# Patient Record
Sex: Female | Born: 1945 | Race: White | Hispanic: No | Smoking: Never smoker
Health system: Southern US, Community
[De-identification: ages and names within clinical notes are randomized; demographics above are authoritative.]

## PROBLEM LIST (undated history)

## (undated) DIAGNOSIS — N189 Chronic kidney disease, unspecified: Secondary | ICD-10-CM

## (undated) DIAGNOSIS — D333 Benign neoplasm of cranial nerves: Secondary | ICD-10-CM

## (undated) DIAGNOSIS — D496 Neoplasm of unspecified behavior of brain: Secondary | ICD-10-CM

## (undated) DIAGNOSIS — I10 Essential (primary) hypertension: Secondary | ICD-10-CM

## (undated) DIAGNOSIS — M810 Age-related osteoporosis without current pathological fracture: Secondary | ICD-10-CM

## (undated) DIAGNOSIS — K219 Gastro-esophageal reflux disease without esophagitis: Secondary | ICD-10-CM

## (undated) HISTORY — PX: FRACTURE SURGERY: SHX138

---
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
## 2005-09-18 ENCOUNTER — Ambulatory Visit: Payer: Self-pay | Admitting: Nurse Practitioner

## 2006-08-29 ENCOUNTER — Ambulatory Visit: Payer: Self-pay

## 2008-06-12 ENCOUNTER — Emergency Department: Payer: Self-pay | Admitting: Emergency Medicine

## 2009-04-16 ENCOUNTER — Ambulatory Visit: Payer: Self-pay | Admitting: Internal Medicine

## 2009-05-08 ENCOUNTER — Inpatient Hospital Stay: Payer: Self-pay | Admitting: Internal Medicine

## 2009-05-17 ENCOUNTER — Ambulatory Visit: Payer: Self-pay | Admitting: Internal Medicine

## 2009-05-26 DIAGNOSIS — D473 Essential (hemorrhagic) thrombocythemia: Secondary | ICD-10-CM | POA: Insufficient documentation

## 2009-05-26 HISTORY — DX: Essential (hemorrhagic) thrombocythemia: D47.3

## 2009-08-17 ENCOUNTER — Ambulatory Visit: Payer: Self-pay | Admitting: Internal Medicine

## 2009-08-18 ENCOUNTER — Ambulatory Visit: Payer: Self-pay | Admitting: Internal Medicine

## 2009-09-14 ENCOUNTER — Ambulatory Visit: Payer: Self-pay | Admitting: Internal Medicine

## 2011-12-15 DIAGNOSIS — H9190 Unspecified hearing loss, unspecified ear: Secondary | ICD-10-CM

## 2011-12-15 HISTORY — DX: Unspecified hearing loss, unspecified ear: H91.90

## 2011-12-23 LAB — TROPONIN I: Troponin-I: <0.02 ng/mL

## 2011-12-23 LAB — URINALYSIS, COMPLETE
Bilirubin,UR: NEGATIVE
Glucose,UR: NEGATIVE mg/dL (ref 0–75)
Hyaline Cast: 9
Leukocyte Esterase: NEGATIVE
Nitrite: NEGATIVE
Ph: 5 (ref 4.5–8.0)
Protein: 30
RBC,UR: 1 /HPF (ref 0–5)

## 2011-12-23 LAB — COMPREHENSIVE METABOLIC PANEL
Albumin: 4.2 g/dL (ref 3.4–5.0)
Alkaline Phosphatase: 138 U/L — ABNORMAL HIGH (ref 50–136)
Anion Gap: 11 (ref 7–16)
Bilirubin,Total: 0.4 mg/dL (ref 0.2–1.0)
EGFR (African American): 29 — ABNORMAL LOW
EGFR (Non-African Amer.): 25 — ABNORMAL LOW
Glucose: 112 mg/dL — ABNORMAL HIGH (ref 65–99)
Osmolality: 257 (ref 275–301)
Potassium: 4.5 mmol/L (ref 3.5–5.1)
Total Protein: 8.1 g/dL (ref 6.4–8.2)

## 2011-12-23 LAB — CBC
HCT: 43.2 % (ref 35.0–47.0)
MCHC: 33 g/dL (ref 32.0–36.0)
MCV: 89 fL (ref 80–100)
Platelet: 206 10*3/uL (ref 150–440)
RBC: 4.88 10*6/uL (ref 3.80–5.20)
RDW: 14.4 % (ref 11.5–14.5)
WBC: 5.1 10*3/uL (ref 3.6–11.0)

## 2011-12-23 LAB — PROTIME-INR
INR: 4.5
Prothrombin Time: 42.3 secs — ABNORMAL HIGH (ref 11.5–14.7)

## 2011-12-24 ENCOUNTER — Observation Stay: Payer: Self-pay | Admitting: Internal Medicine

## 2011-12-24 LAB — COMPREHENSIVE METABOLIC PANEL
Albumin: 3.4 g/dL (ref 3.4–5.0)
Alkaline Phosphatase: 103 U/L (ref 50–136)
Anion Gap: 13 (ref 7–16)
BUN: 23 mg/dL — ABNORMAL HIGH (ref 7–18)
Bilirubin,Total: 0.4 mg/dL (ref 0.2–1.0)
Calcium, Total: 8.3 mg/dL — ABNORMAL LOW (ref 8.5–10.1)
Co2: 17 mmol/L — ABNORMAL LOW (ref 21–32)
EGFR (African American): 39 — ABNORMAL LOW
EGFR (Non-African Amer.): 34 — ABNORMAL LOW
Glucose: 73 mg/dL (ref 65–99)
Osmolality: 271 (ref 275–301)
Potassium: 4 mmol/L (ref 3.5–5.1)
SGOT(AST): 31 U/L (ref 15–37)
Sodium: 134 mmol/L — ABNORMAL LOW (ref 136–145)

## 2011-12-24 LAB — AMMONIA: Ammonia, Plasma: <25 mcmol/L (ref 11–32)

## 2011-12-24 LAB — TROPONIN I
Troponin-I: <0.02 ng/mL
Troponin-I: <0.02 ng/mL

## 2011-12-24 LAB — CK TOTAL AND CKMB (NOT AT ARMC)
CK, Total: 290 U/L — ABNORMAL HIGH (ref 21–215)
CK, Total: 301 U/L — ABNORMAL HIGH (ref 21–215)
CK-MB: 8.2 ng/mL — ABNORMAL HIGH (ref 0.5–3.6)
CK-MB: 8.2 ng/mL — ABNORMAL HIGH (ref 0.5–3.6)

## 2011-12-24 LAB — CBC WITH DIFFERENTIAL/PLATELET
Eosinophil #: 0.1 10*3/uL (ref 0.0–0.7)
Eosinophil %: 1.4 %
HCT: 37.7 % (ref 35.0–47.0)
HGB: 12.7 g/dL (ref 12.0–16.0)
Lymphocyte #: 1.4 10*3/uL (ref 1.0–3.6)
MCH: 30.1 pg (ref 26.0–34.0)
MCHC: 33.7 g/dL (ref 32.0–36.0)
Monocyte #: 0.3 x10 3/mm (ref 0.2–0.9)
Neutrophil #: 2.2 10*3/uL (ref 1.4–6.5)
Neutrophil %: 54.4 %
WBC: 4 10*3/uL (ref 3.6–11.0)

## 2011-12-24 LAB — APTT: Activated PTT: 58.5 secs — ABNORMAL HIGH (ref 23.6–35.9)

## 2011-12-24 LAB — PROTIME-INR
INR: 4.8
Prothrombin Time: 44.9 secs — ABNORMAL HIGH (ref 11.5–14.7)

## 2011-12-24 LAB — MAGNESIUM: Magnesium: 1.7 mg/dL — ABNORMAL LOW

## 2011-12-24 LAB — ETHANOL
Ethanol %: <0.003 % (ref 0.000–0.080)
Ethanol: <3 mg/dL

## 2011-12-24 LAB — TSH: Thyroid Stimulating Horm: 1.44 u[IU]/mL

## 2011-12-25 LAB — BASIC METABOLIC PANEL
BUN: 13 mg/dL (ref 7–18)
Chloride: 108 mmol/L — ABNORMAL HIGH (ref 98–107)
Co2: 21 mmol/L (ref 21–32)
EGFR (African American): 51 — ABNORMAL LOW
EGFR (Non-African Amer.): 44 — ABNORMAL LOW
Glucose: 78 mg/dL (ref 65–99)
Osmolality: 271 (ref 275–301)
Potassium: 4.1 mmol/L (ref 3.5–5.1)
Sodium: 136 mmol/L (ref 136–145)

## 2011-12-25 LAB — AMMONIA: Ammonia, Plasma: <25 mcmol/L (ref 11–32)

## 2011-12-25 LAB — CBC WITH DIFFERENTIAL/PLATELET
Basophil #: 0 10*3/uL (ref 0.0–0.1)
Eosinophil %: 1.6 %
Lymphocyte #: 1.4 10*3/uL (ref 1.0–3.6)
Lymphocyte %: 34.4 %
MCH: 29.6 pg (ref 26.0–34.0)
Monocyte #: 0.3 x10 3/mm (ref 0.2–0.9)
Monocyte %: 8.5 %
Neutrophil #: 2.2 10*3/uL (ref 1.4–6.5)
Neutrophil %: 54.3 %
Platelet: 169 10*3/uL (ref 150–440)
RDW: 14.3 % (ref 11.5–14.5)
WBC: 4 10*3/uL (ref 3.6–11.0)

## 2011-12-25 LAB — PROTIME-INR: INR: 1.3

## 2011-12-25 LAB — CK-MB: CK-MB: 4.9 ng/mL — ABNORMAL HIGH (ref 0.5–3.6)

## 2011-12-27 LAB — PROTEIN ELECTROPHORESIS(ARMC)

## 2012-01-17 DIAGNOSIS — K7460 Unspecified cirrhosis of liver: Secondary | ICD-10-CM | POA: Insufficient documentation

## 2012-01-17 HISTORY — DX: Unspecified cirrhosis of liver: K74.60

## 2012-12-31 DIAGNOSIS — K5900 Constipation, unspecified: Secondary | ICD-10-CM | POA: Insufficient documentation

## 2012-12-31 HISTORY — DX: Constipation, unspecified: K59.00

## 2013-01-27 ENCOUNTER — Ambulatory Visit: Payer: Self-pay | Admitting: Family Medicine

## 2013-05-29 DIAGNOSIS — J309 Allergic rhinitis, unspecified: Secondary | ICD-10-CM

## 2013-05-29 HISTORY — DX: Allergic rhinitis, unspecified: J30.9

## 2013-07-18 DIAGNOSIS — E871 Hypo-osmolality and hyponatremia: Secondary | ICD-10-CM

## 2013-07-18 HISTORY — DX: Hypo-osmolality and hyponatremia: E87.1

## 2013-10-21 DIAGNOSIS — M5489 Other dorsalgia: Secondary | ICD-10-CM

## 2013-10-21 HISTORY — DX: Other dorsalgia: M54.89

## 2014-05-07 DIAGNOSIS — R195 Other fecal abnormalities: Secondary | ICD-10-CM

## 2014-05-07 HISTORY — DX: Other fecal abnormalities: R19.5

## 2014-11-08 NOTE — H&P (Signed)
PATIENT NAME:  Alexis Nichols, Alexis Nichols MR#:  [id number] DATE OF BIRTH:  [birth year]  DATE OF ADMISSION:  12/24/2011  PRIMARY CARE PHYSICIAN: [facility]   GASTROENTEROLOGIST: Gaylyn Cheers, MD  ED REFERRING PHYSICIAN: Lenise Arena, MD  HISTORY OF PRESENT ILLNESS: Mrs. Alexis Nichols is a 69 year old female with past medical history significant for hypothyroidism, liver cirrhosis, chronic kidney disease stage III, and history of contrast-induced nephropathy, who presents with complaint of lethargy and nausea for the past couple of weeks and was told to come to the emergency department by her primary care physician.  History is per the patient and her daughter who is at the bedside. The patient states that over the past couple of weeks she has been feeling a little tired, weak, and lethargic. She made an appointment to see her primary care physician on 12/15/2011 at the [facility]. She had a BMP and CBC drawn there. She was called earlier in the week and told that her kidney function was abnormal and she should come in to get a referral for a kidney doctor or either go to the ED due to her acute rise in her kidney function.  She did not tell her family members that she was supposed to go and see another doctor, in regards to her kidneys, and today she just continued to feel fatigued and lethargic at which point she told her daughter who decided to bring her in to the hospital. Upon arrival to the hospital, the patient was noted to be mentating fine. She got 2 liters of IV fluids. Her creatinine is noted to be elevated from 1.3 to 2.0, BUN is at 30, and she had elevation in her PT and PTT, which is abnormal for her.  She does have a history of liver cirrhosis and was seen in the past with elevated CA-125 and seen in the past by Dr. Vira Agar and Dr. Concepcion Elk. Hospitalist services were consulted for further inpatient workup and management. Her primary care physician is at the [facility]. Her primary GI physician is Dr.  Vira Agar.   PAST MEDICAL HISTORY:  1. Liver cirrhosis.  2. Hypothyroidism.  3. Tobacco abuse.  4. Alcohol abuse.  5. Chronic kidney disease. 6. Thrombocytopenia.   HOME MEDICATIONS:  1. Amoxicillin 500 mg started on 12/15/2011 by her PMD, one tablet three times daily.  2. Calcium plus vitamin D tablets, 2 tablets by mouth daily. 3. AcipHex 20 mg one tablet by mouth twice a day for acid reflux. 4. Triamcinolone acetonide 0.1% cream applied to affected area twice a day as needed.  PAST SURGICAL HISTORY: Left mole resection in approximately 2010.  DRUG ALLERGIES: No known drug allergies.   FAMILY HISTORY: Mother had uterine cancer, history of gastric ulcer. Father had history of asthma.   SOCIAL HISTORY: Quit smoking about 3 years ago, smoked a pack a day for about 40 years. Admits to drinking hard liquor, quit about 3 years ago and drank for a number of years.   REVIEW OF SYSTEMS: CONSTITUTIONAL: Denies any fevers. Admits of fatigue and weakness. No pain, weight loss, or weight gain. EYES: No blurred vision, double vision, pain, or redness. ENT: No tinnitus, ear pain, hearing loss, seasonal allergies, or epistaxis. RESPIRATORY: No cough, wheeze, hemoptysis, or dyspnea. CARDIOVASCULAR: No chest pain, orthopnea, edema, arrhythmias, or dyspnea on exertion. GASTROINTESTINAL: Positive nausea. No vomiting, diarrhea, abdominal pain, hematemesis, melena, or ulcers. Positive gastroesophageal reflux disease. GENITOURINARY: No dysuria or hematuria or renal calculi. ENDOCRINE: No polyuria or nocturia, History of  hypothyroidism. HEME/LYMPH: No anemia, easy bruising. Has low platelets. INTEGUMENT: No acne, rash, lesions, or change in mole, hair or skin. MUSCULOSKELETAL: No pain in the neck, shoulders, knees, or hips. Some mild back pain she complained of, otherwise no arthritis, cramps, swelling, or redness. NEURO: No numbness, weakness, dysarthria, tremor, vertigo, or ataxia. PSYCH: No anxiety, insomnia,  ADD, OCD, bipolar, or depression.   PHYSICAL EXAMINATION:   VITALS: Temperature 98.9, blood pressure 124/48, respirations 18, pulse 76, pulse oximetry 97% on room air.  GENERAL APPEARANCE: Well-developed female lying in bed, in no acute respiratory distress.   HEENT: Pupils are equally round and reactive to light. Extraocular movements are intact. No scleral icterus or conjunctivitis. No difficulty hearing. Mild dry mucous membranes. Otherwise good dentition.   NECK: No thyroid enlargement or nodules. Nontender thyroid. Neck is supple, nontender. No masses are palpated. No JVD.   LUNGS: Clear to auscultation bilaterally. No rales, rhonchi, or crackles. No diminished breath sounds.   HEART: Regular rate and regular rhythm. S1 and S2 auscultated. No PMI lateralization. No lower extremity edema. Good pedal pulses are noted. Chest is nontender.  ABDOMEN: Soft and nontender and nondistended. Positive bowel sounds. No hepatosplenomegaly palpated.   MUSCULOSKELETAL: 5/5 strength in bilateral upper and lower extremities. No cyanosis.   SKIN: No rash, lesions, erythema, or nodules. Skin is warm and dry.   LYMPH: No adenopathy noted in the cervical, axillary, or supraclavicular regions.   NEURO: Cranial nerves II through XII grossly intact. Deep tendon reflexes intact. Sensory is intact. She does have bilateral hearing deficit.   PSYCH: Alert and oriented to person, time, and place. Cooperative. Good judgment noted.   LABS/RADIOLOGIC STUDIES: Sodium 124, potassium 4.5, chloride 96, bicarbonate 17, BUN 30, creatinine 2.04, glucose 112. Estimated GFR 25.  Osmolality 257. Calcium 8.1. AST 37, ALT 20, alkaline phosphatase 138. White blood cell count 5.1, hemoglobin 14.3, hematocrit 43.2, platelet count 206. PT 42.3. INR 4.5.   Urinalysis is essentially negative.   CT of the head done for lethargy and altered mental status showed no acute intracranial process.  Chest x-ray showed no acute disease  of the chest.   EKG is pending.   ASSESSMENT AND PLAN: A 69 year old female with past medical history of chronic kidney disease stage III, hypothyroidism, liver cirrhosis, tobacco abuse, and alcohol abuse admitted for lethargy, mild mental status changes, and acute renal failure.  1. Acute renal failure. Differential diagnoses include dehydration, malnutrition, and possible alcohol abuse again. The patient has acute on chronic kidney disease, stage III. She does have a history of contrast-induced nephropathy about two years ago. Her creatinine did recover and she did have a baseline creatinine two years ago of about 1.3; however, it is elevated now to about 2.0. Again, the differential diagnosis includes dehydration and malnutrition. She did get 2 liters of normal saline in the ED. We will followup CMP in the morning and check a renal ultrasound. Also consult nephrology. We will start her on a regular diet and encourage p.o. intake.  2. Hyponatremia. This is acute on chronic. Based on review of her labs that we have here in the hospital, her trend in sodium is normal year around, 128 to 130. Currently it is at 124. Again, this is most likely secondary to malnutrition or possible reintroduction of alcohol. We will check an alcohol level and we will also check an abdominal ultrasound to monitor her liver status.  3. History of liver cirrhosis, per records, secondary to alcohol abuse in combination  with possible hepatitis B. We will check a CMP in the morning, followup with an abdominal ultrasound, and check Coags in the a.m.   4. History of hypothyroidism. Per home medication list, she is on Synthroid 100 mcg, but she has not taken it. We will check a TSH and a free T4. If there is abnormalities in her thyroid function tests, we will restart her on Synthroid. This could also be adding to her lethargy and her lab abnormalities.  5. Elevated INR. Again, this could be worsening liver disease causing the  coagulopathy that we are seeing and the elevated INR and PT. INR two years ago, when she was admitted to the hospital, was 0.9, in October 2010, and is now 4.5, which is about four times above her baseline limit. We will check another INR in the morning and consult gastroenterology also.   TIME SPENT DICTATING AND EVALUATING PATIENT: 45 minutes.  ____________________________ Vilinda Boehringer, MD vm:slb D: 12/24/2011 00:23:08 ET     T: 12/24/2011 11:45:12 ET        JOB#: [id number] cc: [facility] Vilinda Boehringer MD ELECTRONICALLY SIGNED 12/24/2011 14:15

## 2014-11-08 NOTE — Consult Note (Signed)
PATIENT NAME:  Alexis Nichols, INSCORE MR#:  [id number] DATE OF BIRTH:  14-Mar-1946  DATE OF CONSULTATION:  12/24/2011  REFERRING PHYSICIAN:   CONSULTING PHYSICIAN:  Manya Silvas, MD  HISTORY OF PRESENT ILLNESS: The patient is a 69 year old white female with a history of many years of alcoholism up until three years ago when she was admitted to the hospital and she has not drank any alcohol since then, according to the patient and her daughter. The patient currently lives by herself. She came to the ER after going to [facility] where she was complaining of feeling weak and lethargic and she had a serum sodium of 124. She was sent to the ER and after evaluation she was admitted to the hospital and I was asked to see her in consultation.   The patient has a history of previous hepatitis B with probably resolution of the disease. Records are unclear about this.   She has a history of esophageal stricture with dilatation with great success for swallowing, according to the patient. She is starting to have some swallowing difficulty again and feels like she needs her esophagus stretched again.   For screening, she has never had a colonoscopy.   The patient has a history of cirrhosis of the liver based on previous ultrasound and CAT scan. On previous endoscopy she had portal hypertensive gastropathy.   PAST MEDICAL HISTORY:  1. Hypothyroidism. 2. Tobacco abuse. 3. Alcohol abuse.  4. Chronic kidney disease.  5. Thrombocytopenia secondary to cirrhosis.   HOME MEDICATIONS:  1. AcipHex 20 mg twice a day for reflux.  2. Amoxicillin 500 mg 1 tablet three times daily, started on 12/15/2011, should be finished now.   PAST SURGICAL HISTORY: Left mole resection in 2010.  ALLERGIES: No known drug allergies.   FAMILY HISTORY: Mother with uterine cancer, history of gastric ulcer. Father with asthma.   HABITS: She quit smoking three years ago when she stopped drinking alcohol. She smoked a pack a day for  almost 40 years.  REVIEW OF SYSTEMS: Weakness and fatigue. Lethargy. Nausea. Gastroesophageal reflux disease and dysphagia. No hematemesis. No vomiting. No melena. No diarrhea. She does tend to be a little constipated. No exertional chest pains or skipping irregular heartbeats.   PHYSICAL EXAMINATION:   GENERAL: Elderly white female who is hard of hearing sitting up in bed eating lunch.  VITAL SIGNS: Temperature 98.1, pulse 66, blood pressure 111/65, and oxygen saturation 97%.   HEENT: Sclerae anicteric. Conjunctivae negative. Tongue negative. Head is atraumatic.   LUNGS: Chest is clear.   HEART: 1/6 systolic murmur.   ABDOMEN: Negative.   EXTREMITIES: No edema.   SKIN: Warm and dry.   PSYCH: Mood and affect are appropriate.  LABS/RADIOLOGIC STUDIES: Sodium 124, potassium 4.5, chloride 96, bicarbonate 17, BUN 30, creatinine 2, calcium 8.1. AST 37, ALT 20. White blood count 5.1, hemoglobin 14. PT 42.3 seconds with an INR of 4.5.   CT of the head done for lethargy and altered mental status showed no acute intracranial process.   Chest x-ray showed no disease.   ASSESSMENT/RECOMMENDATIONS: Hyponatremia and low bicarbonate with mild renal insufficiency. 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 She surprisingly has a normal liver panel yet a very elevated pro time. Her daughter and the patient both say that the patient has not resumed alcohol and she is supposedly not on any strange diet.   I have asked the daughter to go to the patient's clinic, at  [facility], and get a printout of her medications to be sure she was not started on Coumadin that we are not aware of. I also asked her to bring prescription bottles if at all possible.   For her previous hepatitis B, we will get a hepatitis B panel and be sure that she has cleared her surface antigen.   I will follow with you. Eventually she will need a repeat upper endoscopy and a  screening colonoscopy.  ____________________________ Manya Silvas, MD rte:slb D: 12/24/2011 13:07:44 ET T: 12/24/2011 13:50:54 ET JOB#: [id number]  cc: Manya Silvas, MD, <Dictator> Manya Silvas MD ELECTRONICALLY SIGNED 01/09/2012 16:06

## 2014-11-08 NOTE — Discharge Summary (Signed)
PATIENT NAME:  Alexis Nichols, Alexis Nichols MR#:  [id number] DATE OF BIRTH:  09-Jan-1946  DATE OF ADMISSION:  12/24/2011 DATE OF DISCHARGE:  12/25/2011  ADMITTING DIAGNOSES:  1. Generalized weakness. 2. Acute renal failure.   DISCHARGE DIAGNOSES:  1. Generalized weakness likely in the setting of hyponatremia and acute renal failure, due to volume depletion, now the patient's symptoms are improved.  2. Acute renal failure, on chronic renal failure, likely due to volume depletion. Now her renal function is improved. GFR is chronically low.  3. Hyponatremia. This was acute on chronic. The patient's sodium usually is around 129 now is normalized. 4. History of liver cirrhosis secondary to alcohol abuse in the past, none now.  5. History of hypothyroidism, not on any supplements. TSH is normal. She was taken off her medications because of thyroid function being normal.  6. Elevated INR, possibly related to lab error or could be related to dehydration, also could be related to vitamin K deficiency status post one dose of vitamin K with normalization of her INR.  7. Elevated CK-MB and CPK without any chest pain, status post echocardiogram that shows no evidence of wall motion abnormality. The patient is without any chest pain.  8. History of thrombocytopenia. Platelets during this hospitalization were normal.  9. Status post left mole resection in 2010.   CONSULTANTS: Gaylyn Cheers, MD - Gastroenterology.  PERTINENT LABS/EVALUATIONS: Sodium 124, potassium 4.5, chloride 96, bicarbonate 17, BUN 30, creatinine 2.04, glucose 112, calcium 8.1, AST 20, and alkaline phosphatase 138. WBC count 5.1, hemoglobin 14.3, hematocrit 43.2, and platelet count 206. INR 4.1.   Urinalysis was negative.   CT scan of the head showed no acute abnormality.   Chest x-ray showed no acute abnormality.   BUN and creatinine today are 13 and 1.27, sodium 136, chloride 108, and GFR 44. INR this morning is 1.3. Her CPK was 290, 301, 295,  and 205. CK-MB was 8.2, 9.9, and 8.2 and then subsequently 4.9. Also troponin was less than 0.02 x3. TSH 1.44. Free thyroxine 0.84.   Echocardiogram showed normal ejection fraction, poor quality study. There was no evidence of wall motion abnormality. Ejection fraction was normal.  Ultrasound of the abdomen showed liver is slightly heterogeneous in echotexture with increased echogenicity.   HOSPITAL COURSE: Please see the history and physical done by the admitting physician. The patient is a 69 year old white female with medical history significant for hypothyroidism, liver cirrhosis, chronic kidney disease stage III, and history of contrast-induced nephropathy who presented with complaint of lethargy and nausea for the past three weeks. The patient was noted to have an elevated creatinine, was noted to have hyponatremia, also had some other abnormalities including an elevated INR. Due to these symptoms, we were asked to admit the patient. The patient was given IV fluids. Nephrology consult was obtained. A renal ultrasound was negative. Her renal function normalized with IV fluids. Her sodium was also low. That also normalized with normal saline. The patient is feeling much better, ambulated well with physical therapy. At this point, she wants to go home. She is stable for discharge. She was also noted to have an elevated INR and initially felt that this could be related to her liver disease, however, her platelet count was normal. She was given a dose of vitamin K with her INR being 1.3, so unlikely related to her liver due to such quick correction. The patient also was noted to have an elevated CK-MB without any chest pain. She had an echo  which was nonrevealing. If she starts having chest pain, she does need cardiac evaluation with stress test. At this time, she is doing much better and is stable for discharge.   DISCHARGE MEDICATIONS:  1. Calcium plus vitamin D 2 tabs daily.  2. AcipHex 20 mg 1 tab  p.o. twice a day. 3. Triamcinolone apply to affected areas twice a day. 4. Sudafed 30 mg 1 tab p.o. twice a day. 5. Aspirin 325 mg p.o. daily.  6. Tylenol 650 mg p.o. every eight hours p.r.n., not to exceed 2 grams per day.  HOME OXYGEN: None.   DIET: Regular.   ACTIVITY: As tolerated.   TIMEFRAME FOR FOLLOW UP: Followup in 2 to 4 weeks with [facility]. At that time, the patient is to have a BMP check and INR check at time of the visit.  TIME SPENT: 35 minutes.  ____________________________ Lafonda Mosses Posey Pronto, MD shp:slb D: 12/25/2011 10:59:34 ET T: 12/26/2011 10:49:25 ET JOB#: [id number]  cc: Santita Hunsberger H. Posey Pronto, MD, <Dictator> Alric Seton MD ELECTRONICALLY SIGNED 01/03/2012 11:40

## 2015-04-20 DIAGNOSIS — R001 Bradycardia, unspecified: Secondary | ICD-10-CM | POA: Insufficient documentation

## 2015-04-20 HISTORY — DX: Bradycardia, unspecified: R00.1

## 2015-05-24 DIAGNOSIS — R001 Bradycardia, unspecified: Secondary | ICD-10-CM | POA: Insufficient documentation

## 2015-05-24 DIAGNOSIS — I059 Rheumatic mitral valve disease, unspecified: Secondary | ICD-10-CM | POA: Insufficient documentation

## 2015-07-07 DIAGNOSIS — R000 Tachycardia, unspecified: Secondary | ICD-10-CM | POA: Insufficient documentation

## 2015-10-26 DIAGNOSIS — Z1231 Encounter for screening mammogram for malignant neoplasm of breast: Secondary | ICD-10-CM | POA: Insufficient documentation

## 2015-10-26 HISTORY — DX: Encounter for screening mammogram for malignant neoplasm of breast: Z12.31

## 2015-10-27 ENCOUNTER — Other Ambulatory Visit: Payer: Self-pay | Admitting: Family Medicine

## 2015-10-27 DIAGNOSIS — Z1231 Encounter for screening mammogram for malignant neoplasm of breast: Secondary | ICD-10-CM

## 2016-05-05 ENCOUNTER — Ambulatory Visit
Admission: RE | Admit: 2016-05-05 | Discharge: 2016-05-05 | Disposition: A | Discharge status: Home / Self Care | Payer: Medicare Other | Source: Ambulatory Visit | Attending: Family Medicine | Admitting: Family Medicine

## 2016-05-05 ENCOUNTER — Encounter: Payer: Self-pay | Admitting: Radiology

## 2016-05-05 DIAGNOSIS — Z1231 Encounter for screening mammogram for malignant neoplasm of breast: Secondary | ICD-10-CM | POA: Diagnosis present

## 2017-04-27 ENCOUNTER — Other Ambulatory Visit: Payer: Self-pay | Admitting: Family Medicine

## 2017-04-27 DIAGNOSIS — Z1231 Encounter for screening mammogram for malignant neoplasm of breast: Secondary | ICD-10-CM

## 2017-05-01 ENCOUNTER — Other Ambulatory Visit: Payer: Self-pay | Admitting: Family Medicine

## 2017-05-01 DIAGNOSIS — Z1231 Encounter for screening mammogram for malignant neoplasm of breast: Secondary | ICD-10-CM

## 2017-05-01 DIAGNOSIS — Z13820 Encounter for screening for osteoporosis: Secondary | ICD-10-CM

## 2017-06-04 ENCOUNTER — Other Ambulatory Visit: Payer: Medicare Other

## 2017-07-18 ENCOUNTER — Other Ambulatory Visit: Payer: Medicare Other

## 2017-12-03 ENCOUNTER — Other Ambulatory Visit: Payer: Self-pay | Admitting: Unknown Physician Specialty

## 2017-12-03 DIAGNOSIS — H9192 Unspecified hearing loss, left ear: Secondary | ICD-10-CM

## 2017-12-18 ENCOUNTER — Ambulatory Visit
Admission: RE | Admit: 2017-12-18 | Discharge: 2017-12-18 | Disposition: A | Discharge status: Home / Self Care | Payer: Medicare HMO | Source: Ambulatory Visit | Attending: Unknown Physician Specialty | Admitting: Unknown Physician Specialty

## 2017-12-18 DIAGNOSIS — G319 Degenerative disease of nervous system, unspecified: Secondary | ICD-10-CM | POA: Diagnosis not present

## 2017-12-18 DIAGNOSIS — I739 Peripheral vascular disease, unspecified: Secondary | ICD-10-CM | POA: Diagnosis not present

## 2017-12-18 DIAGNOSIS — D333 Benign neoplasm of cranial nerves: Secondary | ICD-10-CM | POA: Insufficient documentation

## 2017-12-18 DIAGNOSIS — H9192 Unspecified hearing loss, left ear: Secondary | ICD-10-CM | POA: Diagnosis not present

## 2017-12-18 LAB — POCT I-STAT CREATININE: Creatinine, Ser: 1.2 mg/dL — ABNORMAL HIGH (ref 0.44–1.00)

## 2017-12-18 MED ORDER — GADOBENATE DIMEGLUMINE 529 MG/ML IV SOLN: 15.0000 mL | Freq: Once | INTRAVENOUS | Status: DC | PRN

## 2017-12-18 MED ORDER — GADOBENATE DIMEGLUMINE 529 MG/ML IV SOLN
10.0000 mL | Freq: Once | INTRAVENOUS | Status: AC | PRN
  Administered 2017-12-18: 10 mL via INTRAVENOUS

## 2018-05-27 ENCOUNTER — Other Ambulatory Visit: Payer: Self-pay | Admitting: Family Medicine

## 2018-05-27 DIAGNOSIS — Z13820 Encounter for screening for osteoporosis: Secondary | ICD-10-CM

## 2018-05-27 DIAGNOSIS — Z1231 Encounter for screening mammogram for malignant neoplasm of breast: Secondary | ICD-10-CM

## 2018-07-31 DIAGNOSIS — D333 Benign neoplasm of cranial nerves: Secondary | ICD-10-CM | POA: Insufficient documentation

## 2018-07-31 HISTORY — DX: Benign neoplasm of cranial nerves: D33.3

## 2018-11-15 HISTORY — PX: BRAIN TUMOR EXCISION: SHX577

## 2018-12-25 ENCOUNTER — Other Ambulatory Visit: Payer: Self-pay

## 2019-02-10 ENCOUNTER — Other Ambulatory Visit: Payer: Self-pay

## 2019-02-10 ENCOUNTER — Encounter: Payer: Self-pay | Admitting: Urology

## 2019-02-10 ENCOUNTER — Ambulatory Visit (INDEPENDENT_AMBULATORY_CARE_PROVIDER_SITE_OTHER): Payer: Medicare HMO | Admitting: Urology

## 2019-02-10 VITALS — BP 135/74 | HR 83 | Ht 63.0 in | Wt 117.0 lb

## 2019-02-10 DIAGNOSIS — R32 Unspecified urinary incontinence: Secondary | ICD-10-CM

## 2019-02-10 DIAGNOSIS — N3946 Mixed incontinence: Secondary | ICD-10-CM | POA: Diagnosis not present

## 2019-02-10 LAB — URINALYSIS, COMPLETE
Bilirubin, UA: NEGATIVE
Glucose, UA: NEGATIVE
Ketones, UA: NEGATIVE
Nitrite, UA: NEGATIVE
Protein,UA: NEGATIVE
Specific Gravity, UA: 1.01 (ref 1.005–1.030)
Urobilinogen, Ur: 0.2 mg/dL (ref 0.2–1.0)
pH, UA: 5.5 (ref 5.0–7.5)

## 2019-02-10 LAB — MICROSCOPIC EXAMINATION

## 2019-02-10 NOTE — Progress Notes (Signed)
02/10/2019 2:04 PM   Aletha Halim 1946-07-02 [id number]  Referring provider: Marguerita Merles, [address] [place],  [place] [address]  No chief complaint on file.   HPI: I was consulted to assess the patient is urinary incontinence.  She leaks with coughing sneezing and used to with laughing and almost all the time.  She has urge incontinence.  She had bedwetting.  She is now on Vesicare 10 mg and is dramatically better wearing 1 pad a day moderately wet.  She might have a little bit of dampness while she sleeping or she is dry.  Some of the details were difficult  She voids once or twice at night and every 1 or 2 hours during the day  No neurologic issues.  No GU surgery.  No kidney stones or bladder infection history.  No hysterectomy.  She has had some issues with constipation  Modifying factors: There are no other modifying factors  Associated signs and symptoms: There are no other associated signs and symptoms Aggravating and relieving factors: There are no other aggravating or relieving factors Severity: Moderate Duration: Persistent   PMH: No past medical history on file.  Surgical History: No past surgical history on file.  Home Medications:  Allergies as of 02/10/2019   Not on File     Medication List    as of February 10, 2019  2:04 PM   You have not been prescribed any medications.     Allergies: Not on File  Family History: No family history on file.  Social History:  has no history on file for tobacco, alcohol, and drug.  ROS: UROLOGY Frequent Urination?: Yes Hard to postpone urination?: Yes Burning/pain with urination?: Yes Get up at night to urinate?: Yes Leakage of urine?: Yes                                      Physical Exam: There were no vitals taken for this visit.  Constitutional:  Alert and oriented, No acute distress. HEENT: [place] AT, moist mucus membranes.  Trachea midline, no masses. Cardiovascular: No  clubbing, cyanosis, or edema. Respiratory: Normal respiratory effort, no increased work of breathing. GI: Abdomen is soft, nontender, nondistended, no abdominal masses GU: Grade 1 hypermobility the bladder neck and negative cough test.  No prolapse Skin: No rashes, bruises or suspicious lesions. Lymph: No cervical or inguinal adenopathy. Neurologic: Grossly intact, no focal deficits, moving all 4 extremities. Psychiatric: Normal mood and affect.  Laboratory Data: Lab Results  Component Value Date   WBC 4.0 12/25/2011   HGB 12.2 12/25/2011   HCT 37.0 12/25/2011   MCV 90 12/25/2011   PLT 169 12/25/2011    Lab Results  Component Value Date   CREATININE 1.20 (H) 12/18/2017    No results found for: PSA  No results found for: TESTOSTERONE  No results found for: HGBA1C  Urinalysis    Component Value Date/Time   COLORURINE Yellow 12/23/2011 2021   APPEARANCEUR Clear 12/23/2011 2021   LABSPEC 1.014 12/23/2011 2021   PHURINE 5.0 12/23/2011 2021   GLUCOSEU Negative 12/23/2011 2021   HGBUR 1+ 12/23/2011 2021   BILIRUBINUR Negative 12/23/2011 2021   KETONESUR 1+ 12/23/2011 2021   PROTEINUR 30 mg/dL 12/23/2011 2021   NITRITE Negative 12/23/2011 2021   LEUKOCYTESUR Negative 12/23/2011 2021    Pertinent Imaging:   Assessment & Plan: 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  She has milder frequency and nocturia but really has done well on solifenacin 5 mg and now 10 mg.  I am not convinced that changing medication would be beneficial.  She has intermittent low back pain I think not related.  No ultrasound ordered.  Reassurance given  1. Urinary incontinence, unspecified type  - Urinalysis, Complete - Bladder Scan (Post Void Residual) in office   No follow-ups on file.  Reece Packer, MD  [facility] [address], [place], [place] [address] [phone]

## 2019-07-11 ENCOUNTER — Inpatient Hospital Stay
Admission: RE | Admit: 2019-07-11 | Payer: Medicare HMO | Source: Other Acute Inpatient Hospital | Admitting: Orthopedic Surgery

## 2019-07-11 ENCOUNTER — Emergency Department: Payer: Medicare HMO

## 2019-07-11 ENCOUNTER — Emergency Department
Admission: EM | Admit: 2019-07-11 | Discharge: 2019-07-11 | Disposition: A | Discharge status: Other Acute Inpatient Hospital | Payer: Medicare HMO | Attending: Emergency Medicine | Admitting: Emergency Medicine

## 2019-07-11 ENCOUNTER — Inpatient Hospital Stay (HOSPITAL_COMMUNITY)
Admission: AD | Admit: 2019-07-11 | Discharge: 2019-07-24 | DRG: 493 | Disposition: A | Discharge status: Skilled Nursing Facility | Payer: Medicare HMO | Source: Other Acute Inpatient Hospital | Attending: Orthopedic Surgery | Admitting: Orthopedic Surgery

## 2019-07-11 ENCOUNTER — Other Ambulatory Visit: Payer: Self-pay

## 2019-07-11 ENCOUNTER — Encounter: Payer: Self-pay | Admitting: Emergency Medicine

## 2019-07-11 DIAGNOSIS — Z751 Person awaiting admission to adequate facility elsewhere: Secondary | ICD-10-CM

## 2019-07-11 DIAGNOSIS — Z7982 Long term (current) use of aspirin: Secondary | ICD-10-CM | POA: Diagnosis not present

## 2019-07-11 DIAGNOSIS — S82102A Unspecified fracture of upper end of left tibia, initial encounter for closed fracture: Secondary | ICD-10-CM | POA: Diagnosis not present

## 2019-07-11 DIAGNOSIS — S82202A Unspecified fracture of shaft of left tibia, initial encounter for closed fracture: Secondary | ICD-10-CM | POA: Diagnosis present

## 2019-07-11 DIAGNOSIS — D333 Benign neoplasm of cranial nerves: Secondary | ICD-10-CM

## 2019-07-11 DIAGNOSIS — W19XXXA Unspecified fall, initial encounter: Secondary | ICD-10-CM

## 2019-07-11 DIAGNOSIS — Y929 Unspecified place or not applicable: Secondary | ICD-10-CM | POA: Insufficient documentation

## 2019-07-11 DIAGNOSIS — K219 Gastro-esophageal reflux disease without esophagitis: Secondary | ICD-10-CM | POA: Diagnosis present

## 2019-07-11 DIAGNOSIS — Y999 Unspecified external cause status: Secondary | ICD-10-CM | POA: Diagnosis not present

## 2019-07-11 DIAGNOSIS — Y9389 Activity, other specified: Secondary | ICD-10-CM | POA: Diagnosis not present

## 2019-07-11 DIAGNOSIS — M810 Age-related osteoporosis without current pathological fracture: Secondary | ICD-10-CM | POA: Diagnosis present

## 2019-07-11 DIAGNOSIS — S82142A Displaced bicondylar fracture of left tibia, initial encounter for closed fracture: Secondary | ICD-10-CM | POA: Diagnosis present

## 2019-07-11 DIAGNOSIS — Z20822 Contact with and (suspected) exposure to covid-19: Secondary | ICD-10-CM | POA: Diagnosis present

## 2019-07-11 DIAGNOSIS — Z419 Encounter for procedure for purposes other than remedying health state, unspecified: Secondary | ICD-10-CM

## 2019-07-11 DIAGNOSIS — Y92009 Unspecified place in unspecified non-institutional (private) residence as the place of occurrence of the external cause: Secondary | ICD-10-CM | POA: Diagnosis not present

## 2019-07-11 DIAGNOSIS — N189 Chronic kidney disease, unspecified: Secondary | ICD-10-CM | POA: Diagnosis present

## 2019-07-11 DIAGNOSIS — W010XXA Fall on same level from slipping, tripping and stumbling without subsequent striking against object, initial encounter: Secondary | ICD-10-CM | POA: Diagnosis present

## 2019-07-11 DIAGNOSIS — Z79899 Other long term (current) drug therapy: Secondary | ICD-10-CM

## 2019-07-11 DIAGNOSIS — T148XXA Other injury of unspecified body region, initial encounter: Secondary | ICD-10-CM

## 2019-07-11 DIAGNOSIS — Z9181 History of falling: Secondary | ICD-10-CM

## 2019-07-11 DIAGNOSIS — Z7409 Other reduced mobility: Secondary | ICD-10-CM

## 2019-07-11 DIAGNOSIS — Z20828 Contact with and (suspected) exposure to other viral communicable diseases: Secondary | ICD-10-CM | POA: Diagnosis not present

## 2019-07-11 DIAGNOSIS — Z87891 Personal history of nicotine dependence: Secondary | ICD-10-CM

## 2019-07-11 DIAGNOSIS — S8992XA Unspecified injury of left lower leg, initial encounter: Secondary | ICD-10-CM | POA: Diagnosis present

## 2019-07-11 DIAGNOSIS — K7460 Unspecified cirrhosis of liver: Secondary | ICD-10-CM | POA: Diagnosis present

## 2019-07-11 DIAGNOSIS — I129 Hypertensive chronic kidney disease with stage 1 through stage 4 chronic kidney disease, or unspecified chronic kidney disease: Secondary | ICD-10-CM | POA: Diagnosis present

## 2019-07-11 DIAGNOSIS — S82832A Other fracture of upper and lower end of left fibula, initial encounter for closed fracture: Secondary | ICD-10-CM | POA: Diagnosis not present

## 2019-07-11 DIAGNOSIS — Z86011 Personal history of benign neoplasm of the brain: Secondary | ICD-10-CM

## 2019-07-11 DIAGNOSIS — E559 Vitamin D deficiency, unspecified: Secondary | ICD-10-CM | POA: Diagnosis present

## 2019-07-11 DIAGNOSIS — I10 Essential (primary) hypertension: Secondary | ICD-10-CM | POA: Diagnosis present

## 2019-07-11 DIAGNOSIS — D62 Acute posthemorrhagic anemia: Secondary | ICD-10-CM | POA: Diagnosis not present

## 2019-07-11 HISTORY — DX: Gastro-esophageal reflux disease without esophagitis: K21.9

## 2019-07-11 HISTORY — DX: Benign neoplasm of cranial nerves: D33.3

## 2019-07-11 HISTORY — DX: Essential (primary) hypertension: I10

## 2019-07-11 HISTORY — DX: Chronic kidney disease, unspecified: N18.9

## 2019-07-11 HISTORY — DX: Age-related osteoporosis without current pathological fracture: M81.0

## 2019-07-11 LAB — BASIC METABOLIC PANEL
Anion gap: 5 (ref 5–15)
BUN: 13 mg/dL (ref 8–23)
CO2: 29 mmol/L (ref 22–32)
Calcium: 9.4 mg/dL (ref 8.9–10.3)
Chloride: 104 mmol/L (ref 98–111)
Creatinine, Ser: 1.08 mg/dL — ABNORMAL HIGH (ref 0.44–1.00)
GFR calc Af Amer: 59 mL/min — ABNORMAL LOW (ref >60)
GFR calc non Af Amer: 51 mL/min — ABNORMAL LOW (ref >60)
Glucose, Bld: 115 mg/dL — ABNORMAL HIGH (ref 70–99)
Potassium: 4.2 mmol/L (ref 3.5–5.1)
Sodium: 138 mmol/L (ref 135–145)

## 2019-07-11 LAB — SARS CORONAVIRUS 2 (TAT 6-24 HRS): SARS Coronavirus 2: NEGATIVE

## 2019-07-11 LAB — CBC WITH DIFFERENTIAL/PLATELET
Abs Immature Granulocytes: 0.07 10*3/uL (ref 0.00–0.07)
Basophils Absolute: 0.1 10*3/uL (ref 0.0–0.1)
Basophils Relative: 0 %
Eosinophils Absolute: 0 10*3/uL (ref 0.0–0.5)
Eosinophils Relative: 0 %
HCT: 33.1 % — ABNORMAL LOW (ref 36.0–46.0)
Hemoglobin: 10.8 g/dL — ABNORMAL LOW (ref 12.0–15.0)
Immature Granulocytes: 1 %
Lymphocytes Relative: 10 %
Lymphs Abs: 1.6 10*3/uL (ref 0.7–4.0)
MCH: 26.4 pg (ref 26.0–34.0)
MCHC: 32.6 g/dL (ref 30.0–36.0)
MCV: 80.9 fL (ref 80.0–100.0)
Monocytes Absolute: 0.9 10*3/uL (ref 0.1–1.0)
Monocytes Relative: 6 %
Neutro Abs: 12.6 10*3/uL — ABNORMAL HIGH (ref 1.7–7.7)
Neutrophils Relative %: 83 %
Platelets: 272 10*3/uL (ref 150–400)
RBC: 4.09 MIL/uL (ref 3.87–5.11)
RDW: 14.8 % (ref 11.5–15.5)
WBC: 15.2 10*3/uL — ABNORMAL HIGH (ref 4.0–10.5)
nRBC: 0 % (ref 0.0–0.2)

## 2019-07-11 MED ORDER — DOCUSATE SODIUM 100 MG PO CAPS
100.0000 mg | ORAL_CAPSULE | Freq: Two times a day (BID) | ORAL | Status: DC
  Administered 2019-07-11: 100 mg via ORAL
  Filled 2019-07-11: qty 1

## 2019-07-11 MED ORDER — METOPROLOL TARTRATE 25 MG PO TABS
25.0000 mg | ORAL_TABLET | Freq: Every day | ORAL | Status: DC
  Administered 2019-07-11 – 2019-07-24 (×6): 25 mg via ORAL
  Filled 2019-07-11 (×11): qty 1

## 2019-07-11 MED ORDER — OXYCODONE-ACETAMINOPHEN 5-325 MG PO TABS
1.0000 | ORAL_TABLET | Freq: Once | ORAL | Status: AC
  Administered 2019-07-11: 1 via ORAL
  Filled 2019-07-11: qty 1

## 2019-07-11 MED ORDER — METHOCARBAMOL 500 MG PO TABS
500.0000 mg | ORAL_TABLET | Freq: Four times a day (QID) | ORAL | Status: DC | PRN
  Administered 2019-07-12: 500 mg via ORAL
  Filled 2019-07-11: qty 1

## 2019-07-11 MED ORDER — DIPHENHYDRAMINE HCL 12.5 MG/5ML PO ELIX: 12.5000 mg | ORAL_SOLUTION | ORAL | Status: DC | PRN

## 2019-07-11 MED ORDER — MORPHINE SULFATE (PF) 4 MG/ML IV SOLN
4.0000 mg | Freq: Once | INTRAVENOUS | Status: AC
  Administered 2019-07-11: 10:00:00 4 mg via INTRAMUSCULAR
  Filled 2019-07-11: qty 1

## 2019-07-11 MED ORDER — ONDANSETRON HCL 4 MG PO TABS: 4.0000 mg | ORAL_TABLET | Freq: Four times a day (QID) | ORAL | Status: DC | PRN

## 2019-07-11 MED ORDER — SPIRONOLACTONE 25 MG PO TABS
50.0000 mg | ORAL_TABLET | Freq: Every day | ORAL | Status: DC
  Administered 2019-07-11 – 2019-07-24 (×11): 50 mg via ORAL
  Filled 2019-07-11 (×12): qty 2

## 2019-07-11 MED ORDER — ACETAMINOPHEN 500 MG PO TABS
1000.0000 mg | ORAL_TABLET | Freq: Three times a day (TID) | ORAL | Status: DC
  Administered 2019-07-11 – 2019-07-12 (×2): 1000 mg via ORAL
  Filled 2019-07-11 (×2): qty 2

## 2019-07-11 MED ORDER — DARIFENACIN HYDROBROMIDE ER 7.5 MG PO TB24
7.5000 mg | ORAL_TABLET | Freq: Every day | ORAL | Status: DC
  Administered 2019-07-11 – 2019-07-24 (×13): 7.5 mg via ORAL
  Filled 2019-07-11 (×15): qty 1

## 2019-07-11 MED ORDER — POLYETHYLENE GLYCOL 3350 17 G PO PACK: 17.0000 g | PACK | Freq: Every day | ORAL | Status: DC | PRN

## 2019-07-11 MED ORDER — HYDROMORPHONE HCL 1 MG/ML IJ SOLN: 0.5000 mg | INTRAMUSCULAR | Status: DC | PRN

## 2019-07-11 MED ORDER — ONDANSETRON HCL 4 MG/2ML IJ SOLN: 4.0000 mg | Freq: Four times a day (QID) | INTRAMUSCULAR | Status: DC | PRN

## 2019-07-11 MED ORDER — OXYCODONE HCL 5 MG PO TABS
5.0000 mg | ORAL_TABLET | ORAL | Status: DC | PRN
  Administered 2019-07-11 – 2019-07-12 (×2): 10 mg via ORAL
  Filled 2019-07-11 (×2): qty 2

## 2019-07-11 MED ORDER — LACTATED RINGERS IV SOLN
50.0000 mL/h | INTRAVENOUS | Status: DC
  Administered 2019-07-11 – 2019-07-12 (×2): 50 mL/h via INTRAVENOUS

## 2019-07-11 MED ORDER — METHOCARBAMOL 1000 MG/10ML IJ SOLN
500.0000 mg | Freq: Four times a day (QID) | INTRAVENOUS | Status: DC | PRN
  Filled 2019-07-11: qty 5

## 2019-07-11 MED ORDER — PANTOPRAZOLE SODIUM 40 MG PO TBEC
80.0000 mg | DELAYED_RELEASE_TABLET | Freq: Every day | ORAL | Status: DC
  Administered 2019-07-11 – 2019-07-24 (×12): 80 mg via ORAL
  Filled 2019-07-11 (×12): qty 2

## 2019-07-11 NOTE — H&P (Signed)
ORTHOPAEDIC ADMISSION H+P  Chief Complaint: fall  HPI: Alexis Nichols is a 73 y.o. female with past medical history significant for vestibular schwannoma status post resection, CKD, and cirrhosis. Patient slipped on a dog bed which caused her to fall onto her left leg. She heard a pop in her knee and felt immediate pain. She was taken to [facility] Emergency Department by EMS. X-ray shows comminuted fracture of left tib-fib extending proximally towards her knee. CT scan was then obtained and showed comminuted fracture of proximal tibia with involvement of tibial plateau and depressed fracture. Patient was transferred to [facility] for further evaluation.   Patient resides at home by herself. Her youngest daughter lives about 10 minutes away. She normally ambulates without any assistance. She has access to a wheelchair, walker, and cane at home from her late husband. Upon examination, patient notes her pain has improved after IV pain medications. She is resting comfortably in her hospital bed. She denies any injury or pain to any other extremities. She denies any previous injury to her left leg. She had a vestibular schwannoma resected in May of this year.   Past Medical History:  Diagnosis Date  . [facility]    Past Surgical History:  Procedure Laterality Date  . BRAIN TUMOR EXCISION  11/2018   Social History   Socioeconomic History  . Marital status: Widowed    Spouse name: Not on file  . Number of children: Not on file  . Years of education: Not on file  . Highest education level: Not on file  Occupational History  . Not on file  Tobacco Use  . Smoking status: Never Smoker  . Smokeless tobacco: Never Used  Substance and Sexual Activity  . Alcohol use: Never  . Drug use: Not on file  . Sexual activity: Not on file  Other Topics Concern  . Not on file  Social History Narrative  . Not on file   Social Determinants of Health   Financial Resource Strain:   .  Difficulty of Paying Living Expenses: Not on file  Food Insecurity:   . Worried About Charity fundraiser in the Last Year: Not on file  . Ran Out of Food in the Last Year: Not on file  Transportation Needs:   . Lack of Transportation (Medical): Not on file  . Lack of Transportation (Non-Medical): Not on file  Physical Activity:   . Days of Exercise per Week: Not on file  . Minutes of Exercise per Session: Not on file  Stress:   . Feeling of Stress : Not on file  Social Connections:   . Frequency of Communication with Friends and Family: Not on file  . Frequency of Social Gatherings with Friends and Family: Not on file  . Attends Religious Services: Not on file  . Active Member of Clubs or Organizations: Not on file  . Attends Archivist Meetings: Not on file  . Marital Status: Not on file   No family history on file. No Known Allergies Prior to Admission medications   Medication Sig Start Date End Date Taking? Authorizing Provider  aspirin 81 MG EC tablet Take 81 mg by mouth daily.   Yes [provider]  metoprolol tartrate (LOPRESSOR) 25 MG tablet Take 25 mg by mouth daily.  01/30/19  Yes [provider]  omeprazole (PRILOSEC) 40 MG capsule Take 40 mg by mouth daily. 07/03/19  Yes [provider]  solifenacin (VESICARE) 10 MG  tablet Take 10 mg by mouth daily.    Yes [provider]  spironolactone (ALDACTONE) 50 MG tablet Take 50 mg by mouth daily.  01/30/19  Yes [provider]   DG Tibia/Fibula Left  Result Date: 07/11/2019 CLINICAL DATA:  Status post trauma with left leg pain. EXAM: LEFT TIBIA AND FIBULA - 2 VIEW COMPARISON:  None. FINDINGS: Comminuted displaced fractures are identified in the upper to mid shaft of the tibia and fibula. No dislocation is noted. IMPRESSION: Comminuted displaced fractures are identified in the upper to mid shaft of the tibia and fibula. Electronically Signed   By: Abelardo Diesel M.D.   On:  07/11/2019 08:38   CT TIBIA FIBULA LEFT WO CONTRAST  Result Date: 07/11/2019 CLINICAL DATA:  Fractures of the left tibia and fibula. EXAM: CT OF THE LOWER LEFT EXTREMITY WITHOUT CONTRAST TECHNIQUE: Multidetector CT imaging of the lower left extremity was performed according to the standard protocol. COMPARISON:  Radiographs dated 07/11/2019 FINDINGS: Bones/Joint/Cartilage There is a deeply impacted fracture of the medial tibial plateau. Maximum to impaction is approximately 2.6 cm. There is also a comminuted fracture of the proximal and midshaft of the left tibia with slight angulation and displacement. There is a comminuted slightly displaced spiral fracture of proximal fibular shaft. There is a small hemarthrosis in the knee joint. The distal tibia and fibula are intact. Ankle joint appears normal. Distal femur is osteopenic but otherwise normal. Ligaments Suboptimally assessed by CT. The cruciate ligaments and the lateral collateral ligament are intact. Medial collateral ligament appears to be intact. Muscles and Tendons No significant abnormalities. Soft tissues Soft tissue swelling around the knee. IMPRESSION: 1. Comminuted deeply impacted fracture of the medial tibial plateau. 2. Comminuted fracture of the proximal and midshaft of the left tibia with slight angulation and displacement. 3. Comminuted slightly displaced spiral fracture of the proximal fibular shaft. 4. Small hemarthrosis in the knee joint. Electronically Signed   By: Lorriane Shire M.D.   On: 07/11/2019 12:32   Family History Reviewed and non-contributory, no pertinent history of problems with bleeding or anesthesia     Review of Systems 14 system ROS conducted and negative except for that noted in HPI   OBJECTIVE  General: Alert, no acute distress Cardiovascular: Warm extremities noted Respiratory: No cyanosis, no use of accessory musculature GI: No organomegaly, abdomen is soft and non-tender Skin: No lesions in the area of  chief complaint other than those listed below in MSK exam.  Neurologic: Sensation intact distally save for the below mentioned MSK exam Psychiatric: Patient is competent for consent with normal mood and affect Lymphatic: No swelling obvious and reported other than the area involved in the exam below Extremities  Bilateral upper extremities: patient actively moves her bilateral upper extremities without pain, neurovascularly intact  RLE: Nontender to palpation about hip, knee, ankle, actively moves RLE, compartments soft, sensation intact, warm well perfused foot LLE: Splint in place - CDI, no pain with ROM of ankle/foot, tender about midshin/knee, no pain with passive stretch, sensation intact, brisk cap refill  Labs cbc Recent Labs    07/11/19 1141  WBC 15.2*  HGB 10.8*  HCT 33.1*  PLT 272    Labs inflam No results for input(s): CRP in the last 72 hours.  Invalid input(s): ESR  Labs coag No results for input(s): INR, PTT in the last 72 hours.  Invalid input(s): PT  Recent Labs    07/11/19 1141  NA 138  K 4.2  CL 104  CO2 29  GLUCOSE 115*  BUN 13  CREATININE 1.08*  CALCIUM 9.4     ASSESSMENT AND PLAN: 73 y.o. female with the following: left tibial plateau and tibial shaft fracture  This patient requires inpatient admission to manage this problem appropriately.  - Weight Bearing Status/Activity: NWB LLE  - Additional recommended labs/tests: None  -VTE Prophylaxis: SCDs. Will likely begin Lovenox post-operatively  - Pain control: PRN pain medications. Preferring oral medications.   The risks benefits and alternatives were discussed with the patient including but not limited to the risks of nonoperative treatment, versus surgical intervention including infection, bleeding, nerve injury, blood clots, cardiopulmonary complications, morbidity, mortality, among others, and they were willing to proceed.    -Procedures: Closed reduction external fixation of left  tibial plateau and tibial shaft fracture by Dr. [name] on 07/12/2019 - NPO at midnight - Pre-operative antibiotics: [place], PA-C 07/11/2019

## 2019-07-11 NOTE — ED Notes (Signed)
Patient to [facility] Room 5Nbed5  [facility] for transport  [phone]

## 2019-07-11 NOTE — ED Notes (Signed)
Pt being transported to [facility] 5N bed 5 by [facility]. All belongings sent with patient.

## 2019-07-11 NOTE — Progress Notes (Signed)
Called regarding left tibial plateau and tibial shaft fracture.  Requested for transfer from [facility] to [facility] for definitive trauma management.  Discussed with care length, there transport services are fairly delayed today, and she will not likely be able to be transported until late this evening, okay for her to eat, she is already splinted, no signs of compartment syndrome according to the emergency room physicians, plan for n.p.o. after midnight tonight, and surgical intervention tomorrow once she has been optimized.  Full admission to follow once she arrives at [facility].  Marchia Bond, MD

## 2019-07-11 NOTE — ED Provider Notes (Signed)
[facility] Emergency Department Provider Note   ____________________________________________   First MD Initiated Contact with Patient 07/11/19 [phone]     (approximate)  I have reviewed the triage vital signs and the nursing notes.   HISTORY  Chief Complaint Fall    HPI Alexis Nichols is a 73 y.o. female with history of vestibular schwannoma status post resection, CKD, and cirrhosis presents to the ED complaining of fall.  Patient reports that just prior to arrival she accidentally stepped on a dog bed that slipped out from under her, causing her to fall.  She reports falling onto her left lower leg and hearing a pop in the area of her knee, but denies hitting her head or losing consciousness.  She has had significant pain in her left lower leg since the fall, and has been unable to bear any weight on her left leg.  She was transported to the ED by EMS, who noted shortening of her left lower extremity.  She denies any pain in either hip and does not have any pain in her upper extremities, chest or abdomen.        Past Medical History:  Diagnosis Date  . Brain tumor ([place])     There are no problems to display for this patient.   Past Surgical History:  Procedure Laterality Date  . BRAIN TUMOR EXCISION  11/2018    Prior to Admission medications   Medication Sig Start Date End Date Taking? Authorizing Provider  aspirin 81 MG EC tablet Take 81 mg by mouth daily.   Yes [provider]  metoprolol tartrate (LOPRESSOR) 25 MG tablet Take 25 mg by mouth daily.  01/30/19  Yes [provider]  omeprazole (PRILOSEC) 40 MG capsule Take 40 mg by mouth daily. 07/03/19  Yes [provider]  solifenacin (VESICARE) 10 MG tablet Take 10 mg by mouth daily.    Yes [provider]  spironolactone (ALDACTONE) 50 MG tablet Take 50 mg by mouth daily.  01/30/19  Yes [provider]    Allergies Patient has no known  allergies.  History reviewed. No pertinent family history.  Social History Social History   Tobacco Use  . Smoking status: Never Smoker  . Smokeless tobacco: Never Used  Substance Use Topics  . Alcohol use: Never  . Drug use: Not on file    Review of Systems  Constitutional: No fever/chills Eyes: No visual changes. ENT: No sore throat. Cardiovascular: Denies chest pain. Respiratory: Denies shortness of breath. Gastrointestinal: No abdominal pain.  No nausea, no vomiting.  No diarrhea.  No constipation. Genitourinary: Negative for dysuria. Musculoskeletal: Negative for back pain.  Positive for left lower leg pain. Skin: Negative for rash. Neurological: Negative for headaches, focal weakness or numbness.  ____________________________________________   PHYSICAL EXAM:  VITAL SIGNS: ED Triage Vitals  Enc Vitals Group     BP --      Pulse Rate 07/11/19 0752 (!) 133     Resp 07/11/19 0752 18     Temp --      Temp Source 07/11/19 0752 Oral     SpO2 07/11/19 0752 99 %     Weight 07/11/19 0753 117 lb (53.1 kg)     Height 07/11/19 0753 5\' 2"  (1.575 m)     Head Circumference --      Peak Flow --      Pain Score 07/11/19 0752 9     Pain Loc --  Pain Edu? --      Excl. in [place]? --     Constitutional: Alert and oriented. Eyes: Conjunctivae are normal. Head: Atraumatic. Nose: No congestion/rhinnorhea. Mouth/Throat: Mucous membranes are moist. Neck: Normal ROM Cardiovascular: Normal rate, regular rhythm. Grossly normal heart sounds.  2+ DP pulses bilaterally. Respiratory: Normal respiratory effort.  No retractions. Lungs CTAB. Gastrointestinal: Soft and nontender. No distention. Genitourinary: deferred Musculoskeletal: Obvious deformity to left mid shin with associated edema and tenderness.  No tenderness noted to ankle or foot with range of motion at ankle and foot intact.  Minimal diffuse tenderness to left knee with no obvious deformity.  No tenderness at bilateral  hips with pelvis stable. Neurologic:  Normal speech and language. No gross focal neurologic deficits are appreciated. Skin:  Skin is warm, dry and intact. No rash noted. Psychiatric: Mood and affect are normal. Speech and behavior are normal.  ____________________________________________   LABS (all labs ordered are listed, but only abnormal results are displayed)  Labs Reviewed  BASIC METABOLIC PANEL - Abnormal; Notable for the following components:      Result Value   Glucose, Bld 115 (*)    Creatinine, Ser 1.08 (*)    GFR calc non Af Amer 51 (*)    GFR calc Af Amer 59 (*)    All other components within normal limits  CBC WITH DIFFERENTIAL/PLATELET - Abnormal; Notable for the following components:   WBC 15.2 (*)    Hemoglobin 10.8 (*)    HCT 33.1 (*)    Neutro Abs 12.6 (*)    All other components within normal limits  SARS CORONAVIRUS 2 (TAT 6-24 HRS)  URINALYSIS, COMPLETE (UACMP) WITH MICROSCOPIC     PROCEDURES  Procedure(s) performed (including Critical Care):  Procedures   ____________________________________________   INITIAL IMPRESSION / ASSESSMENT AND PLAN / ED COURSE       73 year old female presents to the ED following mechanical fall where she slipped on a dog bed, falling onto her left leg.  She now has obvious deformity to her left lower leg at the mid shin, neurovascularly intact distally on exam.  We will check x-rays of her knee and tib/fib, treat pain.  No evidence of hip fracture on either side and she does not have any evidence of head trauma.  X-ray shows comminuted fracture of left tib-fib extending proximally towards her knee.  Case was discussed with Dr. Donivan Scull of orthopedics, who recommends placement of posterior long-leg splint and outpatient follow-up with Dr. Marry Guan.  Patient does have some ongoing pain following dose of Percocet, will give dose of IM morphine, but she is agreeable to plan of outpatient management.  She has family currently in  town for the holidays to assist her and has a walker as well as wheelchair available from her late husband.  Dr. Donivan Scull called back to say that CT scan of her left lower extremity would be advised to ensure there is no significant involvement of tibial plateau.  CT scan was then obtained and showed comminuted fracture of proximal tibia with involvement of tibial plateau and depressed fracture.  Dr. Donivan Scull recommends transfer to [facility] for further evaluation and treatment by orthopedic trauma.  Case discussed with Dr. [name] of orthopedics at [facility], who accepts patient in transfer.  Currently no signs or symptoms of compartment syndrome as patient's pain is well controlled and she has range of motion of left toes with no discomfort.  She remains neurovascularly intact to her left lower extremity.  ____________________________________________   FINAL CLINICAL IMPRESSION(S) / ED DIAGNOSES  Final diagnoses:  Closed fracture of proximal end of left tibia, unspecified fracture morphology, initial encounter  Fall, initial encounter  Closed fracture of proximal end of left fibula, unspecified fracture morphology, initial encounter     ED Discharge Orders    None       Note:  This document was prepared using Dragon voice recognition software and may include unintentional dictation errors.   Blake Divine, MD 07/11/19 [phone]

## 2019-07-11 NOTE — ED Triage Notes (Signed)
Pt presents to ED via [facility] from home c/o L knee/lower leg pain after mechanical fall today. Pt states she tripped while walking with her walker. Swelling and redness noted to mid-shin. Ice pack applied.

## 2019-07-11 NOTE — Progress Notes (Signed)
Pt arrived to the unit. All belongings with pt, pt not in distress and tolerated well. Vitals taken.

## 2019-07-12 ENCOUNTER — Encounter (HOSPITAL_COMMUNITY)
Admission: AD | Disposition: A | Discharge status: Skilled Nursing Facility | Payer: Self-pay | Source: Other Acute Inpatient Hospital | Attending: Orthopedic Surgery

## 2019-07-12 ENCOUNTER — Inpatient Hospital Stay (HOSPITAL_COMMUNITY): Payer: Medicare HMO

## 2019-07-12 ENCOUNTER — Inpatient Hospital Stay (HOSPITAL_COMMUNITY): Payer: Medicare HMO | Admitting: Certified Registered Nurse Anesthetist

## 2019-07-12 HISTORY — PX: EXTERNAL FIXATION LEG: SHX1549

## 2019-07-12 LAB — CBC
HCT: 30.1 % — ABNORMAL LOW (ref 36.0–46.0)
Hemoglobin: 9.7 g/dL — ABNORMAL LOW (ref 12.0–15.0)
MCH: 27.6 pg (ref 26.0–34.0)
MCHC: 32.2 g/dL (ref 30.0–36.0)
MCV: 85.8 fL (ref 80.0–100.0)
Platelets: 207 10*3/uL (ref 150–400)
RBC: 3.51 MIL/uL — ABNORMAL LOW (ref 3.87–5.11)
RDW: 14.9 % (ref 11.5–15.5)
WBC: 11.3 10*3/uL — ABNORMAL HIGH (ref 4.0–10.5)
nRBC: 0 % (ref 0.0–0.2)

## 2019-07-12 LAB — SURGICAL PCR SCREEN
MRSA, PCR: NEGATIVE
Staphylococcus aureus: NEGATIVE

## 2019-07-12 LAB — CREATININE, SERUM
Creatinine, Ser: 1.27 mg/dL — ABNORMAL HIGH (ref 0.44–1.00)
GFR calc Af Amer: 48 mL/min — ABNORMAL LOW (ref >60)
GFR calc non Af Amer: 42 mL/min — ABNORMAL LOW (ref >60)

## 2019-07-12 SURGERY — EXTERNAL FIXATION, LOWER EXTREMITY
Anesthesia: General | Laterality: Left

## 2019-07-12 MED ORDER — POTASSIUM CHLORIDE IN NACL 20-0.45 MEQ/L-% IV SOLN
INTRAVENOUS | Status: DC
  Filled 2019-07-12 (×2): qty 1000

## 2019-07-12 MED ORDER — LIDOCAINE 2% (20 MG/ML) 5 ML SYRINGE
INTRAMUSCULAR | Status: DC | PRN
  Administered 2019-07-12: 40 mg via INTRAVENOUS

## 2019-07-12 MED ORDER — MENTHOL 3 MG MT LOZG: 1.0000 | LOZENGE | OROMUCOSAL | Status: DC | PRN

## 2019-07-12 MED ORDER — SUGAMMADEX SODIUM 200 MG/2ML IV SOLN
INTRAVENOUS | Status: DC | PRN
  Administered 2019-07-12: 220 mg via INTRAVENOUS

## 2019-07-12 MED ORDER — HYDROCODONE-ACETAMINOPHEN 5-325 MG PO TABS
1.0000 | ORAL_TABLET | ORAL | Status: DC | PRN
  Administered 2019-07-15 (×2): 1 via ORAL
  Administered 2019-07-17 – 2019-07-19 (×4): 2 via ORAL
  Administered 2019-07-22: 1 via ORAL
  Filled 2019-07-12: qty 1
  Filled 2019-07-12 (×3): qty 2
  Filled 2019-07-12 (×2): qty 1
  Filled 2019-07-12: qty 2
  Filled 2019-07-12: qty 1

## 2019-07-12 MED ORDER — CHLORHEXIDINE GLUCONATE 4 % EX LIQD
60.0000 mL | Freq: Once | CUTANEOUS | Status: AC
  Administered 2019-07-12: 4 via TOPICAL
  Filled 2019-07-12: qty 60

## 2019-07-12 MED ORDER — ENOXAPARIN SODIUM 30 MG/0.3ML ~~LOC~~ SOLN
30.0000 mg | SUBCUTANEOUS | Status: DC
  Administered 2019-07-13 – 2019-07-18 (×5): 30 mg via SUBCUTANEOUS
  Filled 2019-07-12 (×5): qty 0.3

## 2019-07-12 MED ORDER — POLYETHYLENE GLYCOL 3350 17 G PO PACK: 17.0000 g | PACK | Freq: Every day | ORAL | Status: DC | PRN

## 2019-07-12 MED ORDER — 0.9 % SODIUM CHLORIDE (POUR BTL) OPTIME
TOPICAL | Status: DC | PRN
  Administered 2019-07-12: 1000 mL

## 2019-07-12 MED ORDER — DEXAMETHASONE SODIUM PHOSPHATE 10 MG/ML IJ SOLN
INTRAMUSCULAR | Status: AC
  Filled 2019-07-12: qty 1

## 2019-07-12 MED ORDER — MORPHINE SULFATE (PF) 2 MG/ML IV SOLN: 0.5000 mg | INTRAVENOUS | Status: DC | PRN

## 2019-07-12 MED ORDER — ALUM & MAG HYDROXIDE-SIMETH 200-200-20 MG/5ML PO SUSP
30.0000 mL | ORAL | Status: DC | PRN
  Administered 2019-07-13 – 2019-07-16 (×10): 30 mL via ORAL
  Filled 2019-07-12 (×10): qty 30

## 2019-07-12 MED ORDER — EPHEDRINE SULFATE-NACL 50-0.9 MG/10ML-% IV SOSY
PREFILLED_SYRINGE | INTRAVENOUS | Status: DC | PRN
  Administered 2019-07-12: 15 mg via INTRAVENOUS

## 2019-07-12 MED ORDER — PROPOFOL 10 MG/ML IV BOLUS
INTRAVENOUS | Status: AC
  Filled 2019-07-12: qty 20

## 2019-07-12 MED ORDER — ROCURONIUM BROMIDE 50 MG/5ML IV SOSY
PREFILLED_SYRINGE | INTRAVENOUS | Status: DC | PRN
  Administered 2019-07-12: 50 mg via INTRAVENOUS

## 2019-07-12 MED ORDER — CEFAZOLIN SODIUM-DEXTROSE 2-4 GM/100ML-% IV SOLN
2.0000 g | Freq: Four times a day (QID) | INTRAVENOUS | Status: AC
  Administered 2019-07-12 (×2): 2 g via INTRAVENOUS
  Filled 2019-07-12 (×2): qty 100

## 2019-07-12 MED ORDER — MAGNESIUM CITRATE PO SOLN: 1.0000 | Freq: Once | ORAL | Status: DC | PRN

## 2019-07-12 MED ORDER — ONDANSETRON HCL 4 MG/2ML IJ SOLN: 4.0000 mg | Freq: Four times a day (QID) | INTRAMUSCULAR | Status: DC | PRN

## 2019-07-12 MED ORDER — ACETAMINOPHEN 500 MG PO TABS
500.0000 mg | ORAL_TABLET | Freq: Four times a day (QID) | ORAL | Status: AC
  Administered 2019-07-12 – 2019-07-13 (×3): 500 mg via ORAL
  Filled 2019-07-12 (×3): qty 1

## 2019-07-12 MED ORDER — EPHEDRINE 5 MG/ML INJ
INTRAVENOUS | Status: AC
  Filled 2019-07-12: qty 10

## 2019-07-12 MED ORDER — SUCCINYLCHOLINE CHLORIDE 200 MG/10ML IV SOSY
PREFILLED_SYRINGE | INTRAVENOUS | Status: AC
  Filled 2019-07-12: qty 10

## 2019-07-12 MED ORDER — CEFAZOLIN SODIUM-DEXTROSE 2-4 GM/100ML-% IV SOLN
2.0000 g | INTRAVENOUS | Status: AC
  Administered 2019-07-12: 2 g via INTRAVENOUS
  Filled 2019-07-12: qty 100

## 2019-07-12 MED ORDER — PHENOL 1.4 % MT LIQD: 1.0000 | OROMUCOSAL | Status: DC | PRN

## 2019-07-12 MED ORDER — PHENYLEPHRINE HCL-NACL 10-0.9 MG/250ML-% IV SOLN
INTRAVENOUS | Status: DC | PRN
  Administered 2019-07-12: 30 ug/min via INTRAVENOUS

## 2019-07-12 MED ORDER — PROPOFOL 10 MG/ML IV BOLUS
INTRAVENOUS | Status: DC | PRN
  Administered 2019-07-12: 100 mg via INTRAVENOUS

## 2019-07-12 MED ORDER — OXYCODONE HCL 5 MG PO TABS
5.0000 mg | ORAL_TABLET | Freq: Once | ORAL | Status: AC | PRN
  Administered 2019-07-12: 13:00:00 5 mg via ORAL

## 2019-07-12 MED ORDER — ENSURE PRE-SURGERY PO LIQD
296.0000 mL | Freq: Once | ORAL | Status: AC
  Administered 2019-07-12: 296 mL via ORAL
  Filled 2019-07-12: qty 296

## 2019-07-12 MED ORDER — OXYCODONE HCL 5 MG/5ML PO SOLN: 5.0000 mg | Freq: Once | ORAL | Status: AC | PRN

## 2019-07-12 MED ORDER — ONDANSETRON HCL 4 MG/2ML IJ SOLN
INTRAMUSCULAR | Status: AC
  Filled 2019-07-12: qty 2

## 2019-07-12 MED ORDER — FERROUS SULFATE 325 (65 FE) MG PO TABS
325.0000 mg | ORAL_TABLET | Freq: Three times a day (TID) | ORAL | Status: DC
  Administered 2019-07-12 – 2019-07-24 (×35): 325 mg via ORAL
  Filled 2019-07-12 (×34): qty 1

## 2019-07-12 MED ORDER — MEPERIDINE HCL 25 MG/ML IJ SOLN: 6.2500 mg | INTRAMUSCULAR | Status: DC | PRN

## 2019-07-12 MED ORDER — HYDROCODONE-ACETAMINOPHEN 7.5-325 MG PO TABS
1.0000 | ORAL_TABLET | ORAL | Status: DC | PRN
  Administered 2019-07-13 – 2019-07-16 (×4): 1 via ORAL
  Administered 2019-07-17: 2 via ORAL
  Administered 2019-07-18 – 2019-07-21 (×2): 1 via ORAL
  Filled 2019-07-12 (×3): qty 1
  Filled 2019-07-12: qty 2
  Filled 2019-07-12 (×6): qty 1

## 2019-07-12 MED ORDER — FENTANYL CITRATE (PF) 100 MCG/2ML IJ SOLN
INTRAMUSCULAR | Status: DC | PRN
  Administered 2019-07-12 (×3): 50 ug via INTRAVENOUS

## 2019-07-12 MED ORDER — HYDROMORPHONE HCL 1 MG/ML IJ SOLN: 0.2500 mg | INTRAMUSCULAR | Status: DC | PRN

## 2019-07-12 MED ORDER — LIDOCAINE 2% (20 MG/ML) 5 ML SYRINGE
INTRAMUSCULAR | Status: AC
  Filled 2019-07-12: qty 5

## 2019-07-12 MED ORDER — SENNA 8.6 MG PO TABS
1.0000 | ORAL_TABLET | Freq: Two times a day (BID) | ORAL | Status: DC
  Administered 2019-07-12 – 2019-07-24 (×22): 8.6 mg via ORAL
  Filled 2019-07-12 (×23): qty 1

## 2019-07-12 MED ORDER — BISACODYL 10 MG RE SUPP: 10.0000 mg | Freq: Every day | RECTAL | Status: DC | PRN

## 2019-07-12 MED ORDER — PROMETHAZINE HCL 25 MG/ML IJ SOLN: 6.2500 mg | INTRAMUSCULAR | Status: DC | PRN

## 2019-07-12 MED ORDER — ONDANSETRON HCL 4 MG/2ML IJ SOLN
INTRAMUSCULAR | Status: DC | PRN
  Administered 2019-07-12: 4 mg via INTRAVENOUS

## 2019-07-12 MED ORDER — DEXAMETHASONE SODIUM PHOSPHATE 10 MG/ML IJ SOLN
INTRAMUSCULAR | Status: DC | PRN
  Administered 2019-07-12: 10 mg via INTRAVENOUS

## 2019-07-12 MED ORDER — ROCURONIUM BROMIDE 10 MG/ML (PF) SYRINGE
PREFILLED_SYRINGE | INTRAVENOUS | Status: AC
  Filled 2019-07-12: qty 10

## 2019-07-12 MED ORDER — OXYCODONE HCL 5 MG PO TABS
ORAL_TABLET | ORAL | Status: AC
  Filled 2019-07-12: qty 1

## 2019-07-12 MED ORDER — DOCUSATE SODIUM 100 MG PO CAPS
100.0000 mg | ORAL_CAPSULE | Freq: Two times a day (BID) | ORAL | Status: DC
  Administered 2019-07-12 – 2019-07-24 (×22): 100 mg via ORAL
  Filled 2019-07-12 (×23): qty 1

## 2019-07-12 MED ORDER — POVIDONE-IODINE 10 % EX SWAB: 2.0000 "application " | Freq: Once | CUTANEOUS | Status: DC

## 2019-07-12 MED ORDER — KETOROLAC TROMETHAMINE 15 MG/ML IJ SOLN
7.5000 mg | Freq: Four times a day (QID) | INTRAMUSCULAR | Status: AC
  Administered 2019-07-12 – 2019-07-13 (×4): 7.5 mg via INTRAVENOUS
  Filled 2019-07-12 (×4): qty 1

## 2019-07-12 MED ORDER — ACETAMINOPHEN 325 MG PO TABS: 325.0000 mg | ORAL_TABLET | Freq: Four times a day (QID) | ORAL | Status: DC | PRN

## 2019-07-12 MED ORDER — ONDANSETRON HCL 4 MG PO TABS: 4.0000 mg | ORAL_TABLET | Freq: Four times a day (QID) | ORAL | Status: DC | PRN

## 2019-07-12 MED ORDER — MIDAZOLAM HCL 2 MG/2ML IJ SOLN: 0.5000 mg | Freq: Once | INTRAMUSCULAR | Status: DC | PRN

## 2019-07-12 MED ORDER — FENTANYL CITRATE (PF) 250 MCG/5ML IJ SOLN
INTRAMUSCULAR | Status: AC
  Filled 2019-07-12: qty 5

## 2019-07-12 SURGICAL SUPPLY — 28 items
BAR GLASS FIBER EXFX 11X500 (EXFIX) ×6 IMPLANT
BNDG COHESIVE 6X5 TAN STRL LF (GAUZE/BANDAGES/DRESSINGS) ×3 IMPLANT
BNDG ELASTIC 4X5.8 VLCR STR LF (GAUZE/BANDAGES/DRESSINGS) ×3 IMPLANT
BNDG ELASTIC 6X15 VLCR STRL LF (GAUZE/BANDAGES/DRESSINGS) ×3 IMPLANT
BNDG ELASTIC 6X5.8 VLCR STR LF (GAUZE/BANDAGES/DRESSINGS) ×3 IMPLANT
COVER SURGICAL LIGHT HANDLE (MISCELLANEOUS) ×3 IMPLANT
DRAPE C-ARM 42X72 X-RAY (DRAPES) ×3 IMPLANT
DRAPE U-SHAPE 47X51 STRL (DRAPES) ×3 IMPLANT
DRSG ADAPTIC 3X8 NADH LF (GAUZE/BANDAGES/DRESSINGS) ×3 IMPLANT
GAUZE SPONGE 4X4 12PLY STRL (GAUZE/BANDAGES/DRESSINGS) ×3 IMPLANT
GAUZE XEROFORM 5X9 LF (GAUZE/BANDAGES/DRESSINGS) ×3 IMPLANT
GLOVE ORTHO TXT STRL SZ7.5 (GLOVE) ×6 IMPLANT
GOWN STRL REUS W/ TWL XL LVL3 (GOWN DISPOSABLE) ×2 IMPLANT
GOWN STRL REUS W/TWL XL LVL3 (GOWN DISPOSABLE) ×4
HALF PIN 5.0X160 (EXFIX) ×3 IMPLANT
KIT BASIN OR (CUSTOM PROCEDURE TRAY) ×3 IMPLANT
KIT TURNOVER KIT B (KITS) ×3 IMPLANT
NS IRRIG 1000ML POUR BTL (IV SOLUTION) ×3 IMPLANT
PACK ORTHO EXTREMITY (CUSTOM PROCEDURE TRAY) ×3 IMPLANT
PAD ARMBOARD 7.5X6 YLW CONV (MISCELLANEOUS) ×6 IMPLANT
PIN CLAMP 2BAR 75MM BLUE (EXFIX) ×6 IMPLANT
PIN HALF YELLOW 5X160X35 (EXFIX) ×9 IMPLANT
STOCKINETTE IMPERVIOUS LG (DRAPES) ×3 IMPLANT
TOWEL GREEN STERILE (TOWEL DISPOSABLE) ×6 IMPLANT
TUBE CONNECTING 12'X1/4 (SUCTIONS) ×1
TUBE CONNECTING 12X1/4 (SUCTIONS) ×2 IMPLANT
UNDERPAD 30X30 (UNDERPADS AND DIAPERS) ×3 IMPLANT
YANKAUER SUCT BULB TIP NO VENT (SUCTIONS) ×3 IMPLANT

## 2019-07-12 NOTE — Op Note (Signed)
07/12/2019  12:16 PM  PATIENT:  Alexis Nichols    PRE-OPERATIVE DIAGNOSIS: Left tibial plateau fracture and tibial shaft fracture  POST-OPERATIVE DIAGNOSIS:  Same  PROCEDURE: Application of multiplanar external fixator to the left femur and tibia  SURGEON:  Johnny Bridge, MD  PHYSICIAN ASSISTANT: Merlene Pulling, PA-C, present and scrubbed throughout the case, critical for completion in a timely fashion, and for retraction, instrumentation, and closure.  ANESTHESIA:   General  PREOPERATIVE INDICATIONS:  [name] is a  73 y.o. female who had a ground-level fall after tripping over a dog bed at home yesterday, transferred from [facility] regional for management of complex tibia fracture.  The risks benefits and alternatives were discussed with the patient preoperatively including but not limited to the risks of infection, bleeding, nerve injury, cardiopulmonary complications, the need for revision surgery, among others, and the patient was willing to proceed.  We also discussed the risks for compartment syndrome, the requirement for definitive internal fixation and likelihood of additional surgeries, among others.  ESTIMATED BLOOD LOSS: 50 mL  OPERATIVE IMPLANTS: Zimmer external fixator with 5 mm pins in the femur and the tibia, a total of 2 in each, with a carbon fiber bar on either side.  OPERATIVE FINDINGS: Displaced medial tibial plateau fracture with extension down into the shaft  OPERATIVE PROCEDURE: The patient was brought to the operating room and placed in the supine position.  General anesthesia was administered.  The left lower extremity was prepped and draped in usual sterile fashion.  Timeout performed.  Small incision was made proximally anteriorly and a Schanz pin was placed into the femur, and then a second pin was placed.  I placed 2 pins in the tibia.  C-arm was used to confirm position on AP and lateral views and I just barely engaged the far cortex, in order to get  appropriate stability.  Bone quality was mediocre.  I then assembled the external fixator apparatus, secured everywhere with the exception of the distal pins, and then placed traction and manipulation on the fracture site in order to improve soft tissue tension and overall alignment.  All of the external fixator adjustments were secured, then dressed with sterile gauze with Xeroform and 4 x 4's as well as a light compressive Ace wrap.  Her compartments were soft although swollen.  She was awakened and returned to the PACU in stable and satisfactory condition.  There were no complications and she tolerated the procedure well.  She will plan for definitive internal fixation once her soft tissue swelling has been optimized.  Johnny Bridge, MD

## 2019-07-12 NOTE — Anesthesia Preprocedure Evaluation (Addendum)
Anesthesia Evaluation  Patient identified by MRN, date of birth, ID band Patient awake    Reviewed: Allergy & Precautions, NPO status , Patient's Chart, lab work & pertinent test results, reviewed documented beta blocker date and time   History of Anesthesia Complications Negative for: history of anesthetic complications  Airway Mallampati: II  TM Distance: >3 FB Neck ROM: Full    Dental  (+) Edentulous Upper, Edentulous Lower   Pulmonary neg pulmonary ROS,  07/11/2019 SARS coronavirus NEG   breath sounds clear to auscultation       Cardiovascular hypertension, Pt. on medications and Pt. on home beta blockers (-) angina Rhythm:Regular Rate:Normal  paroxysmal SVT in 2016. Seen by cardiology. Echo unremarkable   Neuro/Psych H/o brain tumor: Schwannoma    GI/Hepatic GERD  Medicated and Controlled,(+) Cirrhosis  (no alcohol in 10 years)      ,   Endo/Other  negative endocrine ROS  Renal/GU negative Renal ROS     Musculoskeletal   Abdominal   Peds  Hematology negative hematology ROS (+)   Anesthesia Other Findings   Reproductive/Obstetrics                           Anesthesia Physical Anesthesia Plan  ASA: III  Anesthesia Plan: General   Post-op Pain Management:    Induction: Intravenous  PONV Risk Score and Plan: 3 and Ondansetron, Dexamethasone and Treatment may vary due to age or medical condition  Airway Management Planned: Oral ETT  Additional Equipment: None  Intra-op Plan:   Post-operative Plan: Extubation in OR  Informed Consent: I have reviewed the patients History and Physical, chart, labs and discussed the procedure including the risks, benefits and alternatives for the proposed anesthesia with the patient or authorized representative who has indicated his/her understanding and acceptance.       Plan Discussed with: CRNA and Surgeon  Anesthesia Plan Comments:        Anesthesia Quick Evaluation

## 2019-07-12 NOTE — Anesthesia Procedure Notes (Signed)
Procedure Name: Intubation Date/Time: 07/12/2019 11:20 AM Performed by: Lance Coon, CRNA Pre-anesthesia Checklist: Patient identified, Emergency Drugs available, Suction available, Patient being monitored and Timeout performed Patient Re-evaluated:Patient Re-evaluated prior to induction Oxygen Delivery Method: Circle system utilized Preoxygenation: Pre-oxygenation with 100% oxygen Induction Type: IV induction Ventilation: Mask ventilation without difficulty Laryngoscope Size: Miller and 2 Grade View: Grade I Tube type: Oral Tube size: 7.0 mm Number of attempts: 1 Airway Equipment and Method: Stylet Placement Confirmation: ETT inserted through vocal cords under direct vision,  positive ETCO2 and breath sounds checked- equal and bilateral Secured at: 21 cm Tube secured with: Tape Dental Injury: Teeth and Oropharynx as per pre-operative assessment

## 2019-07-12 NOTE — Progress Notes (Signed)
Patient reexamined, neurovascularly intact, EHL intact, sensation intact throughout the toes, compartments are soft.  The risks benefits and alternatives were discussed with the patient including but not limited to the risks of nonoperative treatment, versus surgical intervention including infection, bleeding, nerve injury, malunion, nonunion, the need for revision surgery, hardware prominence, hardware failure, the need for hardware removal, blood clots, cardiopulmonary complications, morbidity, mortality, among others, and they were willing to proceed.    We will plan for staged surgical intervention with external fixation today.

## 2019-07-12 NOTE — Transfer of Care (Signed)
Immediate Anesthesia Transfer of Care Note  Patient: CHINELLE ORDONES  Procedure(s) Performed: EXTERNAL FIXATION LEG (Left )  Patient Location: PACU  Anesthesia Type:General  Level of Consciousness: awake and patient cooperative  Airway & Oxygen Therapy: Patient Spontanous Breathing  Post-op Assessment: Report given to RN and Post -op Vital signs reviewed and stable  Post vital signs: Reviewed and stable  Last Vitals:  Vitals Value Taken Time  BP 100/56 07/12/19 1225  Temp    Pulse 81 07/12/19 1226  Resp 17 07/12/19 1226  SpO2 97 % 07/12/19 1226  Vitals shown include unvalidated device data.  Last Pain:  Vitals:   07/12/19 0759  TempSrc: Oral  PainSc:       Patients Stated Pain Goal: 2 ([id number])  Complications: No apparent anesthesia complications

## 2019-07-12 NOTE — Plan of Care (Signed)
  Problem: Education: Goal: Knowledge of General Education information will improve Description: Including pain rating scale, medication(s)/side effects and non-pharmacologic comfort measures Outcome: Progressing   Problem: Safety: Goal: Ability to remain free from injury will improve Outcome: Progressing   

## 2019-07-12 NOTE — Anesthesia Postprocedure Evaluation (Addendum)
Anesthesia Post Note  Patient: Alexis Nichols  Procedure(s) Performed: EXTERNAL FIXATION LEG (Left )     Patient location during evaluation: PACU Anesthesia Type: General Level of consciousness: awake and alert, patient cooperative and oriented Pain management: pain level controlled Vital Signs Assessment: post-procedure vital signs reviewed and stable Respiratory status: spontaneous breathing, nonlabored ventilation, respiratory function stable and patient connected to nasal cannula oxygen Cardiovascular status: blood pressure returned to baseline and stable Postop Assessment: no apparent nausea or vomiting Anesthetic complications: no    Last Vitals:  Vitals:   07/12/19 1238 07/12/19 1255  BP: (!) 112/52 (!) 110/44  Pulse: 76 (!) 106  Resp: 20 18  Temp: 36.8 C 36.8 C  SpO2: 95% 98%    Last Pain:  Vitals:   07/12/19 1255  TempSrc: Oral  PainSc:                  Arie Powell,E. Seth Friedlander

## 2019-07-13 ENCOUNTER — Encounter (HOSPITAL_COMMUNITY): Payer: Self-pay | Admitting: Orthopedic Surgery

## 2019-07-13 ENCOUNTER — Inpatient Hospital Stay (HOSPITAL_COMMUNITY): Payer: Medicare HMO

## 2019-07-13 LAB — CBC
HCT: 27.7 % — ABNORMAL LOW (ref 36.0–46.0)
Hemoglobin: 8.7 g/dL — ABNORMAL LOW (ref 12.0–15.0)
MCH: 27.2 pg (ref 26.0–34.0)
MCHC: 31.4 g/dL (ref 30.0–36.0)
MCV: 86.6 fL (ref 80.0–100.0)
Platelets: 231 10*3/uL (ref 150–400)
RBC: 3.2 MIL/uL — ABNORMAL LOW (ref 3.87–5.11)
RDW: 15 % (ref 11.5–15.5)
WBC: 12.7 10*3/uL — ABNORMAL HIGH (ref 4.0–10.5)
nRBC: 0 % (ref 0.0–0.2)

## 2019-07-13 LAB — BASIC METABOLIC PANEL
Anion gap: 9 (ref 5–15)
BUN: 16 mg/dL (ref 8–23)
CO2: 28 mmol/L (ref 22–32)
Calcium: 9.1 mg/dL (ref 8.9–10.3)
Chloride: 98 mmol/L (ref 98–111)
Creatinine, Ser: 1.43 mg/dL — ABNORMAL HIGH (ref 0.44–1.00)
GFR calc Af Amer: 42 mL/min — ABNORMAL LOW (ref >60)
GFR calc non Af Amer: 36 mL/min — ABNORMAL LOW (ref >60)
Glucose, Bld: 168 mg/dL — ABNORMAL HIGH (ref 70–99)
Potassium: 4.1 mmol/L (ref 3.5–5.1)
Sodium: 135 mmol/L (ref 135–145)

## 2019-07-13 LAB — APTT: aPTT: 31 seconds (ref 24–36)

## 2019-07-13 LAB — HEMOGLOBIN AND HEMATOCRIT, BLOOD
HCT: 24 % — ABNORMAL LOW (ref 36.0–46.0)
Hemoglobin: 7.7 g/dL — ABNORMAL LOW (ref 12.0–15.0)

## 2019-07-13 LAB — PREPARE RBC (CROSSMATCH)

## 2019-07-13 MED ORDER — FUROSEMIDE 10 MG/ML IJ SOLN
20.0000 mg | Freq: Once | INTRAMUSCULAR | Status: AC
  Administered 2019-07-14: 20:00:00 20 mg via INTRAVENOUS
  Filled 2019-07-13: qty 2

## 2019-07-13 MED ORDER — ACETAMINOPHEN 325 MG PO TABS
650.0000 mg | ORAL_TABLET | Freq: Once | ORAL | Status: AC
  Administered 2019-07-14: 09:00:00 650 mg via ORAL
  Filled 2019-07-13: qty 2

## 2019-07-13 MED ORDER — LACTATED RINGERS IV SOLN: INTRAVENOUS | Status: DC

## 2019-07-13 MED ORDER — SODIUM CHLORIDE 0.9% IV SOLUTION: Freq: Once | INTRAVENOUS | Status: AC

## 2019-07-13 NOTE — Plan of Care (Signed)
Pt has had heartburn all day treated twice with maalox & is getting scheduled protonix   Problem: Education: Goal: Knowledge of General Education information will improve Description: Including pain rating scale, medication(s)/side effects and non-pharmacologic comfort measures Outcome: Progressing   Problem: Clinical Measurements: Goal: Respiratory complications will improve Outcome: Progressing Note: On room air   Problem: Nutrition: Goal: Adequate nutrition will be maintained Outcome: Progressing   Problem: Coping: Goal: Level of anxiety will decrease Outcome: Progressing   Problem: Elimination: Goal: Will not experience complications related to urinary retention Outcome: Progressing   Problem: Pain Managment: Goal: General experience of comfort will improve Outcome: Progressing Note: Treat left leg/foot pain once with norco which gave relief   Problem: Safety: Goal: Ability to remain free from injury will improve Outcome: Progressing   Problem: Skin Integrity: Goal: Risk for impaired skin integrity will decrease Outcome: Progressing   Problem: Physical Regulation: Goal: Postoperative complications will be avoided or minimized Outcome: Progressing   Problem: Pain Management: Goal: Pain level will decrease with appropriate interventions Outcome: Progressing

## 2019-07-13 NOTE — Progress Notes (Signed)
     Subjective:  73 year old female with history of vestibular schwannoma s/p resection, CKD, cirrhosis presented to the [facility] ED complaining of left leg pain after a fall. She fell by stepping on a dog bed that slipped out from under her. X-ray showed a left tibial plateau and tibial shaft fracture and she was transferred to [facility] for trauma management.   She was taken to the OR on [id number] for an application of a multiplanar external fixator to the left femur and tibia.   Today she is alert and seated up in bed. She complains of moderate pain after x-rays performed earlier today however her pain has decreased significantly since her bedding was readjusted by nursing staff. Denies numbness, tingling, fever, chills.    Objective:   VITALS:   Vitals:   07/13/19 0419 07/13/19 0728 07/13/19 0734 07/13/19 1054  BP: 115/61  (!) 105/47 (!) 105/46  Pulse: 96  100 94  Resp:   16 18  Temp: 98.1 F (36.7 C)  98.2 F (36.8 C)   TempSrc: Oral  Oral   SpO2: 92%  96% 96%  Weight:  53.1 kg    Height:  5\' 2"  (1.575 m)     General: Alert and oriented, in no acute distress Resp: no use of accessory musculature GI: abdomen is soft and non-tender Psychiatric: normal mood and affect Left lower extremity: Compartments soft, full active range of motion of all toes of right foot without pain. EHL intact. Sensation intact distally. 2+ DP pulse. Ex-fix in proper position.     Lab Results  Component Value Date   WBC 12.7 (H) 07/13/2019   HGB 8.7 (L) 07/13/2019   HCT 27.7 (L) 07/13/2019   MCV 86.6 07/13/2019   PLT 231 07/13/2019   BMET    Component Value Date/Time   NA 135 07/13/2019 0446   NA 136 12/25/2011 0531   K 4.1 07/13/2019 0446   K 4.1 12/25/2011 0531   CL 98 07/13/2019 0446   CL 108 (H) 12/25/2011 0531   CO2 28 07/13/2019 0446   CO2 21 12/25/2011 0531   GLUCOSE 168 (H) 07/13/2019 0446   GLUCOSE 78 12/25/2011 0531   BUN 16 07/13/2019 0446   BUN 13 12/25/2011 0531   CREATININE  1.43 (H) 07/13/2019 0446   CREATININE 1.27 12/25/2011 0531   CALCIUM 9.1 07/13/2019 0446   CALCIUM 8.5 12/25/2011 0531   GFRNONAA 36 (L) 07/13/2019 0446   GFRNONAA 44 (L) 12/25/2011 0531   GFRAA 42 (L) 07/13/2019 0446   GFRAA 51 (L) 12/25/2011 0531     Assessment/Plan: 1 Day Post-Op   Active Problems:   Tibial plateau fracture, left   PLAN: All questions and concerns answered regarding Ex-Fix and future ORIF. Spoke with daughters Alexis Nichols and Alexis Nichols over the phone regarding future surgery and next steps.  Plan for left tibial plateau and left tibial shaft fracture ORIF on Monday with Dr. Marcelino Scot. NPO after midnight    Alexis Nichols 07/13/2019, 1:07 PM

## 2019-07-14 ENCOUNTER — Encounter (HOSPITAL_COMMUNITY): Payer: Self-pay | Admitting: Orthopedic Surgery

## 2019-07-14 ENCOUNTER — Encounter (HOSPITAL_COMMUNITY)
Admission: AD | Disposition: A | Discharge status: Skilled Nursing Facility | Payer: Self-pay | Source: Other Acute Inpatient Hospital | Attending: Orthopedic Surgery

## 2019-07-14 DIAGNOSIS — S82202A Unspecified fracture of shaft of left tibia, initial encounter for closed fracture: Secondary | ICD-10-CM | POA: Diagnosis present

## 2019-07-14 DIAGNOSIS — D62 Acute posthemorrhagic anemia: Secondary | ICD-10-CM

## 2019-07-14 DIAGNOSIS — N189 Chronic kidney disease, unspecified: Secondary | ICD-10-CM | POA: Diagnosis present

## 2019-07-14 DIAGNOSIS — M810 Age-related osteoporosis without current pathological fracture: Secondary | ICD-10-CM | POA: Diagnosis present

## 2019-07-14 DIAGNOSIS — K219 Gastro-esophageal reflux disease without esophagitis: Secondary | ICD-10-CM | POA: Diagnosis present

## 2019-07-14 DIAGNOSIS — E559 Vitamin D deficiency, unspecified: Secondary | ICD-10-CM

## 2019-07-14 DIAGNOSIS — I10 Essential (primary) hypertension: Secondary | ICD-10-CM | POA: Diagnosis present

## 2019-07-14 HISTORY — DX: Unspecified fracture of shaft of left tibia, initial encounter for closed fracture: S82.202A

## 2019-07-14 HISTORY — DX: Vitamin D deficiency, unspecified: E55.9

## 2019-07-14 LAB — HEPATIC FUNCTION PANEL
ALT: 8 U/L (ref 0–44)
AST: 28 U/L (ref 15–41)
Albumin: 2.7 g/dL — ABNORMAL LOW (ref 3.5–5.0)
Alkaline Phosphatase: 56 U/L (ref 38–126)
Bilirubin, Direct: <0.1 mg/dL (ref 0.0–0.2)
Total Bilirubin: 0.2 mg/dL — ABNORMAL LOW (ref 0.3–1.2)
Total Protein: 5.2 g/dL — ABNORMAL LOW (ref 6.5–8.1)

## 2019-07-14 LAB — PROTIME-INR
INR: 1 (ref 0.8–1.2)
Prothrombin Time: 13.2 seconds (ref 11.4–15.2)

## 2019-07-14 LAB — CBC
HCT: 24.2 % — ABNORMAL LOW (ref 36.0–46.0)
Hemoglobin: 7.9 g/dL — ABNORMAL LOW (ref 12.0–15.0)
MCH: 27.4 pg (ref 26.0–34.0)
MCHC: 32.6 g/dL (ref 30.0–36.0)
MCV: 84 fL (ref 80.0–100.0)
Platelets: 227 10*3/uL (ref 150–400)
RBC: 2.88 MIL/uL — ABNORMAL LOW (ref 3.87–5.11)
RDW: 15.5 % (ref 11.5–15.5)
WBC: 15.3 10*3/uL — ABNORMAL HIGH (ref 4.0–10.5)
nRBC: 0 % (ref 0.0–0.2)

## 2019-07-14 LAB — RENAL FUNCTION PANEL
Albumin: 2.7 g/dL — ABNORMAL LOW (ref 3.5–5.0)
Anion gap: 10 (ref 5–15)
BUN: 21 mg/dL (ref 8–23)
CO2: 30 mmol/L (ref 22–32)
Calcium: 9.4 mg/dL (ref 8.9–10.3)
Chloride: 99 mmol/L (ref 98–111)
Creatinine, Ser: 1.35 mg/dL — ABNORMAL HIGH (ref 0.44–1.00)
GFR calc Af Amer: 45 mL/min — ABNORMAL LOW (ref >60)
GFR calc non Af Amer: 39 mL/min — ABNORMAL LOW (ref >60)
Glucose, Bld: 142 mg/dL — ABNORMAL HIGH (ref 70–99)
Phosphorus: 1.9 mg/dL — ABNORMAL LOW (ref 2.5–4.6)
Potassium: 4.7 mmol/L (ref 3.5–5.1)
Sodium: 139 mmol/L (ref 135–145)

## 2019-07-14 LAB — ABO/RH: ABO/RH(D): A NEG

## 2019-07-14 LAB — VITAMIN D 25 HYDROXY (VIT D DEFICIENCY, FRACTURES): Vit D, 25-Hydroxy: 27.19 ng/mL — ABNORMAL LOW (ref 30–100)

## 2019-07-14 SURGERY — OPEN REDUCTION INTERNAL FIXATION (ORIF) TIBIAL PLATEAU
Anesthesia: General | Laterality: Left

## 2019-07-14 MED ORDER — CEFAZOLIN SODIUM-DEXTROSE 2-4 GM/100ML-% IV SOLN
2.0000 g | INTRAVENOUS | Status: AC
  Administered 2019-07-15: 2 g via INTRAVENOUS
  Filled 2019-07-14: qty 100

## 2019-07-14 MED ORDER — VITAMIN D 25 MCG (1000 UNIT) PO TABS
2000.0000 [IU] | ORAL_TABLET | Freq: Two times a day (BID) | ORAL | Status: DC
  Administered 2019-07-14 – 2019-07-24 (×20): 2000 [IU] via ORAL
  Filled 2019-07-14 (×20): qty 2

## 2019-07-14 MED ORDER — ASCORBIC ACID 500 MG PO TABS
500.0000 mg | ORAL_TABLET | Freq: Every day | ORAL | Status: DC
  Administered 2019-07-14 – 2019-07-24 (×10): 500 mg via ORAL
  Filled 2019-07-14 (×10): qty 1

## 2019-07-14 NOTE — Plan of Care (Signed)
  Problem: Education: Goal: Knowledge of General Education information will improve Description: Including pain rating scale, medication(s)/side effects and non-pharmacologic comfort measures Outcome: Progressing   Problem: Clinical Measurements: Goal: Respiratory complications will improve Outcome: Progressing   Problem: Nutrition: Goal: Adequate nutrition will be maintained Outcome: Progressing   Problem: Coping: Goal: Level of anxiety will decrease Outcome: Progressing   Problem: Elimination: Goal: Will not experience complications related to urinary retention Outcome: Progressing   Problem: Pain Managment: Goal: General experience of comfort will improve Outcome: Progressing Note: Tolerating pain to left lower extremity well, has not needed anything for pain today, leg has been elevated and iced on and off all day today   Problem: Safety: Goal: Ability to remain free from injury will improve Outcome: Progressing   Problem: Physical Regulation: Goal: Postoperative complications will be avoided or minimized Outcome: Progressing Note: Hgb 7.9 this morning, 2 units of blood ordered in preparation for surgery tomorrow, 2nd unit currently infusing   Problem: Pain Management: Goal: Pain level will decrease with appropriate interventions Outcome: Progressing

## 2019-07-14 NOTE — Plan of Care (Signed)

## 2019-07-14 NOTE — Anesthesia Preprocedure Evaluation (Signed)
Anesthesia Evaluation  Patient identified by MRN, date of birth, ID band Patient awake    Reviewed: Allergy & Precautions, H&P , NPO status , Patient's Chart, lab work & pertinent test results, reviewed documented beta blocker date and time   History of Anesthesia Complications Negative for: history of anesthetic complications  Airway Mallampati: II  TM Distance: >3 FB Neck ROM: Full    Dental  (+) Edentulous Upper, Edentulous Lower   Pulmonary neg pulmonary ROS,  07/11/2019 SARS coronavirus NEG   breath sounds clear to auscultation       Cardiovascular hypertension, Pt. on medications and Pt. on home beta blockers (-) angina Rhythm:Regular Rate:Normal  paroxysmal SVT in 2016. Seen by cardiology. Echo unremarkable   Neuro/Psych H/o brain tumor: Schwannoma  Neuromuscular disease negative psych ROS   GI/Hepatic GERD  Medicated and Controlled,(+) Cirrhosis  (no alcohol in 10 years)      ,   Endo/Other  negative endocrine ROS  Renal/GU negative Renal ROS     Musculoskeletal negative musculoskeletal ROS (+)   Abdominal   Peds  Hematology  (+) Blood dyscrasia, anemia ,   Anesthesia Other Findings   Reproductive/Obstetrics negative OB ROS                             Anesthesia Physical  Anesthesia Plan  ASA: III  Anesthesia Plan: General   Post-op Pain Management:    Induction: Intravenous  PONV Risk Score and Plan: 3 and Ondansetron, Dexamethasone and Treatment may vary due to age or medical condition  Airway Management Planned: Oral ETT and LMA  Additional Equipment: None  Intra-op Plan:   Post-operative Plan: Extubation in OR  Informed Consent: I have reviewed the patients History and Physical, chart, labs and discussed the procedure including the risks, benefits and alternatives for the proposed anesthesia with the patient or authorized representative who has indicated  his/her understanding and acceptance.       Plan Discussed with: CRNA, Surgeon and Anesthesiologist  Anesthesia Plan Comments:         Anesthesia Quick Evaluation

## 2019-07-14 NOTE — Plan of Care (Deleted)
  Problem: Education: Goal: Knowledge of General Education information will improve Description: Including pain rating scale, medication(s)/side effects and non-pharmacologic comfort measures 07/14/2019 0255 by Trixie Deis, RN Outcome: Progressing 07/14/2019 0155 by Trixie Deis, RN Outcome: Progressing   Problem: Activity: Goal: Risk for activity intolerance will decrease 07/14/2019 0255 by Trixie Deis, RN Outcome: Progressing 07/14/2019 0155 by Trixie Deis, RN Outcome: Progressing   Problem: Pain Managment: Goal: General experience of comfort will improve 07/14/2019 0255 by Trixie Deis, RN Outcome: Progressing 07/14/2019 0155 by Trixie Deis, RN Outcome: Progressing   Problem: Safety: Goal: Ability to remain free from injury will improve 07/14/2019 0255 by Trixie Deis, RN Outcome: Progressing 07/14/2019 0155 by Trixie Deis, RN Outcome: Progressing   Problem: Skin Integrity: Goal: Risk for impaired skin integrity will decrease 07/14/2019 0255 by Trixie Deis, RN Outcome: Progressing 07/14/2019 0155 by Trixie Deis, RN Outcome: Progressing

## 2019-07-14 NOTE — Consult Note (Signed)
Orthopaedic Trauma Service (OTS) Consult   Patient ID: Alexis Nichols MRN: [id number] DOB/AGE: Dec 27, 1945 73 y.o.   Reason for Consult: complex left tibial plateau and shaft fractures Referring Physician: Marchia Bond, MD (orthopaedics)   HPI: Alexis Nichols is an 73 y.o. white female with history notable for recent acoustic neuroma excision at [facility], chronic kidney disease (stable over the last several years), history of cirrhosis (normal LFTs and no issues by patient's report), hypertension and GERD who sustained a ground-level fall on Christmas Day.  Patient tripped on a dog bed resulting in injury to her left leg.  Patient felt and heard a pop in her left leg immediately.  She had immediate onset of pain and inability to bear weight.  She was initially brought to [facility] where she was found to have a complex left bicondylar tibial plateau fracture with shaft extension.  Due to the complexity of the injury she was transferred to [facility] for further evaluation.  Patient was seen and evaluated by Dr. [name] who is on-call on the day that she was transferred.  Due to the injury she was taken to the operating room for placement of a spanning external fixator.  Dr. [name] does assert that this injury is beyond the scope of his practice for definitive management and has such asked the orthopedic trauma service to consult for definitive management as he feels that it is appropriate for her to be definitively treated by an orthopedic fellowship trained surgeon.  Patient seen and evaluated by the orthopedic trauma service on 07/14/2019.  She is seen and evaluated on 5 N. room 5.  Her chart was reviewed by myself last night and some adjustments were made in terms of labs, medications and fluids  Patient seen and evaluated in 5 N. room 5.  She is comfortable.  No specific complaints.  States her pain is well controlled.  She did have some increased pain last  night but it is abated nicely.  Current medications are helping.  Denies any additional injuries elsewhere.  She denies any numbness or tingling in her left leg.  No increase in pain. She does report a little increase in reflux but it has been improved with Maalox and Protonix  Since her admission her BUN and creatinine have been trending up.  It looks like her baseline creatinine is about 1.2 or so. IV fluids have been increased to 100 cc an hour overnight with LR.  Her H&H is also drifting downwards and we will proceed with transfusion today.  She did not receive any IV contrast.  She did receive 24 hours of NSAIDs.  No other severely nephrotoxic drugs identified  Patient lives alone in a single-story house but does have about 5 stairs to gain entry.  She does not use any assistive devices at baseline.  She is very active at baseline and walking frequently.  She does have access to walker, cane and wheelchair.  1 daughter lives about 10 minutes away and the other lives about 30 minutes away both of from work.  She does not really have any other additional help.  She is agreeable to SNF  Patient did have a bone density scan back in 2014.  At that time her worst T score was -3.4 which was of her left forearm.  Left femoral neck and right femoral neck were -3.2 and -3.3 respectively.  She does not recall being on any type  of medications for osteoporosis.  Currently on vitamin D per medical record and patient report.   Patient has a remote history of smoking and drinking.  Quit both of these over 10 years ago  Past Medical History:  Diagnosis Date  . Acoustic neuroma ([place])   . CKD (chronic kidney disease)   . Fracture of tibial shaft, left, closed 07/14/2019  . GERD (gastroesophageal reflux disease)   . HTN (hypertension)   . Osteoporosis   . Vitamin D insufficiency 07/14/2019    Past Surgical History:  Procedure Laterality Date  . BRAIN TUMOR EXCISION  11/2018   excision of acoustic neuroma    . EXTERNAL FIXATION LEG Left 07/12/2019   Procedure: EXTERNAL FIXATION LEG;  Surgeon: Marchia Bond, MD;  Location: [facility];  Service: Orthopedics;  Laterality: Left;    History reviewed. No pertinent family history.  Social History:  reports that she has never smoked. She has never used smokeless tobacco. She reports that she does not drink alcohol. No history on file for drug.  Allergies: No Known Allergies  Medications: I have reviewed the patient's current medications. Current Meds  Medication Sig  . aspirin 81 MG EC tablet Take 81 mg by mouth daily.  . metoprolol tartrate (LOPRESSOR) 25 MG tablet Take 25 mg by mouth daily.   Marland Kitchen omeprazole (PRILOSEC) 40 MG capsule Take 40 mg by mouth daily.  . solifenacin (VESICARE) 10 MG tablet Take 10 mg by mouth daily.   Marland Kitchen spironolactone (ALDACTONE) 50 MG tablet Take 50 mg by mouth daily.      Results for orders placed or performed during the hospital encounter of 07/11/19 (from the past 48 hour(s))  CBC     Status: Abnormal   Collection Time: 07/12/19  1:25 PM  Result Value Ref Range   WBC 11.3 (H) 4.0 - 10.5 K/uL   RBC 3.51 (L) 3.87 - 5.11 MIL/uL   Hemoglobin 9.7 (L) 12.0 - 15.0 g/dL   HCT 30.1 (L) 36.0 - 46.0 %   MCV 85.8 80.0 - 100.0 fL   MCH 27.6 26.0 - 34.0 pg   MCHC 32.2 30.0 - 36.0 g/dL   RDW 14.9 11.5 - 15.5 %   Platelets 207 150 - 400 K/uL   nRBC 0.0 0.0 - 0.2 %    Comment: Performed at [facility], [place] [address], [place], [place] [address]  Creatinine, serum     Status: Abnormal   Collection Time: 07/12/19  1:25 PM  Result Value Ref Range   Creatinine, Ser 1.27 (H) 0.44 - 1.00 mg/dL   GFR calc non Af Amer 42 (L) >60 mL/min   GFR calc Af Amer 48 (L) >60 mL/min    Comment: Performed at [place] [address] [place],  [address]  CBC     Status: Abnormal   Collection Time: 07/13/19  4:46 AM  Result Value Ref Range   WBC 12.7 (H) 4.0 - 10.5 K/uL   RBC 3.20 (L) 3.87 - 5.11 MIL/uL   Hemoglobin  8.7 (L) 12.0 - 15.0 g/dL   HCT 27.7 (L) 36.0 - 46.0 %   MCV 86.6 80.0 - 100.0 fL   MCH 27.2 26.0 - 34.0 pg   MCHC 31.4 30.0 - 36.0 g/dL   RDW 15.0 11.5 - 15.5 %   Platelets 231 150 - 400 K/uL   nRBC 0.0 0.0 - 0.2 %    Comment: Performed at [facility], [place],  [place] [id number]  Basic metabolic panel     Status: Abnormal   Collection Time: 07/13/19  4:46 AM  Result Value Ref Range   Sodium 135 135 - 145 mmol/L   Potassium 4.1 3.5 - 5.1 mmol/L   Chloride 98 98 - 111 mmol/L   CO2 28 22 - 32 mmol/L   Glucose, Bld 168 (H) 70 - 99 mg/dL   BUN 16 8 - 23 mg/dL   Creatinine, Ser 1.43 (H) 0.44 - 1.00 mg/dL   Calcium 9.1 8.9 - 10.3 mg/dL   GFR calc non Af Amer 36 (L) >60 mL/min   GFR calc Af Amer 42 (L) >60 mL/min   Anion gap 9 5 - 15    Comment: Performed at [facility], [place] [address], [place], [place] [address]  Hemoglobin and hematocrit, blood     Status: Abnormal   Collection Time: 07/13/19 10:03 PM  Result Value Ref Range   Hemoglobin 7.7 (L) 12.0 - 15.0 g/dL   HCT 24.0 (L) 36.0 - 46.0 %    Comment: Performed at [facility], [place] [address], [place], [place] [address]  APTT     Status: None   Collection Time: 07/13/19 10:03 PM  Result Value Ref Range   aPTT 31 24 - 36 seconds    Comment: Performed at [facility] [address] [place], [place] [address]  Prepare RBC     Status: None   Collection Time: 07/13/19 10:03 PM  Result Value Ref Range   Order Confirmation      ORDER PROCESSED BY BLOOD BANK Performed at [facility], [place] [address], [place], [place] [address]   Type and screen [place]     Status: None (Preliminary result)   Collection Time: 07/13/19 10:03 PM  Result Value Ref Range   ABO/RH(D) A NEG    Antibody Screen NEG    Sample Expiration 07/16/2019,2359    Unit Number [id number]    Blood Component Type RED CELLS,LR    Unit division 00    Status of Unit ALLOCATED    Transfusion  Status OK TO TRANSFUSE    Crossmatch Result      Compatible Performed at [facility], [place] [address], [place], [place] [address]    Unit Number [id number]    Blood Component Type RED CELLS,LR    Unit division 00    Status of Unit ALLOCATED    Transfusion Status OK TO TRANSFUSE    Crossmatch Result Compatible   ABO/Rh     Status: None   Collection Time: 07/13/19 10:03 PM  Result Value Ref Range   ABO/RH(D)      A NEG Performed at [facility] [address] [place], [place] [address]   CBC     Status: Abnormal   Collection Time: 07/14/19  3:28 AM  Result Value Ref Range   WBC 15.3 (H) 4.0 - 10.5 K/uL   RBC 2.88 (L) 3.87 - 5.11 MIL/uL   Hemoglobin 7.9 (L) 12.0 - 15.0 g/dL   HCT 24.2 (L) 36.0 - 46.0 %   MCV 84.0 80.0 - 100.0 fL   MCH 27.4 26.0 - 34.0 pg   MCHC 32.6 30.0 - 36.0 g/dL   RDW 15.5 11.5 - 15.5 %   Platelets 227 150 - 400 K/uL   nRBC 0.0 0.0 - 0.2 %    Comment: Performed at [facility], [place]  [address], [place], [place] [address]  Renal function panel     Status: Abnormal   Collection Time: 07/14/19  3:28 AM  Result Value Ref Range   Sodium 139 135 - 145 mmol/L   Potassium 4.7 3.5 - 5.1 mmol/L   Chloride 99 98 - 111 mmol/L   CO2 30 22 - 32 mmol/L   Glucose, Bld 142 (H) 70 - 99 mg/dL   BUN 21 8 - 23 mg/dL   Creatinine, Ser 1.35 (H) 0.44 - 1.00 mg/dL   Calcium 9.4 8.9 - 10.3 mg/dL   Phosphorus 1.9 (L) 2.5 - 4.6 mg/dL   Albumin 2.7 (L) 3.5 - 5.0 g/dL   GFR calc non Af Amer 39 (L) >60 mL/min   GFR calc Af Amer 45 (L) >60 mL/min   Anion gap 10 5 - 15    Comment: Performed at [facility] [address] [place], [place] [address]  Hepatic function panel     Status: Abnormal   Collection Time: 07/14/19  3:28 AM  Result Value Ref Range   Total Protein 5.2 (L) 6.5 - 8.1 g/dL   Albumin 2.7 (L) 3.5 - 5.0 g/dL   AST 28 15 - 41 U/L   ALT 8 0 - 44 U/L   Alkaline Phosphatase 56 38 - 126 U/L   Total Bilirubin 0.2 (L) 0.3 - 1.2 mg/dL    Bilirubin, Direct <0.1 0.0 - 0.2 mg/dL   Indirect Bilirubin NOT CALCULATED 0.3 - 0.9 mg/dL    Comment: Performed at [facility] [address], [place], [place] [address]  Protime-INR     Status: None   Collection Time: 07/14/19  3:28 AM  Result Value Ref Range   Prothrombin Time 13.2 11.4 - 15.2 seconds   INR 1.0 0.8 - 1.2    Comment: (NOTE) INR goal varies based on device and disease states. Performed at [facility], [place] [address] [place], [place] [address]   VITAMIN D 25 Hydroxy (Vit-D Deficiency, Fractures)     Status: Abnormal   Collection Time: 07/14/19  3:28 AM  Result Value Ref Range   Vit D, 25-Hydroxy 27.19 (L) 30 - 100 ng/mL    Comment: (NOTE) Vitamin D deficiency has been defined by the [place] practice guideline as a level of serum 25-OH  vitamin D less than 20 ng/mL (1,2). The Endocrine Society went on to  further define vitamin D insufficiency as a level between 21 and 29  ng/mL (2). 1. IOM (Institute of Medicine). 2010. Dietary reference intakes for  calcium and D. [place]: The [employer]. 2. Holick MF, Binkley Findlay, Bischoff-Ferrari HA, et al. Evaluation,  treatment, and prevention of vitamin D deficiency: an Endocrine  Society clinical practice guideline, JCEM. 2011 Jul; 96(7): 1911-30. Performed at [facility], [place] [address] [place], [place] [address]     DG Tibia/Fibula Left  Result Date: 07/12/2019 CLINICAL DATA:  73 year old female undergoing external fixator placement. EXAM: DG C-ARM 1-60 MIN; LEFT TIBIA AND FIBULA - 2 VIEW FLUOROSCOPY TIME:  Fluoroscopy Time:  0 minutes 51 seconds Radiation Exposure Index (if provided by the fluoroscopic device): Number of Acquired Spot Images: 0 COMPARISON:  Left tib-fib series 07/11/2019. FINDINGS: Eight intraoperative fluoroscopic spot views of the left tib fib with placement of anterior cortical screws through what appears to be the  distal left tibia shaft and probably the left femur shaft. Highly comminuted proximal left tibia  shaft fracture redemonstrated. Impaction of the medial tibial plateau again noted. Associated fracture of the left fibula shaft less apparent. IMPRESSION: Intraoperative images from external fixator device placement about the left lower extremity. Electronically Signed   By: Genevie Ann M.D.   On: 07/12/2019 13:10   DG Knee Left Port  Result Date: 07/13/2019 CLINICAL DATA:  Left tibial plateau fracture. EXAM: PORTABLE LEFT KNEE - 1-2 VIEW COMPARISON:  07/11/2019 FINDINGS: Similar appearance of the depressed medial tibial plateau fracture with comminuted fracture of the proximal tibial diaphysis incompletely visualized. Comminuted proximal fibula fracture again noted, also incompletely visualized on this study. Lipohemarthrosis again noted. IMPRESSION: Depressed medial tibial plateau fracture with comminuted fractures of proximal tibia and fibula. No substantial interval change in alignment or position of fracture fragments in the interval since prior study. Electronically Signed   By: Misty Stanley M.D.   On: 07/13/2019 14:06   DG Tibia/Fibula Left Port  Result Date: 07/13/2019 CLINICAL DATA:  Tibial plateau fracture with comminuted fractures of the proximal tibia and fibula. External fixator. EXAM: PORTABLE LEFT TIBIA AND FIBULA - 2 VIEW COMPARISON:  07/11/2019 FINDINGS: External fixator device noted. The bony over riding of the dominant fracture fragment seen on the 07/11/2019 exam has been reduced in the interval. Dominant distal fracture fragment distracted medially approximately 8 mm relative to the dominant proximal fragments. Slight apex anterior angulation noted at the tibial fracture site. IMPRESSION: Interval reduction of bony over riding of the dominant tibial fracture fragments. Electronically Signed   By: Misty Stanley M.D.   On: 07/13/2019 14:09   DG C-Arm 1-60 Min  Result Date:  07/12/2019 CLINICAL DATA:  73 year old female undergoing external fixator placement. EXAM: DG C-ARM 1-60 MIN; LEFT TIBIA AND FIBULA - 2 VIEW FLUOROSCOPY TIME:  Fluoroscopy Time:  0 minutes 51 seconds Radiation Exposure Index (if provided by the fluoroscopic device): Number of Acquired Spot Images: 0 COMPARISON:  Left tib-fib series 07/11/2019. FINDINGS: Eight intraoperative fluoroscopic spot views of the left tib fib with placement of anterior cortical screws through what appears to be the distal left tibia shaft and probably the left femur shaft. Highly comminuted proximal left tibia shaft fracture redemonstrated. Impaction of the medial tibial plateau again noted. Associated fracture of the left fibula shaft less apparent. IMPRESSION: Intraoperative images from external fixator device placement about the left lower extremity. Electronically Signed   By: Genevie Ann M.D.   On: 07/12/2019 13:10    Review of Systems  Constitutional: Negative for chills and fever.  Respiratory: Negative for shortness of breath and wheezing.   Cardiovascular: Negative for chest pain and palpitations.  Gastrointestinal: Negative for abdominal pain, nausea and vomiting.  Genitourinary: Negative for dysuria.  Neurological: Negative for tingling and sensory change.   Blood pressure (!) 103/50, pulse 66, temperature 98.5 F (36.9 C), temperature source Oral, resp. rate 17, height 5\' 2"  (1.575 m), weight 53.1 kg, SpO2 91 %. Physical Exam Vitals and nursing note reviewed. Exam conducted with a chaperone present.  Constitutional:      General: She is awake. She is not in acute distress.    Comments: Very pleasant female, no acute distress, appears appropriate for stated age.  HENT:     Head: Normocephalic and atraumatic.  Cardiovascular:     Rate and Rhythm: Normal rate and regular rhythm.     Heart sounds: S1 normal and S2 normal.  Pulmonary:     Effort: Pulmonary effort is normal. No respiratory distress.     Breath  sounds: Normal breath sounds.  Abdominal:     Comments: Soft, nontender, nondistended, + BS   Musculoskeletal:     Comments: Pelvis      no traumatic wounds or rash, no ecchymosis, stable to manual stress, nontender  Left Lower Extremity  Inspection:    Spanning external fixator to L knee     Ace wrap from foot to thigh     Strike through noted along tibial pinsites     Mild swelling to foot      Bony eval:    + TTP mid and proximal tibia     Foot and ankle are nontender    Patella, femur and hip are nontender   Soft tissue:    Did not remove dressing completely but did part it along the medial and lateral aspects of the proximal tibia    Did not appreciate any fracture blisters    Moderate swelling present compared to contralateral side but skin does wrinkle medially and laterally    Moderate knee effusion      pinsites L thigh are stable   Sensation:     DPN, SPN, TN sensation intact to light touch  Motor:     EHL, FHL, lesser toe motor intact    Ankle flexion, extension, inversion and eversion grossly intact   Vascular:    Ext warm     + DP pulse    Compartments are soft, no pain out of proportion with passive stretching   Right Lower Extremity              no open wounds or lesions, no swelling or ecchymosis   Nontender hip, knee, ankle and foot             No crepitus or gross motion noted with manipulation of the R leg  No knee or ankle effusion             No pain with axial loading or logrolling of the hip.   Knee stable to varus/ valgus and anterior/posterior stress             No pain with manipulation of the ankle or foot             No blocks to motion noted  Sens DPN, SPN, TN intact  Motor EHL, FHL, lesser toe motor, Ext, flex, evers 5/5  DP 2+, No significant edema             Compartments are soft and nontender, no pain with passive stretching  Bilateral upper Extremities       shoulder, elbow, wrist, digits- no skin wounds, nontender, no  instability, no blocks to motion  Sens  Ax/R/M/U intact  Mot   Ax/ R/ PIN/ M/ AIN/ U intact  Rad 2+    Skin:    General: Skin is warm.     Capillary Refill: Capillary refill takes less than 2 seconds.  Neurological:     Mental Status: She is alert and oriented to person, place, and time.     Comments: Unable to assess gait   Psychiatric:        Attention and Perception: Attention normal.        Mood and Affect: Mood normal.        Behavior: Behavior is cooperative.      Assessment/Plan:  73 year old female ground-level fall with complex left bicondylar tibial plateau fracture with shaft extension  -Ground-level fall  -Complex left bicondylar tibial plateau fracture with shaft  extension and severe depression of medial plateau  S/p external fixation   Patient will need definitive ORIF.  Plan for plate osteosynthesis tomorrow.  She will need dual approach with medial and lateral plates and will likely require bone grafting.  She will be nonweightbearing 8 weeks postop  Unrestricted range of motion of left knee to begin immediate postop  She is at increased risk for the development of posttraumatic arthritis given the nature of her injury   We will reevaluate her swelling in the morning.  It is possible that we may need to delay several more days to allow for swelling to resolve even further.  Currently I do think she is stable enough from a soft tissue standpoint to proceed with surgery but this could change over the next 24 hours.  Have instructed nursing staff to be aggressive with ice and elevation over the next 24 hours.   PT and OT evaluations postop   Pt agreeable to snf     - Pain management:  Continue with current pain management  Minimize narcotics  Will schedule some Tylenol as her LFTs look normal  No NSAIDs due to her renal function  - ABL anemia/Hemodynamics  H&H is continued to trend downward  Will transfuse 2 units of packed red blood cells today in  preparation for surgery.  This should also help her renal function from a volume standpoint   coags look good   - Medical issues   Home medications  - DVT/PE prophylaxis:  Lovenox-renal dose   Will likely discharge on oral anticoagulation due to her renal function  - ID:   Perioperative antibiotics  - Metabolic Bone Disease:  Osteoporosis and vitamin D insufficiency   Patient's last bone density scan was in 2014.  There was one ordered for November 2019 which does not appear to have been done.  Would definitely recommend bone density scan in the next 4 to 8 weeks.  Her fracture does appear to be suggestive of worsening osteoporosis.  She would likely benefit from pharmacologic treatment of her osteoporosis.  Agent selection would be dependent on the degree of change between her scan from 2014 to the most recent one   Start vitamin D and vitamin C supplementation  Calcium levels look appropriate.  Continue with dietary acquisition of calcium through whole foods  - Activity:  Nonweightbearing left leg  Up with assistance  - FEN/GI prophylaxis/Foley/Lines:  Regular diet  LR @ 50 cc/hr (as she is receiving blood today)  - Impediments to fracture healing:  Osteoporosis   Vitamin d insufficiency   Low energy fracture  - Dispo:  OR tomorrow for ORIF L tibial plateau and shaft  SW consult for SNF     Jari Pigg, PA-C [phone] (C) 07/14/2019, 9:48 AM  [facility] [place] [address] [phone] Domingo Sep (F)

## 2019-07-14 NOTE — Progress Notes (Addendum)
Ainsley Spinner secure chatted me about giving 2 units of blood. Released orders, did not hear from the blood bank, called blood bank and they stated the order was for during surgery if needed. Contacted Dr. Eddie Dibbles to verify time of administration, waiting to hear back. Will continue to monitor pt.

## 2019-07-15 ENCOUNTER — Inpatient Hospital Stay (HOSPITAL_COMMUNITY): Payer: Medicare HMO

## 2019-07-15 ENCOUNTER — Encounter (HOSPITAL_COMMUNITY): Payer: Self-pay | Admitting: Orthopedic Surgery

## 2019-07-15 ENCOUNTER — Inpatient Hospital Stay (HOSPITAL_COMMUNITY): Payer: Medicare HMO | Admitting: Anesthesiology

## 2019-07-15 ENCOUNTER — Encounter (HOSPITAL_COMMUNITY)
Admission: AD | Disposition: A | Discharge status: Skilled Nursing Facility | Payer: Self-pay | Source: Other Acute Inpatient Hospital | Attending: Orthopedic Surgery

## 2019-07-15 DIAGNOSIS — D333 Benign neoplasm of cranial nerves: Secondary | ICD-10-CM

## 2019-07-15 DIAGNOSIS — Z7409 Other reduced mobility: Secondary | ICD-10-CM

## 2019-07-15 HISTORY — PX: EXTERNAL FIXATION REMOVAL: SHX5040

## 2019-07-15 HISTORY — PX: ORIF TIBIA PLATEAU: SHX2132

## 2019-07-15 HISTORY — PX: ORIF TIBIA FRACTURE: SHX5416

## 2019-07-15 LAB — CBC
HCT: 35.3 % — ABNORMAL LOW (ref 36.0–46.0)
Hemoglobin: 12 g/dL (ref 12.0–15.0)
MCH: 28.9 pg (ref 26.0–34.0)
MCHC: 34 g/dL (ref 30.0–36.0)
MCV: 85.1 fL (ref 80.0–100.0)
Platelets: 244 10*3/uL (ref 150–400)
RBC: 4.15 MIL/uL (ref 3.87–5.11)
RDW: 14.9 % (ref 11.5–15.5)
WBC: 13.1 10*3/uL — ABNORMAL HIGH (ref 4.0–10.5)
nRBC: 0.2 % (ref 0.0–0.2)

## 2019-07-15 LAB — BPAM RBC
Blood Product Expiration Date: 202101122359
Blood Product Expiration Date: 202101122359
ISSUE DATE / TIME: 202012281133
ISSUE DATE / TIME: 202012281613
Unit Type and Rh: 600
Unit Type and Rh: 600

## 2019-07-15 LAB — TYPE AND SCREEN
ABO/RH(D): A NEG
Antibody Screen: NEGATIVE
Unit division: 0
Unit division: 0

## 2019-07-15 LAB — PHOSPHORUS: Phosphorus: 1.3 mg/dL — ABNORMAL LOW (ref 2.5–4.6)

## 2019-07-15 LAB — MAGNESIUM: Magnesium: 2.1 mg/dL (ref 1.7–2.4)

## 2019-07-15 SURGERY — OPEN REDUCTION INTERNAL FIXATION (ORIF) TIBIA FRACTURE
Anesthesia: General | Site: Leg Lower | Laterality: Left

## 2019-07-15 MED ORDER — GLYCOPYRROLATE 0.2 MG/ML IJ SOLN
INTRAMUSCULAR | Status: DC | PRN
  Administered 2019-07-15: .2 mg via INTRAVENOUS

## 2019-07-15 MED ORDER — ACETAMINOPHEN 160 MG/5ML PO SOLN: 325.0000 mg | ORAL | Status: DC | PRN

## 2019-07-15 MED ORDER — PHENYLEPHRINE 40 MCG/ML (10ML) SYRINGE FOR IV PUSH (FOR BLOOD PRESSURE SUPPORT)
PREFILLED_SYRINGE | INTRAVENOUS | Status: AC
  Filled 2019-07-15: qty 10

## 2019-07-15 MED ORDER — ONDANSETRON HCL 4 MG/2ML IJ SOLN
4.0000 mg | Freq: Once | INTRAMUSCULAR | Status: AC | PRN
  Administered 2019-07-15: 4 mg via INTRAVENOUS

## 2019-07-15 MED ORDER — ALBUMIN HUMAN 5 % IV SOLN: INTRAVENOUS | Status: DC | PRN

## 2019-07-15 MED ORDER — MIDAZOLAM HCL 2 MG/2ML IJ SOLN
INTRAMUSCULAR | Status: AC
  Filled 2019-07-15: qty 2

## 2019-07-15 MED ORDER — LIDOCAINE HCL (CARDIAC) PF 100 MG/5ML IV SOSY
PREFILLED_SYRINGE | INTRAVENOUS | Status: DC | PRN
  Administered 2019-07-15: 100 mg via INTRAVENOUS

## 2019-07-15 MED ORDER — 0.9 % SODIUM CHLORIDE (POUR BTL) OPTIME
TOPICAL | Status: DC | PRN
  Administered 2019-07-15: 1000 mL

## 2019-07-15 MED ORDER — DEXAMETHASONE SODIUM PHOSPHATE 10 MG/ML IJ SOLN
INTRAMUSCULAR | Status: AC
  Filled 2019-07-15: qty 1

## 2019-07-15 MED ORDER — FENTANYL CITRATE (PF) 250 MCG/5ML IJ SOLN
INTRAMUSCULAR | Status: AC
  Filled 2019-07-15: qty 5

## 2019-07-15 MED ORDER — OXYCODONE HCL 5 MG/5ML PO SOLN: 5.0000 mg | Freq: Once | ORAL | Status: DC | PRN

## 2019-07-15 MED ORDER — ROCURONIUM BROMIDE 10 MG/ML (PF) SYRINGE
PREFILLED_SYRINGE | INTRAVENOUS | Status: AC
  Filled 2019-07-15: qty 20

## 2019-07-15 MED ORDER — LIDOCAINE 2% (20 MG/ML) 5 ML SYRINGE
INTRAMUSCULAR | Status: AC
  Filled 2019-07-15: qty 5

## 2019-07-15 MED ORDER — GLYCOPYRROLATE PF 0.2 MG/ML IJ SOSY
PREFILLED_SYRINGE | INTRAMUSCULAR | Status: AC
  Filled 2019-07-15: qty 1

## 2019-07-15 MED ORDER — ADULT MULTIVITAMIN W/MINERALS CH
1.0000 | ORAL_TABLET | Freq: Every day | ORAL | Status: DC
  Administered 2019-07-15 – 2019-07-24 (×10): 1 via ORAL
  Filled 2019-07-15 (×10): qty 1

## 2019-07-15 MED ORDER — PHENYLEPHRINE HCL-NACL 10-0.9 MG/250ML-% IV SOLN
INTRAVENOUS | Status: DC | PRN
  Administered 2019-07-15: 40 ug/min via INTRAVENOUS

## 2019-07-15 MED ORDER — PROPOFOL 10 MG/ML IV BOLUS
INTRAVENOUS | Status: DC | PRN
  Administered 2019-07-15: 100 mg via INTRAVENOUS

## 2019-07-15 MED ORDER — ONDANSETRON HCL 4 MG/2ML IJ SOLN
INTRAMUSCULAR | Status: DC | PRN
  Administered 2019-07-15: 4 mg via INTRAVENOUS

## 2019-07-15 MED ORDER — ENSURE ENLIVE PO LIQD
237.0000 mL | Freq: Every day | ORAL | Status: DC
  Administered 2019-07-15 – 2019-07-23 (×8): 237 mL via ORAL

## 2019-07-15 MED ORDER — FENTANYL CITRATE (PF) 100 MCG/2ML IJ SOLN
INTRAMUSCULAR | Status: DC | PRN
  Administered 2019-07-15: 50 ug via INTRAVENOUS
  Administered 2019-07-15: 100 ug via INTRAVENOUS
  Administered 2019-07-15 (×4): 50 ug via INTRAVENOUS

## 2019-07-15 MED ORDER — MEPERIDINE HCL 25 MG/ML IJ SOLN: 6.2500 mg | INTRAMUSCULAR | Status: DC | PRN

## 2019-07-15 MED ORDER — FENTANYL CITRATE (PF) 100 MCG/2ML IJ SOLN
INTRAMUSCULAR | Status: AC
  Filled 2019-07-15: qty 2

## 2019-07-15 MED ORDER — PHENYLEPHRINE HCL (PRESSORS) 10 MG/ML IV SOLN
INTRAVENOUS | Status: DC | PRN
  Administered 2019-07-15: 120 ug via INTRAVENOUS

## 2019-07-15 MED ORDER — OXYCODONE HCL 5 MG PO TABS: 5.0000 mg | ORAL_TABLET | Freq: Once | ORAL | Status: DC | PRN

## 2019-07-15 MED ORDER — CEFAZOLIN SODIUM-DEXTROSE 1-4 GM/50ML-% IV SOLN
1.0000 g | Freq: Two times a day (BID) | INTRAVENOUS | Status: AC
  Administered 2019-07-15 – 2019-07-16 (×2): 1 g via INTRAVENOUS
  Filled 2019-07-15 (×2): qty 50

## 2019-07-15 MED ORDER — PROPOFOL 10 MG/ML IV BOLUS
INTRAVENOUS | Status: AC
  Filled 2019-07-15: qty 20

## 2019-07-15 MED ORDER — ACETAMINOPHEN 325 MG PO TABS: 325.0000 mg | ORAL_TABLET | ORAL | Status: DC | PRN

## 2019-07-15 MED ORDER — SUGAMMADEX SODIUM 200 MG/2ML IV SOLN
INTRAVENOUS | Status: DC | PRN
  Administered 2019-07-15: 200 mg via INTRAVENOUS

## 2019-07-15 MED ORDER — ONDANSETRON HCL 4 MG/2ML IJ SOLN
INTRAMUSCULAR | Status: AC
  Filled 2019-07-15: qty 2

## 2019-07-15 MED ORDER — FENTANYL CITRATE (PF) 100 MCG/2ML IJ SOLN
25.0000 ug | INTRAMUSCULAR | Status: DC | PRN
  Administered 2019-07-15: 12:00:00 25 ug via INTRAVENOUS

## 2019-07-15 MED ORDER — DEXAMETHASONE SODIUM PHOSPHATE 10 MG/ML IJ SOLN
INTRAMUSCULAR | Status: DC | PRN
  Administered 2019-07-15: 5 mg via INTRAVENOUS

## 2019-07-15 MED ORDER — MIDAZOLAM HCL 5 MG/5ML IJ SOLN
INTRAMUSCULAR | Status: DC | PRN
  Administered 2019-07-15: 2 mg via INTRAVENOUS

## 2019-07-15 MED ORDER — ROCURONIUM BROMIDE 100 MG/10ML IV SOLN
INTRAVENOUS | Status: DC | PRN
  Administered 2019-07-15: 70 mg via INTRAVENOUS

## 2019-07-15 MED ORDER — LACTATED RINGERS IV SOLN: INTRAVENOUS | Status: DC

## 2019-07-15 MED ORDER — ACETAMINOPHEN 500 MG PO TABS
500.0000 mg | ORAL_TABLET | Freq: Two times a day (BID) | ORAL | Status: DC
  Administered 2019-07-15 – 2019-07-24 (×19): 500 mg via ORAL
  Filled 2019-07-15 (×19): qty 1

## 2019-07-15 SURGICAL SUPPLY — 104 items
BANDAGE ESMARK 6X9 LF (GAUZE/BANDAGES/DRESSINGS) ×2 IMPLANT
BIT DRILL 2.5MM SMALL QC EVOS (BIT) ×2 IMPLANT
BIT DRILL 3.3 LONG (BIT) ×4 IMPLANT
BIT DRILL QC 2.5MM SHRT EVO SM (DRILL) ×2 IMPLANT
BIT DRILL QC 3.3X195 (BIT) ×4 IMPLANT
BLADE CLIPPER SURG (BLADE) IMPLANT
BLADE SURG 10 STRL SS (BLADE) ×4 IMPLANT
BLADE SURG 15 STRL LF DISP TIS (BLADE) ×2 IMPLANT
BLADE SURG 15 STRL SS (BLADE) ×2
BNDG COHESIVE 4X5 TAN STRL (GAUZE/BANDAGES/DRESSINGS) ×4 IMPLANT
BNDG ELASTIC 4X5.8 VLCR STR LF (GAUZE/BANDAGES/DRESSINGS) ×8 IMPLANT
BNDG ELASTIC 6X5.8 VLCR STR LF (GAUZE/BANDAGES/DRESSINGS) ×8 IMPLANT
BNDG ESMARK 6X9 LF (GAUZE/BANDAGES/DRESSINGS) ×4
BNDG GAUZE ELAST 4 BULKY (GAUZE/BANDAGES/DRESSINGS) ×4 IMPLANT
BONE CANC CHIPS 40CC CAN1/2 (Bone Implant) ×8 IMPLANT
BRUSH SCRUB EZ PLAIN DRY (MISCELLANEOUS) ×8 IMPLANT
CANISTER SUCT 3000ML PPV (MISCELLANEOUS) ×4 IMPLANT
CAP LOCK NCB (Cap) ×12 IMPLANT
CHIPS CANC BONE 40CC CAN1/2 (Bone Implant) ×4 IMPLANT
COVER MAYO STAND STRL (DRAPES) ×4 IMPLANT
COVER SURGICAL LIGHT HANDLE (MISCELLANEOUS) ×4 IMPLANT
COVER WAND RF STERILE (DRAPES) IMPLANT
CUFF TOURN SGL QUICK 34 (TOURNIQUET CUFF) ×2
CUFF TRNQT CYL 34X4.125X (TOURNIQUET CUFF) ×2 IMPLANT
DRAIN PENROSE 18X1/4 LTX STRL (WOUND CARE) ×4 IMPLANT
DRAPE C-ARM 42X72 X-RAY (DRAPES) ×4 IMPLANT
DRAPE C-ARMOR (DRAPES) ×4 IMPLANT
DRAPE HALF SHEET 40X57 (DRAPES) ×8 IMPLANT
DRAPE INCISE IOBAN 66X45 STRL (DRAPES) ×4 IMPLANT
DRAPE ORTHO SPLIT 77X108 STRL (DRAPES) ×4
DRAPE SURG ORHT 6 SPLT 77X108 (DRAPES) ×4 IMPLANT
DRAPE U-SHAPE 47X51 STRL (DRAPES) ×4 IMPLANT
DRILL 2.5MM SMALL QC EVOS (BIT) ×4
DRILL QC 2.5MM SHORT EVOS SM (DRILL) ×4
DRSG ADAPTIC 3X8 NADH LF (GAUZE/BANDAGES/DRESSINGS) IMPLANT
DRSG MEPITEL 3X4 ME34 (GAUZE/BANDAGES/DRESSINGS) ×8 IMPLANT
DRSG PAD ABDOMINAL 8X10 ST (GAUZE/BANDAGES/DRESSINGS) IMPLANT
ELECT REM PT RETURN 9FT ADLT (ELECTROSURGICAL) ×4
ELECTRODE REM PT RTRN 9FT ADLT (ELECTROSURGICAL) ×2 IMPLANT
GAUZE SPONGE 4X4 12PLY STRL (GAUZE/BANDAGES/DRESSINGS) ×4 IMPLANT
GLOVE BIO SURGEON STRL SZ7.5 (GLOVE) ×8 IMPLANT
GLOVE BIO SURGEON STRL SZ8 (GLOVE) ×8 IMPLANT
GLOVE BIOGEL PI IND STRL 7.5 (GLOVE) ×4 IMPLANT
GLOVE BIOGEL PI IND STRL 8 (GLOVE) ×4 IMPLANT
GLOVE BIOGEL PI INDICATOR 7.5 (GLOVE) ×4
GLOVE BIOGEL PI INDICATOR 8 (GLOVE) ×4
GLOVE XGUARD RR 2 7.5 (GLOVE) ×2 IMPLANT
GLOVE XGUARD RR2 7.5 (GLOVE) ×2
GOWN STRL REUS W/ TWL LRG LVL3 (GOWN DISPOSABLE) ×4 IMPLANT
GOWN STRL REUS W/ TWL XL LVL3 (GOWN DISPOSABLE) ×2 IMPLANT
GOWN STRL REUS W/TWL LRG LVL3 (GOWN DISPOSABLE) ×4
GOWN STRL REUS W/TWL XL LVL3 (GOWN DISPOSABLE) ×2
IMMOBILIZER KNEE 22 UNIV (SOFTGOODS) IMPLANT
K-WIRE 1.6 (WIRE) ×2
K-WIRE 2.0 (WIRE) ×2
K-WIRE FX150X1.6XTROC PNT (WIRE) ×2
K-WIRE FXSTD 280X2XNS SS (WIRE) ×2
KIT BASIN OR (CUSTOM PROCEDURE TRAY) ×4 IMPLANT
KIT TURNOVER KIT B (KITS) ×4 IMPLANT
KWIRE FX150X1.6XTROC PNT (WIRE) ×2 IMPLANT
KWIRE FXSTD 280X2XNS SS (WIRE) ×2 IMPLANT
MANIFOLD NEPTUNE II (INSTRUMENTS) IMPLANT
NDL SUT 6 .5 CRC .975X.05 MAYO (NEEDLE) IMPLANT
NEEDLE MAYO TAPER (NEEDLE)
NS IRRIG 1000ML POUR BTL (IV SOLUTION) ×4 IMPLANT
PACK ORTHO EXTREMITY (CUSTOM PROCEDURE TRAY) ×4 IMPLANT
PAD ARMBOARD 7.5X6 YLW CONV (MISCELLANEOUS) ×8 IMPLANT
PAD CAST 4YDX4 CTTN HI CHSV (CAST SUPPLIES) ×2 IMPLANT
PADDING CAST COTTON 4X4 STRL (CAST SUPPLIES) ×2
PADDING CAST COTTON 6X4 STRL (CAST SUPPLIES) ×4 IMPLANT
PLATE LOCK TIB NCB 292 3H LT (Plate) ×4 IMPLANT
PLATE TIB PA EVOS 3.5X75 4H L (Plate) ×4 IMPLANT
SCREW LOCK 3.5X36MM EVOS (Screw) ×4 IMPLANT
SCREW LOCK 3.5X60MM EVOS (Screw) ×4 IMPLANT
SCREW LOCK 3.5X65MM (Screw) ×8 IMPLANT
SCREW LOCK 30X3.5XSTSTRL (Screw) ×4 IMPLANT
SCREW LOCKING 3.5X30 (Screw) ×4 IMPLANT
SCREW NCB 4.0 28MM (Screw) ×8 IMPLANT
SCREW NCB 4.0MX30M (Screw) ×8 IMPLANT
SCREW NCB 4.0X75 CORT S/T (Screw) ×8 IMPLANT
SCREW PROX ST NCB 4X80 (Screw) ×4 IMPLANT
SPONGE LAP 18X18 RF (DISPOSABLE) ×4 IMPLANT
STAPLER VISISTAT 35W (STAPLE) ×4 IMPLANT
STOCKINETTE IMPERVIOUS LG (DRAPES) ×4 IMPLANT
SUCTION FRAZIER HANDLE 10FR (MISCELLANEOUS) ×2
SUCTION TUBE FRAZIER 10FR DISP (MISCELLANEOUS) ×2 IMPLANT
SUT ETHILON 2 0 FS 18 (SUTURE) ×8 IMPLANT
SUT ETHILON 3 0 PS 1 (SUTURE) IMPLANT
SUT PROLENE 0 CT 2 (SUTURE) IMPLANT
SUT VIC AB 0 CT1 27 (SUTURE) ×4
SUT VIC AB 0 CT1 27XBRD ANBCTR (SUTURE) ×4 IMPLANT
SUT VIC AB 1 CT1 27 (SUTURE) ×2
SUT VIC AB 1 CT1 27XBRD ANBCTR (SUTURE) ×2 IMPLANT
SUT VIC AB 2-0 CT1 27 (SUTURE) ×4
SUT VIC AB 2-0 CT1 TAPERPNT 27 (SUTURE) ×4 IMPLANT
SYR 10ML LL (SYRINGE) IMPLANT
SYR 5ML LL (SYRINGE) ×12 IMPLANT
TOWEL GREEN STERILE (TOWEL DISPOSABLE) ×4 IMPLANT
TOWEL GREEN STERILE FF (TOWEL DISPOSABLE) ×4 IMPLANT
TRAY FOLEY MTR SLVR 16FR STAT (SET/KITS/TRAYS/PACK) IMPLANT
TUBE CONNECTING 12'X1/4 (SUCTIONS) ×1
TUBE CONNECTING 12X1/4 (SUCTIONS) ×3 IMPLANT
WATER STERILE IRR 1000ML POUR (IV SOLUTION) ×4 IMPLANT
YANKAUER SUCT BULB TIP NO VENT (SUCTIONS) ×4 IMPLANT

## 2019-07-15 NOTE — Op Note (Signed)
07/15/2019  12:34 PM  PATIENT:  Alexis Nichols  73 y.o. female  PRE-OPERATIVE DIAGNOSIS:   1. Left Bicondylar Tibial Plateau Fracture 2. Left Tibial Shaft Fracture  POST-OPERATIVE DIAGNOSIS:   1. Left Bicondylar Tibial Plateau Fracture 2. Left Tibial Shaft Fracture  PROCEDURE:  Procedure(s): 1. OPEN REDUCTION INTERNAL FIXATION (ORIF) TIBIA FRACTURE (Left) 2. OPEN REDUCTION INTERNAL FIXATION (ORIF) BICONDYLAR TIBIAL PLATEAU (Left) 3. Removal External Fixation Leg (Left) LEFT TIBIA  SURGEON:  Surgeon(s) and Role:    Altamese Loco, MD - Primary  PHYSICIAN ASSISTANT: Ainsley Spinner, PA-C  ANESTHESIA:   general  EBL:  150 mL   BLOOD ADMINISTERED:none  DRAINS: none   LOCAL MEDICATIONS USED:  NONE  SPECIMEN:  No Specimen  DISPOSITION OF SPECIMEN:  N/A  COUNTS:  YES  TOURNIQUET:  * No tourniquets in log *  DICTATION: Epic  PLAN OF CARE: Admit to inpatient   PATIENT DISPOSITION:  PACU - hemodynamically stable.   Delay start of Pharmacological VTE agent (>24hrs) due to surgical blood loss or risk of bleeding: no     BRIEF SUMMARY AND INDICATION FOR PROCEDURE:  Patient is a 73 y.o.-year- old with a tibial plateau fracture, treated provisionally with external fixation, aggressive ice, elevation, and active motion of the foot and toes to facilitate resolution of soft tissue swelling.  We did discuss with the patient the risks and benefits of surgical treatment including the potential for arthritis, nerve injury, vessel injury, loss of motion, DVT, PE, heart attack, stroke, symptomatic hardware, need for further surgery, and multiple others.  The patient acknowledged these risks and wished to proceed.   BRIEF SUMMARY OF PROCEDURE:  After administration of preoperative antibiotics, the patient was taken to the operating room.  General anesthesia was induced. Scrubbing of the skin and pin sites with chlorhexidine sponge performed and then the lower extremity prepped and draped  in usual sterile fashion using a chlorhexidine wash and betadine scrub and paint. A timeout was performed.  We did retain the fixator to protect the neurovascular structures during prepping. Once time-out was held, the leg was isolated from the clamps with towels and the fixator removed.    We performed a medial approach first without the tourniquet, identifying the saphenous nerve proximally and retracting it anteriorly.  The pes tendons were also mobilized and secured with a Penrose drain after incising the sartorius sheath. We preserved all periosteal attachments to the subchondral segment.  The plate was positioned and then pinned with a k-wire under fluoro. A curvilinear incision was made extending laterally over Gerdy's tubercle. Dissection was carried down where the soft tissues were left intact to the lateral plateau and rim. The joint surface was markedly depressed.  We then released some of the anterior extensors to enable the plate to fit along the proximal shaft. A trapdoor was made in the lateral tibial metaphysis.  Through this, I introduced a series of tamps and with my assistant pulling traction, I was able to elevate the articular surface in sequential fashion while watching on x-ray.  I was able to restore joint reduction and placed 80 cc of cancellous chips into the metaphysis below the subchondral segments to provide additional  support and eliminate the bone void. Once I had restored appropriate height to the plateau, I then placed the plate laterally and secured aubchondral fixation across the lateral and medial compartments with the proximal row. I then used application of stress under fluoro to dial in the alignment while my assistant  drilled a screw into the shaft to secure the plate in appropriate alignment. At this point, we additional fixation in the shaft and then locking caps on the proximal row to create locked fixation. We then turned attention back to the medial side, where the  plate was compressed to the bone and proximal locking row fixation placed then a standard screw distally then two locked screws given her poor bone quality. My assistant was careful to control alignment throughout by using traction and bending forces. He also assited with retraction. All wounds were irrigated thoroughly. Final AP and LAT fluoro images showed restoration of alignment, reduction, and hardware position.   Once more, wounds were irrigated and then a standard layered closure performed, 0 Vicryl, 2-0 Vicryl, and 3-0 nylon for the skin.  Sterile gently compressive dressing was applied and in knee immobilizer.  The patient was taken to the PACU in stable condition.   PROGNOSIS: The patient will be transitioned into a hinged knee brace with unrestricted range of motion and this will begin immediately.  Orders entered for nonweightbearing on the operative extremity, pharmacologic DVT prophylaxis, and mobilization with PT and OT. After discharge, we will plan to see the patient back in about 2 weeks for removal of sutures and we will continue to follow throughout the hospital stay.         Astrid Divine. Marcelino Scot, M.D.

## 2019-07-15 NOTE — Brief Op Note (Signed)
07/15/2019  12:34 PM  PATIENT:  Alexis Nichols  73 y.o. female  PRE-OPERATIVE DIAGNOSIS:   1. Left Bicondylar Tibial Plateau Fracture 2. Left Tibial Shaft Fracture  POST-OPERATIVE DIAGNOSIS:   1. Left Bicondylar Tibial Plateau Fracture 2. Left Tibial Shaft Fracture  PROCEDURE:  Procedure(s): 1. OPEN REDUCTION INTERNAL FIXATION (ORIF) TIBIA FRACTURE (Left) 2. OPEN REDUCTION INTERNAL FIXATION (ORIF) BICONDYLAR TIBIAL PLATEAU (Left) 3. Removal External Fixation Leg (Left) LEFT TIBIA  SURGEON:  Surgeon(s) and Role:    Altamese [place], MD - Primary  PHYSICIAN ASSISTANT: Ainsley Spinner, PA-C  ANESTHESIA:   general  EBL:  150 mL   BLOOD ADMINISTERED:none  DRAINS: none   LOCAL MEDICATIONS USED:  NONE  SPECIMEN:  No Specimen  DISPOSITION OF SPECIMEN:  N/A  COUNTS:  YES  TOURNIQUET:  * No tourniquets in log *  DICTATION: EPIC  PLAN OF CARE: Admit to inpatient   PATIENT DISPOSITION:  PACU - hemodynamically stable.   Delay start of Pharmacological VTE agent (>24hrs) due to surgical blood loss or risk of bleeding: no

## 2019-07-15 NOTE — Plan of Care (Signed)

## 2019-07-15 NOTE — Progress Notes (Signed)
Initial Nutrition Assessment  DOCUMENTATION CODES:   Not applicable  INTERVENTION:   -Ensure Enlive po daily, each supplement provides 350 kcal and 20 grams of protein -MVI with minerals daily  NUTRITION DIAGNOSIS:   Increased nutrient needs related to post-op healing as evidenced by estimated needs.  GOAL:   Patient will meet greater than or equal to 90% of their needs  MONITOR:   PO intake, Supplement acceptance, Labs, Weight trends, Skin, I & O's  REASON FOR ASSESSMENT:   Consult Assessment of nutrition requirement/status  ASSESSMENT:   Alexis Nichols is a 73 y.o. female with past medical history significant for vestibular schwannoma status post resection, CKD, and cirrhosis. Patient slipped on a dog bed which caused her to fall onto her left leg. She heard a pop in her knee and felt immediate pain. She was taken to [facility] Emergency Department by EMS. X-ray shows comminuted fracture of left tib-fib extending proximally towards her knee. CT scan was then obtained and showed comminuted fracture of proximal tibia with involvement of tibial plateau and depressed fracture. Patient was transferred to [facility] for further evaluation.  Pt admitted with lt tibial plateau and tibial shaft fracture.   12/26- s/p PROCEDURE: Application of multiplanar external fixator to the left femur and tibia  Reviewed I/O's: -1.9 L x 24 hours and -363 ml since admission  UOP: 2.6 L x 24 hours  Attempted to speak with pt x 2, however, pt out of room in OR at times of attempted visits. RD unable to obtain further nutrition-related history or complete nutrition-focused physical exam at this time.   Pt to receive ORIF today.   Pt previously on regular diet. Noted meal completion 100%.   Unsure of accuracy of wts; suspect wt may be skewed related to external fixator.   Albumin has a half-life of 21 days and is strongly affected by stress response and inflammatory process, therefore,  do not expect to see an improvement in this lab value during acute hospitalization. When a patient presents with low albumin, it is likely skewed due to the acute inflammatory response.  Unless it is suspected that patient had poor PO intake or malnutrition prior to admission, then RD should not be consulted solely for low albumin. Note that low albumin is no longer used to diagnose malnutrition; [place] uses the new malnutrition guidelines published by the American Society for Parenteral and Enteral Nutrition (A.S.P.E.N.) and the Academy of Nutrition and Dietetics (AND).    Labs reviewed: Phos: 1.3, Mg and K WDL.   Diet Order:   Diet Order            Diet regular Room service appropriate? Yes; Fluid consistency: Thin  Diet effective now              EDUCATION NEEDS:   No education needs have been identified at this time  Skin:  Skin Assessment: Skin Integrity Issues: Skin Integrity Issues:: Incisions Incisions: closed lt leg  Last BM:  07/10/19  Height:   Ht Readings from Last 1 Encounters:  07/13/19 5\' 2"  (1.575 m)    Weight:   Wt Readings from Last 1 Encounters:  07/13/19 53.1 kg    Ideal Body Weight:  50 kg  BMI:  Body mass index is 21.4 kg/m.  Estimated Nutritional Needs:   Kcal:  1650-1850  Protein:  80-95 grams  Fluid:  > 1.6 L    Kenady Doxtater A. Jimmye Norman, RD, LDN, [place] Registered Dietitian II Certified Diabetes Care  and Education Specialist Pager: [phone] After hours Pager: [phone]

## 2019-07-15 NOTE — Transfer of Care (Signed)
Immediate Anesthesia Transfer of Care Note  Patient: Alexis Nichols  Procedure(s) Performed: OPEN REDUCTION INTERNAL FIXATION (ORIF) TIBIA FRACTURE (Left ) OPEN REDUCTION INTERNAL FIXATION (ORIF) TIBIAL PLATEAU (Left ) Removal External Fixation Leg (Left Leg Lower)  Patient Location: PACU  Anesthesia Type:General  Level of Consciousness: drowsy  Airway & Oxygen Therapy: Patient Spontanous Breathing and Patient connected to nasal cannula oxygen  Post-op Assessment: Report given to RN, Post -op Vital signs reviewed and stable and Patient moving all extremities  Post vital signs: Reviewed and stable  Last Vitals:  Vitals Value Taken Time  BP 135/73 07/15/19 1146  Temp    Pulse 110 07/15/19 1152  Resp 21 07/15/19 1152  SpO2 100 % 07/15/19 1152  Vitals shown include unvalidated device data.  Last Pain:  Vitals:   07/15/19 0403  TempSrc: Oral  PainSc: Asleep      Patients Stated Pain Goal: 3 ([id number])  Complications: No apparent anesthesia complications

## 2019-07-15 NOTE — Plan of Care (Signed)
  Problem: Education: Goal: Knowledge of General Education information will improve Description Including pain rating scale, medication(s)/side effects and non-pharmacologic comfort measures Outcome: Progressing   Problem: Health Behavior/Discharge Planning: Goal: Ability to manage health-related needs will improve Outcome: Progressing   Problem: Clinical Measurements: Goal: Will remain free from infection Outcome: Progressing   Problem: Nutrition: Goal: Adequate nutrition will be maintained Outcome: Progressing   Problem: Coping: Goal: Level of anxiety will decrease Outcome: Progressing   

## 2019-07-15 NOTE — Anesthesia Procedure Notes (Signed)
Procedure Name: Intubation Date/Time: 07/15/2019 8:28 AM Performed by: Suzzanne Brunkhorst T, CRNA Pre-anesthesia Checklist: Patient identified, Emergency Drugs available, Suction available and Patient being monitored Patient Re-evaluated:Patient Re-evaluated prior to induction Oxygen Delivery Method: Circle system utilized Preoxygenation: Pre-oxygenation with 100% oxygen Induction Type: IV induction Ventilation: Mask ventilation without difficulty Laryngoscope Size: Miller and 2 Grade View: Grade I Tube type: Oral Tube size: 7.5 mm Number of attempts: 1 Airway Equipment and Method: Stylet and Oral airway Placement Confirmation: ETT inserted through vocal cords under direct vision,  positive ETCO2 and breath sounds checked- equal and bilateral Secured at: 21 cm Tube secured with: Tape Dental Injury: Teeth and Oropharynx as per pre-operative assessment

## 2019-07-15 NOTE — Care Management (Addendum)
CM consult acknowledged for ST SNF for rehab. Awaiting PT/OT notes.   Midge Minium MSN, RN, NCM-BC, ACM-RN [phone]

## 2019-07-16 ENCOUNTER — Encounter: Payer: Self-pay | Admitting: *Deleted

## 2019-07-16 LAB — CBC
HCT: 31 % — ABNORMAL LOW (ref 36.0–46.0)
Hemoglobin: 10.4 g/dL — ABNORMAL LOW (ref 12.0–15.0)
MCH: 29.1 pg (ref 26.0–34.0)
MCHC: 33.5 g/dL (ref 30.0–36.0)
MCV: 86.8 fL (ref 80.0–100.0)
Platelets: 232 10*3/uL (ref 150–400)
RBC: 3.57 MIL/uL — ABNORMAL LOW (ref 3.87–5.11)
RDW: 15.5 % (ref 11.5–15.5)
WBC: 11.2 10*3/uL — ABNORMAL HIGH (ref 4.0–10.5)
nRBC: 0 % (ref 0.0–0.2)

## 2019-07-16 LAB — COMPREHENSIVE METABOLIC PANEL
ALT: 8 U/L (ref 0–44)
AST: 23 U/L (ref 15–41)
Albumin: 2.8 g/dL — ABNORMAL LOW (ref 3.5–5.0)
Alkaline Phosphatase: 59 U/L (ref 38–126)
Anion gap: 10 (ref 5–15)
BUN: 19 mg/dL (ref 8–23)
CO2: 28 mmol/L (ref 22–32)
Calcium: 9.2 mg/dL (ref 8.9–10.3)
Chloride: 99 mmol/L (ref 98–111)
Creatinine, Ser: 1.2 mg/dL — ABNORMAL HIGH (ref 0.44–1.00)
GFR calc Af Amer: 52 mL/min — ABNORMAL LOW (ref >60)
GFR calc non Af Amer: 44 mL/min — ABNORMAL LOW (ref >60)
Glucose, Bld: 163 mg/dL — ABNORMAL HIGH (ref 70–99)
Potassium: 4.6 mmol/L (ref 3.5–5.1)
Sodium: 137 mmol/L (ref 135–145)
Total Bilirubin: 0.8 mg/dL (ref 0.3–1.2)
Total Protein: 5.1 g/dL — ABNORMAL LOW (ref 6.5–8.1)

## 2019-07-16 MED ORDER — K PHOS MONO-SOD PHOS DI & MONO 155-852-130 MG PO TABS
250.0000 mg | ORAL_TABLET | Freq: Two times a day (BID) | ORAL | Status: DC
  Administered 2019-07-16 (×2): 250 mg via ORAL
  Filled 2019-07-16 (×4): qty 1

## 2019-07-16 NOTE — Anesthesia Postprocedure Evaluation (Signed)
Anesthesia Post Note  Patient: Alexis Nichols  Procedure(s) Performed: OPEN REDUCTION INTERNAL FIXATION (ORIF) TIBIA FRACTURE (Left ) OPEN REDUCTION INTERNAL FIXATION (ORIF) TIBIAL PLATEAU (Left ) Removal External Fixation Leg (Left Leg Lower)     Patient location during evaluation: PACU Anesthesia Type: General Level of consciousness: awake and alert Pain management: pain level controlled Vital Signs Assessment: post-procedure vital signs reviewed and stable Respiratory status: spontaneous breathing, nonlabored ventilation, respiratory function stable and patient connected to nasal cannula oxygen Cardiovascular status: blood pressure returned to baseline and stable Postop Assessment: no apparent nausea or vomiting Anesthetic complications: no    Last Vitals:  Vitals:   07/16/19 0740 07/16/19 1408  BP: (!) 115/47 (!) 127/45  Pulse: 81 89  Resp: 16 15  Temp: 37.2 C 36.7 C  SpO2: 93% 96%    Last Pain:  Vitals:   07/16/19 1408  TempSrc: Oral  PainSc:    Pain Goal: Patients Stated Pain Goal: 2 (07/15/19 1200)                 Katilin Raynes

## 2019-07-16 NOTE — NC FL2 (Signed)
[place] LEVEL OF CARE SCREENING TOOL     IDENTIFICATION  Patient Name: Alexis Nichols Birthdate: 11/10/1945 Sex: female Admission Date (Current Location): 07/11/2019  [facility] and [place] Number:  Engineering geologist and Address:  [facility] [place] [place] [address] [place], [place] [address]      Provider Number: [id number]  Attending Physician Name and Address:  Altamese Atherton, MD  Relative Name and Phone Number:  Letta Moynahan (daughter) [phone]    Current Level of Care: Hospital Recommended Level of Care: [place] Prior Approval Number:    Date Approved/Denied:   PASRR Number: [id number]  Discharge Plan: SNF    Current Diagnoses: Patient Active Problem List   Diagnosis Date Noted  . 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 Impaired functional mobility, balance, gait, and endurance 07/15/2019  . Fracture of tibial shaft, left, closed 07/14/2019  . Acute blood loss anemia 07/14/2019  . Vitamin D insufficiency 07/14/2019  . Osteoporosis   . HTN (hypertension)   . GERD (gastroesophageal reflux disease)   . CKD (chronic kidney disease)   . Tibial plateau fracture, left 07/11/2019    Orientation RESPIRATION BLADDER Height & Weight     Self, Time, Situation, Place  Normal Incontinent, Indwelling catheter Weight: 53.1 kg Height:  5\' 2"  (157.5 cm)  BEHAVIORAL SYMPTOMS/MOOD NEUROLOGICAL BOWEL NUTRITION STATUS  Other (Comment)(N/A) (N/A) Continent Diet(see discharge summary)  AMBULATORY STATUS COMMUNICATION OF NEEDS Skin   Extensive Assist Verbally Surgical wounds(Lefft leg)                       Personal Care Assistance Level of Assistance  Bathing, Feeding, Dressing Bathing Assistance: Maximum assistance Feeding assistance: Limited assistance Dressing Assistance: Maximum assistance     Functional Limitations Info  Sight, Hearing, Speech Sight Info: Impaired Hearing Info: 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 acetaminophen (TYLENOL) tablet 500 mg  500 mg Oral Q12H Ainsley Spinner, PA-C   500 mg at 07/16/19 1007  . alum & mag hydroxide-simeth (MAALOX/MYLANTA) 200-200-20 MG/5ML suspension 30 mL  30 mL Oral Q4H PRN Ainsley Spinner, PA-C   30 mL at 07/16/19 1229  . ascorbic acid (VITAMIN C) tablet 500 mg  500 mg Oral Daily Ainsley Spinner, PA-C   500 mg at 07/16/19 1007  . bisacodyl (DULCOLAX) suppository 10 mg  10 mg Rectal Daily PRN Ainsley Spinner, PA-C      . cholecalciferol (VITAMIN D3) tablet 2,000 Units  2,000 Units Oral BID Ainsley Spinner, PA-C   2,000 Units at 07/16/19 1007  . darifenacin (ENABLEX) 24 hr tablet 7.5 mg  7.5 mg Oral Daily Ainsley Spinner, PA-C   7.5 mg at 07/15/19 1728  . diphenhydrAMINE (BENADRYL) 12.5 MG/5ML elixir 12.5-25 mg  12.5-25 mg Oral Q4H PRN Ainsley Spinner, PA-C      . docusate sodium (COLACE) capsule 100 mg  100 mg Oral BID Ainsley Spinner,  PA-C   100 mg at 07/16/19 1007  . enoxaparin (LOVENOX) injection 30 mg  30 mg Subcutaneous Q24H Ainsley Spinner, PA-C   30 mg at 07/16/19 1007  . feeding supplement (ENSURE ENLIVE) (ENSURE ENLIVE) liquid 237 mL  237 mL Oral Q2000 Altamese [place], MD   237 mL at 07/15/19 2017  . ferrous sulfate tablet 325 mg  325 mg Oral TID PC Ainsley Spinner, PA-C   325 mg at 07/16/19 1229  . HYDROcodone-acetaminophen (NORCO) 7.5-325 MG per tablet 1-2 tablet  1-2 tablet Oral Q4H PRN Ainsley Spinner, PA-C   1 tablet at 07/15/19 0206  . HYDROcodone-acetaminophen (NORCO/VICODIN) 5-325 MG per  tablet 1-2 tablet  1-2 tablet Oral Q4H PRN Ainsley Spinner, PA-C   1 tablet at 07/15/19 2205  . lactated ringers infusion   Intravenous Continuous Ainsley Spinner, PA-C 50 mL/hr at 07/15/19 0740 New Bag at 07/15/19 1123  . lactated ringers infusion   Intravenous Continuous Ainsley Spinner, PA-C 50 mL/hr at 07/16/19 0636 New Bag at 07/16/19 0636  . magnesium citrate solution 1 Bottle  1 Bottle Oral Once PRN Ainsley Spinner, PA-C      . menthol-cetylpyridinium (CEPACOL) lozenge 3 mg  1 lozenge Oral PRN Ainsley Spinner, PA-C       Or  . phenol (CHLORASEPTIC) mouth spray 1 spray  1 spray Mouth/Throat PRN Ainsley Spinner, PA-C      . methocarbamol (ROBAXIN) tablet 500 mg  500 mg Oral Q6H PRN Ainsley Spinner, PA-C   500 mg at 07/12/19 2305   Or  . methocarbamol (ROBAXIN) 500 mg in dextrose 5 % 50 mL IVPB  500 mg Intravenous Q6H PRN Ainsley Spinner, PA-C      . metoprolol tartrate (LOPRESSOR) tablet 25 mg  25 mg Oral Daily Ainsley Spinner, PA-C   25 mg at 07/11/19 2142  . morphine 2 MG/ML injection 0.5-1 mg  0.5-1 mg Intravenous Q2H PRN Ainsley Spinner, PA-C      . multivitamin with minerals tablet 1 tablet  1 tablet Oral Daily Altamese [place], MD   1 tablet at 07/16/19 1006  . ondansetron (ZOFRAN) tablet 4 mg  4 mg Oral Q6H PRN Ainsley Spinner, PA-C       Or  . ondansetron [facility]) injection 4 mg  4 mg Intravenous Q6H PRN Ainsley Spinner, PA-C      . pantoprazole (PROTONIX) EC tablet 80 mg  80 mg Oral Daily Ainsley Spinner, PA-C   80 mg at 07/16/19 1007  . phosphorus (K PHOS NEUTRAL) tablet 250 mg  250 mg Oral BID Ainsley Spinner, PA-C   250 mg at 07/16/19 1346  . polyethylene glycol (MIRALAX / GLYCOLAX) packet 17 g  17 g Oral Daily PRN Ainsley Spinner, PA-C      . senna (SENOKOT) tablet 8.6 mg  1 tablet Oral BID Ainsley Spinner, PA-C   8.6 mg at 07/16/19 1006  . spironolactone (ALDACTONE) tablet 50 mg  50 mg Oral Daily Ainsley Spinner, PA-C   50 mg at 07/16/19 1007     Discharge Medications: Please see discharge summary for a list of discharge  medications.  Relevant Imaging Results:  Relevant Lab Results:   Additional Information SSN: [id number]  Midge Minium MSN, RN, NCM-BC, ACM-RN [phone]

## 2019-07-16 NOTE — Progress Notes (Signed)
Orthopedic Tech Progress Note Patient Details:  Alexis Nichols [birth year] [id number] Called in order to [facility] for a ROM KNEE BRACE minus 10 ROM. Patient ID: Alexis Nichols, female   DOB: 1945/09/19, 73 y.o.   MRN: [id number]   [name] 07/16/2019, 7:38 AM

## 2019-07-16 NOTE — Progress Notes (Signed)
RN called pharmacy about missing darifenacin at 1000, 1145, and again at 1700. Pharmacy reports that the drug is being brought over from [facility] and is not at [facility] yet. RN left number with pharmacy to call when the med arrives. RN will continue to monitor. [place], RN 07/16/2019 5:13 PM

## 2019-07-16 NOTE — Progress Notes (Signed)
Orthopaedic Trauma Service Progress Note  Patient ID: RAYNIYA BRINGAS MRN: [id number] DOB/AGE: 1946/02/13 74 y.o.  Subjective:  Doing well Pain controlled No acute issues Tolerating diet She and family are hoping for CIR  Was having some balance issues prior to her acoustic neuroma excision (may have been due to her neuroma) but continues to have balance issues now 7 months post excision   Using bed pan w/o difficulty   Renal function appears to be at baseline  + vitamin d insufficiency  + hypophosphatemia  Review of Systems  Constitutional: Negative for chills and fever.  Respiratory: Negative for shortness of breath.   Cardiovascular: Negative for chest pain and palpitations.  Gastrointestinal: Negative for nausea and vomiting.  Neurological: Negative for tingling and sensory change.      Objective:   VITALS:   Vitals:   07/16/19 0020 07/16/19 0100 07/16/19 0344 07/16/19 0740  BP: (!) 117/46  107/77 (!) 115/47  Pulse: 95  92 81  Resp: 15  16 16   Temp: 99.3 F (37.4 C) 98.7 F (37.1 C) 99.5 F (37.5 C) 98.9 F (37.2 C)  TempSrc: Oral  Oral Oral  SpO2: 95%  93% 93%  Weight:      Height:        Estimated body mass index is 21.4 kg/m as calculated from the following:   Height as of this encounter: 5\' 2"  (1.575 m).   Weight as of this encounter: 53.1 kg.   Intake/Output      12/29 0701 - 12/30 0700 12/30 0701 - 12/31 0700   P.O. 240    I.V. (mL/kg) 1667.4 (31.4)    Blood     IV Piggyback 300    Total Intake(mL/kg) 2207.4 (41.6)    Urine (mL/kg/hr) 1900 (1.5)    Blood 150    Total Output 2050    Net +157.4           LABS  Results for orders placed or performed during the hospital encounter of 07/11/19 (from the past 24 hour(s))  CBC     Status: Abnormal   Collection Time: 07/16/19  4:07 AM  Result Value Ref Range   WBC 11.2 (H) 4.0 - 10.5 K/uL   RBC 3.57 (L) 3.87 - 5.11  MIL/uL   Hemoglobin 10.4 (L) 12.0 - 15.0 g/dL   HCT 31.0 (L) 36.0 - 46.0 %   MCV 86.8 80.0 - 100.0 fL   MCH 29.1 26.0 - 34.0 pg   MCHC 33.5 30.0 - 36.0 g/dL   RDW 15.5 11.5 - 15.5 %   Platelets 232 150 - 400 K/uL   nRBC 0.0 0.0 - 0.2 %  Comprehensive metabolic panel     Status: Abnormal   Collection Time: 07/16/19  4:07 AM  Result Value Ref Range   Sodium 137 135 - 145 mmol/L   Potassium 4.6 3.5 - 5.1 mmol/L   Chloride 99 98 - 111 mmol/L   CO2 28 22 - 32 mmol/L   Glucose, Bld 163 (H) 70 - 99 mg/dL   BUN 19 8 - 23 mg/dL   Creatinine, Ser 1.20 (H) 0.44 - 1.00 mg/dL   Calcium 9.2 8.9 - 10.3 mg/dL   Total Protein 5.1 (L) 6.5 - 8.1 g/dL   Albumin 2.8 (L) 3.5 - 5.0 g/dL   AST  23 15 - 41 U/L   ALT 8 0 - 44 U/L   Alkaline Phosphatase 59 38 - 126 U/L   Total Bilirubin 0.8 0.3 - 1.2 mg/dL   GFR calc non Af Amer 44 (L) >60 mL/min   GFR calc Af Amer 52 (L) >60 mL/min   Anion gap 10 5 - 15     PHYSICAL EXAM:   Gen: resting comfortably in bed, NAD, appears well Lungs: unlabored Cardiac: regular  Abd: +BS Ext:       Left Lower Extremity   Dressing c/d/i   Hinged brace fitting well, unlocked  Swelling stable  Ext warm   + DP pulse  No pain with passive stretching of lower leg compartments  EHL, FHL, lesser toe motor intact  Ankle flex, ext, inversion and eversion are intact  No DCT   Compartments are soft   Assessment/Plan: 1 Day Post-Op   Principal Problem:   Tibial plateau fracture, left Active Problems:   Osteoporosis   HTN (hypertension)   GERD (gastroesophageal reflux disease)   CKD (chronic kidney disease)   Fracture of tibial shaft, left, closed   Acute blood loss anemia   Vitamin D insufficiency   Acoustic neuroma (HCC)   Impaired functional mobility, balance, gait, and endurance   Anti-infectives (From admission, onward)   Start     Dose/Rate Route Frequency Ordered Stop   07/15/19 2000  ceFAZolin (ANCEF) IVPB 1 g/50 mL premix     1 g 100 mL/hr over 30  Minutes Intravenous Every 12 hours 07/15/19 1257 07/16/19 1959   07/15/19 0800  ceFAZolin (ANCEF) IVPB 2g/100 mL premix     2 g 200 mL/hr over 30 Minutes Intravenous On call to O.R. 07/14/19 1022 07/15/19 0830   07/12/19 1700  ceFAZolin (ANCEF) IVPB 2g/100 mL premix     2 g 200 mL/hr over 30 Minutes Intravenous Every 6 hours 07/12/19 1300 07/12/19 2331   07/12/19 0600  ceFAZolin (ANCEF) IVPB 2g/100 mL premix     2 g 200 mL/hr over 30 Minutes Intravenous On call to O.R. 07/12/19 0147 07/12/19 1125    .  POD/HD#: 27  73 year old female ground-level fall with complex left bicondylar tibial plateau fracture with shaft extension   -Ground-level fall   -Complex left bicondylar tibial plateau fracture with shaft extension and severe depression of medial plateau             S/p ORIF   NWB x 8 weeks  Unrestricted knee ROM    Hinged brace can be off for ROM    Hinged brace on when mobilizing  PT/OT evals  Dressing change tomorrow   Ice and elevate     PT- please teach HEP for L knee ROM- AROM, PROM. Prone exercises as well. No ROM restrictions.  Quad sets, SLR, LAQ, SAQ, heel slides, stretching, prone flexion and extension ok     Ankle theraband program, heel cord stretching, toe towel curls, etc   No pillows under bend of knee when at rest, ok to place under heel to help work on extension. Can also use zero knee bone foam if available  - gait disturbance/balance issues/impaired mobility   Continued issue since excision of acoustic neuroma   Increased fall risk with subsequent risk for secondary fracture  Think she needs aggressive therapy to maximize her recovery beyond what she would get at SNF                 - Pain management:  Continue with current pain management              Scheduled tylenol    PRN norco    - ABL anemia/Hemodynamics             stable  Cbc in am   Renal function improving     - Medical issues              Home medications   Vitamin d  insufficiency    Supplement   Hypophosphatemia    May be related to vitamin d insufficiency if her vitamin d insufficiency is causing hyperparathyroidism   Likely exacerbated due to chronic CKD    Check PTH       - DVT/PE prophylaxis:             Lovenox  scds  motilize  - ID:              Perioperative antibiotics completed    - Metabolic Bone Disease:             Osteoporosis and vitamin D insufficiency                         Patient's last bone density scan was in 2014.  There was one ordered for November 2019 which does not appear to have been done.  Would definitely recommend bone density scan in the next 4 to 8 weeks.  Her fracture does appear to be suggestive of worsening osteoporosis.  She would likely benefit from pharmacologic treatment of her osteoporosis.  Agent selection would be dependent on the degree of change between her scan from 2014 to the most recent one               vitamin D and vitamin C supplementation             Calcium levels look appropriate.  Continue with dietary acquisition of calcium through whole foods   Magnesium levels look good  Follow up on PTH due do hypophosphatemia    Renal disease also a factor here    - Activity:             Nonweightbearing left leg             Up with assistance   - FEN/GI prophylaxis/Foley/Lines:             Regular diet             NSL IVF    - Impediments to fracture healing:             Osteoporosis              Vitamin d insufficiency              Low energy fracture   - Dispo:             therapy evals  CIR eval   SW eval for SNF In the event that pt is not a CIR candidate     Jari Pigg, PA-C [phone] (C) 07/16/2019, 8:30 AM  [facility] [place] [address] [phone] Jenetta [phone] (F)   After 6pm on weekdays please call office number to get in touch with on call provider or refer to [facility] and look to see who is on call for the Sports  Medicine Call Group which is listed under orthopaedics   On Weekends please call office number to get  in touch with on call provider or refer to [facility] and look to see who is on call for the Sports Medicine Call Group which is listed under orthopaedics

## 2019-07-16 NOTE — Plan of Care (Signed)

## 2019-07-16 NOTE — Plan of Care (Signed)

## 2019-07-16 NOTE — Anesthesia Postprocedure Evaluation (Signed)
Anesthesia Post Note  Patient: Alexis Nichols  Procedure(s) Performed: OPEN REDUCTION INTERNAL FIXATION (ORIF) TIBIA FRACTURE (Left ) OPEN REDUCTION INTERNAL FIXATION (ORIF) TIBIAL PLATEAU (Left ) Removal External Fixation Leg (Left Leg Lower)     Patient location during evaluation: PACU Anesthesia Type: General Level of consciousness: awake and alert Pain management: pain level controlled Vital Signs Assessment: post-procedure vital signs reviewed and stable Respiratory status: spontaneous breathing, nonlabored ventilation, respiratory function stable and patient connected to nasal cannula oxygen Cardiovascular status: blood pressure returned to baseline and stable Postop Assessment: no apparent nausea or vomiting Anesthetic complications: no    Last Vitals:  Vitals:   07/16/19 0740 07/16/19 1408  BP: (!) 115/47 (!) 127/45  Pulse: 81 89  Resp: 16 15  Temp: 37.2 C 36.7 C  SpO2: 93% 96%    Last Pain:  Vitals:   07/16/19 1408  TempSrc: Oral  PainSc:    Pain Goal: Patients Stated Pain Goal: 2 (07/15/19 1200)                 Jossiah Smoak

## 2019-07-16 NOTE — Addendum Note (Signed)
Addendum  created 07/16/19 1721 by Janeece Riggers, MD   Clinical Note Signed

## 2019-07-16 NOTE — Evaluation (Signed)
Physical Therapy Evaluation Patient Details Name: Alexis Nichols MRN: [id number] DOB: 1945-09-09 Today's Date: 07/16/2019   History of Present Illness  73 yo female s/p ORIF tib / fib plateau s/p fall on dog bed. PMH: acoustic neuroma removal 11/2018, osteoporosis, HTN, CKD, impaired balance  Clinical Impression   Pt presents with LLE weakness post-operatively, difficulty performing mobility tasks, unsteadiness in standing, inability to ambulate at present, and decreased activity tolerance. Pt to benefit from acute PT to address deficits. Pt required mod assist for bed mobility and transfer OOB to [facility] and recliner, pt unable to progress to ambulation this session due to fatigue after transfers. Pt was very independent PTA, and would like to return to independence with assist from daughter as needed. PT recommending CIR to address pt goals and maximize mobility. PT to progress mobility as tolerated, and will continue to follow acutely.      Follow Up Recommendations CIR    Equipment Recommendations  Other (comment)(TBD)    Recommendations for Other Services       Precautions / Restrictions Precautions Precautions: Fall Precaution Comments: hinge brace on LLE with all transfers, does NOT need to be locked in extension. AROM and PROM appropriate for L knee Required Braces or Orthoses: Other Brace Other Brace: hinge brace Restrictions Weight Bearing Restrictions: Yes LLE Weight Bearing: Non weight bearing      Mobility  Bed Mobility Overal bed mobility: Needs Assistance Bed Mobility: Supine to Sit     Supine to sit: Min assist;HOB elevated     General bed mobility comments: pt requires mod cues for seqence and min (A) to help advance L LE. pt progressed to eob sitting with min guard (A)   Transfers Overall transfer level: Needs assistance Equipment used: Rolling walker (2 wheeled) Transfers: Sit to/from [facility] Sit to Stand: +2 physical assistance;Mod  assist;From elevated surface Stand pivot transfers: Mod assist;+2 safety/equipment;From elevated surface       General transfer comment: cues for hand placement. Pt with (A) to fully elevated off bed surface and maintain weight bearing precuations. Pt able to perform hop-to and pivot style transfer to [facility].  Ambulation/Gait             General Gait Details: unable due to pt fatigue  Stairs            Wheelchair Mobility    Modified Rankin (Stroke Patients Only)       Balance Overall balance assessment: Needs assistance Sitting-balance support: Bilateral upper extremity supported;Feet supported Sitting balance-Leahy Scale: Good     Standing balance support: Bilateral upper extremity supported;During functional activity Standing balance-Leahy Scale: Poor Standing balance comment: relies on RW                             Pertinent Vitals/Pain Pain Assessment: No/denies pain    Home Living Family/patient expects to be discharged to:: Private residence Living Arrangements: Alone Available Help at Discharge: Other (Comment);Family(CIR requested by RN ) Type of Home: Mobile home Home Access: Stairs to enter Entrance Stairs-Rails: Right;Left Entrance Stairs-Number of Steps: 6 Home Layout: One level Home Equipment: Clinical cytogeneticist - 2 wheels;Cane - single point;Wheelchair - manual;Bedside commode Additional Comments: Juliann Pulse is the name of the  dog (mixed lab )and Prissy is  the cat     Prior Function Level of Independence: Independent         Comments: daughter has been driving pt since May  Hand Dominance   Dominant Hand: Right    Extremity/Trunk Assessment   Upper Extremity Assessment Upper Extremity Assessment: Defer to OT evaluation    Lower Extremity Assessment Lower Extremity Assessment: LLE deficits/detail LLE Deficits / Details: suspected post-operative weakness; able to perform partial AROM ankle pump, quad set LLE  Sensation: WNL    Cervical / Trunk Assessment Cervical / Trunk Assessment: Normal  Communication   Communication: HOH  Cognition Arousal/Alertness: Awake/alert Behavior During Therapy: WFL for tasks assessed/performed Overall Cognitive Status: Within Functional Limits for tasks assessed                                 General Comments: states "i like to cuss" - this is normal conversational use of words       General Comments General comments (skin integrity, edema, etc.): L thigh bruising present    Exercises General Exercises - Lower Extremity Ankle Circles/Pumps: AROM;Both;10 reps;Seated Quad Sets: AROM;Both;5 reps;Seated   Assessment/Plan    PT Assessment Patient needs continued PT services  PT Problem List Decreased strength;Decreased mobility;Decreased activity tolerance;Decreased balance;Decreased knowledge of use of DME;Pain;Decreased knowledge of precautions;Decreased range of motion       PT Treatment Interventions DME instruction;Therapeutic activities;Gait training;Therapeutic exercise;Patient/family education;Balance training;Stair training;Functional mobility training    PT Goals (Current goals can be found in the Care Plan section)  Acute Rehab PT Goals Patient Stated Goal: return to independence PT Goal Formulation: With patient Time For Goal Achievement: 07/30/19 Potential to Achieve Goals: Good    Frequency Min 4X/week   Barriers to discharge        Co-evaluation PT/OT/SLP Co-Evaluation/Treatment: Yes Reason for Co-Treatment: Complexity of the patient's impairments (multi-system involvement);For patient/therapist safety;To address functional/ADL transfers PT goals addressed during session: Mobility/safety with mobility;Balance OT goals addressed during session: ADL's and self-care;Proper use of Adaptive equipment and DME       AM-PAC PT "6 Clicks" Mobility  Outcome Measure Help needed turning from your back to your side while in a  flat bed without using bedrails?: A Lot Help needed moving from lying on your back to sitting on the side of a flat bed without using bedrails?: A Lot Help needed moving to and from a bed to a chair (including a wheelchair)?: A Lot Help needed standing up from a chair using your arms (e.g., wheelchair or bedside chair)?: A Lot Help needed to walk in hospital room?: Total Help needed climbing 3-5 steps with a railing? : Total 6 Click Score: 10    End of Session Equipment Utilized During Treatment: Gait belt;Other (comment)(hinge brace) Activity Tolerance: Patient tolerated treatment well;Patient limited by fatigue Patient left: in chair;with call bell/phone within reach;with chair alarm set Nurse Communication: Mobility status PT Visit Diagnosis: History of falling (Z91.81);Unsteadiness on feet (R26.81);Difficulty in walking, not elsewhere classified (R26.2)    Time: [id number] PT Time Calculation (min) (ACUTE ONLY): 24 min   Charges:   PT Evaluation $PT Eval Low Complexity: 1 Low        Kerwin Augustus E, PT Acute Rehabilitation Services Pager [phone]  Office [phone]  Nusrat Encarnacion D Rowene Suto 07/16/2019, 2:14 PM

## 2019-07-16 NOTE — Evaluation (Signed)
Occupational Therapy Evaluation Patient Details Name: Alexis Nichols MRN: [id number] DOB: December 20, 1945 Today's Date: 07/16/2019    History of Present Illness 73 yo female s/p ORIF tib / fib plateau s/p fall on dog bed. PMH: acoustic neuroma removal, osteoporosis, HTN, CKD, impaired balance   Clinical Impression   Patient is s/p ORIF tib/ fib plateaur  surgery resulting in functional limitations due to the deficits listed below (see OT problem list). Pt requires total +2 Mod (A) to transfer to the R with stand pivot RW. Pt able to hop once during session. Pt positive attitude and eager to progress.  Patient will benefit from skilled OT acutely to increase independence and safety with ADLS to allow discharge CIR.     Follow Up Recommendations  CIR    Equipment Recommendations  None recommended by OT    Recommendations for Other Services Rehab consult     Precautions / Restrictions Precautions Precautions: Fall Precaution Comments: hinge brace on LLE with all transfers Restrictions Weight Bearing Restrictions: Yes LLE Weight Bearing: Non weight bearing      Mobility Bed Mobility Overal bed mobility: Needs Assistance Bed Mobility: Supine to Sit     Supine to sit: Min assist;HOB elevated     General bed mobility comments: pt requires mod cues for seqence and min (A) to help advance L LE. pt progressed to eob sitting with min guard (A)   Transfers Overall transfer level: Needs assistance Equipment used: Rolling walker (2 wheeled) Transfers: Sit to/from Stand Sit to Stand: +2 physical assistance;Mod assist;From elevated surface         General transfer comment: cues for hand placement. Pt with (A) to fully elevated off bed surface and maintain weight bearing precuations    Balance Overall balance assessment: Needs assistance Sitting-balance support: Bilateral upper extremity supported;Feet supported Sitting balance-Leahy Scale: Good     Standing balance support:  Bilateral upper extremity supported;During functional activity Standing balance-Leahy Scale: Poor Standing balance comment: relies on RW                           ADL either performed or assessed with clinical judgement   ADL Overall ADL's : Needs assistance/impaired Eating/Feeding: Independent   Grooming: Wash/dry hands;Wash/dry face;Sitting;Modified independent   Upper Body Bathing: Supervision/ safety;Sitting   Lower Body Bathing: Maximal assistance;Sit to/from stand   Upper Body Dressing : Supervision/safety;Sitting   Lower Body Dressing: Maximal assistance;Sit to/from stand   Toilet Transfer: +2 for physical assistance;Moderate assistance;BSC;RW;Stand-pivot   Toileting- Clothing Manipulation and Hygiene: Minimal assistance;Sitting/lateral lean         General ADL Comments: pt educated on transfer to 3n1 with mod cues for safety and hand placement on rw     Vision Baseline Vision/History: Wears glasses Wears Glasses: At all times Additional Comments: L eye reports worse vision since surgery - reports its like looking through a cloud     Perception     Praxis      Pertinent Vitals/Pain Pain Assessment: No/denies pain     Hand Dominance Right   Extremity/Trunk Assessment Upper Extremity Assessment Upper Extremity Assessment: Overall WFL for tasks assessed   Lower Extremity Assessment Lower Extremity Assessment: Defer to PT evaluation   Cervical / Trunk Assessment Cervical / Trunk Assessment: Normal   Communication Communication Communication: HOH   Cognition Arousal/Alertness: Awake/alert Behavior During Therapy: WFL for tasks assessed/performed Overall Cognitive Status: Within Functional Limits for tasks assessed  General Comments: states "i like to cuss" - this is normal conversational use of words    General Comments  bruising noted on L thigh area. hinge brace in good position and on  paoteitn    Exercises     Shoulder Instructions      Home Living Family/patient expects to be discharged to:: Private residence Living Arrangements: Alone Available Help at Discharge: Other (Comment);Family(CIR requested by RN ) Type of Home: Mobile home Home Access: Stairs to enter Entrance Stairs-Number of Steps: 6 Entrance Stairs-Rails: Right;Left Home Layout: One level     Bathroom Shower/Tub: Occupational psychologist: Standard     Home Equipment: Clinical cytogeneticist - 2 wheels;Cane - single point;Wheelchair - manual;Bedside commode   Additional Comments: Juliann Pulse is the name of the  dog (mixed lab )and Prissy is  the cat       Prior Functioning/Environment Level of Independence: Independent        Comments: daughter has been driving since May         OT Problem List: Decreased strength;Decreased activity tolerance;Impaired balance (sitting and/or standing);Decreased safety awareness;Decreased knowledge of use of DME or AE;Decreased knowledge of precautions;Pain      OT Treatment/Interventions: Self-care/ADL training;Therapeutic exercise;Energy conservation;DME and/or AE instruction;Therapeutic activities;Patient/family education;Balance training    OT Goals(Current goals can be found in the care plan section) Acute Rehab OT Goals Patient Stated Goal: i really like to get therapy here and cut out the middle man OT Goal Formulation: With patient Time For Goal Achievement: 07/30/19 Potential to Achieve Goals: Good  OT Frequency: Min 2X/week   Barriers to D/C: Decreased caregiver support          Co-evaluation PT/OT/SLP Co-Evaluation/Treatment: Yes Reason for Co-Treatment: Complexity of the patient's impairments (multi-system involvement);Necessary to address cognition/behavior during functional activity;For patient/therapist safety;To address functional/ADL transfers   OT goals addressed during session: ADL's and self-care;Proper use of Adaptive  equipment and DME      AM-PAC OT "6 Clicks" Daily Activity     Outcome Measure Help from another person eating meals?: None Help from another person taking care of personal grooming?: A Little Help from another person toileting, which includes using toliet, bedpan, or urinal?: A Lot Help from another person bathing (including washing, rinsing, drying)?: A Lot Help from another person to put on and taking off regular upper body clothing?: A Little Help from another person to put on and taking off regular lower body clothing?: A Lot 6 Click Score: 16   End of Session Equipment Utilized During Treatment: Gait belt;Rolling walker Nurse Communication: Mobility status;Precautions;Weight bearing status  Activity Tolerance: Patient tolerated treatment well Patient left: in chair;with call bell/phone within reach;with chair alarm set  OT Visit Diagnosis: Unsteadiness on feet (R26.81);Muscle weakness (generalized) (M62.81)                Time: [id number] OT Time Calculation (min): 24 min Charges:  OT General Charges $OT Visit: 1 Visit OT Evaluation $OT Eval Moderate Complexity: 1 Mod   Brynn, OTR/L  Acute Rehabilitation Services Pager: [phone] Office: [phone] .   Jeri Modena 07/16/2019, 1:41 PM

## 2019-07-17 LAB — RENAL FUNCTION PANEL
Albumin: 2.5 g/dL — ABNORMAL LOW (ref 3.5–5.0)
Anion gap: 6 (ref 5–15)
BUN: 17 mg/dL (ref 8–23)
CO2: 30 mmol/L (ref 22–32)
Calcium: 8.9 mg/dL (ref 8.9–10.3)
Chloride: 102 mmol/L (ref 98–111)
Creatinine, Ser: 1.06 mg/dL — ABNORMAL HIGH (ref 0.44–1.00)
GFR calc Af Amer: 60 mL/min — ABNORMAL LOW (ref >60)
GFR calc non Af Amer: 52 mL/min — ABNORMAL LOW (ref >60)
Glucose, Bld: 123 mg/dL — ABNORMAL HIGH (ref 70–99)
Phosphorus: 1.2 mg/dL — ABNORMAL LOW (ref 2.5–4.6)
Potassium: 4.4 mmol/L (ref 3.5–5.1)
Sodium: 138 mmol/L (ref 135–145)

## 2019-07-17 LAB — CBC
HCT: 33 % — ABNORMAL LOW (ref 36.0–46.0)
Hemoglobin: 10.8 g/dL — ABNORMAL LOW (ref 12.0–15.0)
MCH: 28.9 pg (ref 26.0–34.0)
MCHC: 32.7 g/dL (ref 30.0–36.0)
MCV: 88.2 fL (ref 80.0–100.0)
Platelets: 241 10*3/uL (ref 150–400)
RBC: 3.74 MIL/uL — ABNORMAL LOW (ref 3.87–5.11)
RDW: 16.5 % — ABNORMAL HIGH (ref 11.5–15.5)
WBC: 11.5 10*3/uL — ABNORMAL HIGH (ref 4.0–10.5)
nRBC: 0 % (ref 0.0–0.2)

## 2019-07-17 LAB — PREALBUMIN: Prealbumin: 17.4 mg/dL — ABNORMAL LOW (ref 18–38)

## 2019-07-17 MED ORDER — K PHOS MONO-SOD PHOS DI & MONO 155-852-130 MG PO TABS
250.0000 mg | ORAL_TABLET | Freq: Three times a day (TID) | ORAL | Status: DC
  Administered 2019-07-17 – 2019-07-20 (×10): 250 mg via ORAL
  Filled 2019-07-17 (×11): qty 1

## 2019-07-17 NOTE — Progress Notes (Signed)
Orthopaedic Trauma Service Progress Note  Patient ID: Alexis Nichols MRN: [id number] DOB/AGE: 18-Aug-1945 73 y.o.  Subjective:  Doing well Pain controlled No specific complaints Really hoping to go to CIR   Review of Systems  Constitutional: Negative for chills and fever.  Respiratory: Negative for shortness of breath and wheezing.   Cardiovascular: Negative for chest pain and palpitations.  Gastrointestinal: Negative for abdominal pain, nausea and vomiting.  Neurological: Negative for tingling and sensory change.    Objective:   VITALS:   Vitals:   07/16/19 1408 07/16/19 1943 07/17/19 0440 07/17/19 0715  BP: (!) 127/45 100/88 112/77 (!) 109/51  Pulse: 89 86 82 91  Resp: 15 15 16 14   Temp: 98.1 F (36.7 C) 99.1 F (37.3 C) 99.1 F (37.3 C) 98.9 F (37.2 C)  TempSrc: Oral Oral Oral Oral  SpO2: 96% 97% 95% 92%  Weight:      Height:        Estimated body mass index is 21.4 kg/m as calculated from the following:   Height as of this encounter: 5\' 2"  (1.575 m).   Weight as of this encounter: 53.1 kg.   Intake/Output      12/30 0701 - 12/31 0700 12/31 0701 - 01/01 0700   P.O. 480    I.V. (mL/kg) 300 (5.6)    IV Piggyback 0    Total Intake(mL/kg) 780 (14.7)    Urine (mL/kg/hr) 1000 (0.8)    Blood     Total Output 1000    Net -220         Urine Occurrence 5 x    Stool Occurrence 1 x      LABS  Results for orders placed or performed during the hospital encounter of 07/11/19 (from the past 24 hour(s))  CBC     Status: Abnormal   Collection Time: 07/17/19  2:33 AM  Result Value Ref Range   WBC 11.5 (H) 4.0 - 10.5 K/uL   RBC 3.74 (L) 3.87 - 5.11 MIL/uL   Hemoglobin 10.8 (L) 12.0 - 15.0 g/dL   HCT 33.0 (L) 36.0 - 46.0 %   MCV 88.2 80.0 - 100.0 fL   MCH 28.9 26.0 - 34.0 pg   MCHC 32.7 30.0 - 36.0 g/dL   RDW 16.5 (H) 11.5 - 15.5 %   Platelets 241 150 - 400 K/uL   nRBC 0.0 0.0 - 0.2 %   Prealbumin     Status: Abnormal   Collection Time: 07/17/19  2:33 AM  Result Value Ref Range   Prealbumin 17.4 (L) 18 - 38 mg/dL  Renal function panel     Status: Abnormal   Collection Time: 07/17/19  2:33 AM  Result Value Ref Range   Sodium 138 135 - 145 mmol/L   Potassium 4.4 3.5 - 5.1 mmol/L   Chloride 102 98 - 111 mmol/L   CO2 30 22 - 32 mmol/L   Glucose, Bld 123 (H) 70 - 99 mg/dL   BUN 17 8 - 23 mg/dL   Creatinine, Ser 1.06 (H) 0.44 - 1.00 mg/dL   Calcium 8.9 8.9 - 10.3 mg/dL   Phosphorus 1.2 (L) 2.5 - 4.6 mg/dL   Albumin 2.5 (L) 3.5 - 5.0 g/dL   GFR calc non Af Amer 52 (L) >60 mL/min   GFR calc Af Amer 60 (  L) >60 mL/min   Anion gap 6 5 - 15     PHYSICAL EXAM:   Gen: resting comfortably in bed, NAD, appears well Lungs: unlabored Cardiac: regular  Abd: +BS Ext:       Left Lower Extremity              Dressing c/d/i    Dressings removed   Incisions are stable   Mild erythema to soft tissue related to trauma    Swelling improving    No active drainage    pinsites from ex fix are stable              Hinged brace fitting well, unlocked             Ext warm              + DP pulse             No pain with passive stretching of lower leg compartments             EHL, FHL, lesser toe motor intact             Ankle flex, ext, inversion and eversion are intact             No DCT              Compartments are soft   Tolerates passive knee motion from full extension to about 35-40 degrees of flexion   Assessment/Plan: 2 Days Post-Op   Principal Problem:   Tibial plateau fracture, left Active Problems:   Osteoporosis   HTN (hypertension)   GERD (gastroesophageal reflux disease)   CKD (chronic kidney disease)   Fracture of tibial shaft, left, closed   Acute blood loss anemia   Vitamin D insufficiency   Acoustic neuroma (HCC)   Impaired functional mobility, balance, gait, and endurance   Anti-infectives (From admission, onward)   Start     Dose/Rate Route  Frequency Ordered Stop   07/15/19 2000  ceFAZolin (ANCEF) IVPB 1 g/50 mL premix     1 g 100 mL/hr over 30 Minutes Intravenous Every 12 hours 07/15/19 1257 07/16/19 1044   07/15/19 0800  ceFAZolin (ANCEF) IVPB 2g/100 mL premix     2 g 200 mL/hr over 30 Minutes Intravenous On call to O.R. 07/14/19 1022 07/15/19 0830   07/12/19 1700  ceFAZolin (ANCEF) IVPB 2g/100 mL premix     2 g 200 mL/hr over 30 Minutes Intravenous Every 6 hours 07/12/19 1300 07/12/19 2331   07/12/19 0600  ceFAZolin (ANCEF) IVPB 2g/100 mL premix     2 g 200 mL/hr over 30 Minutes Intravenous On call to O.R. 07/12/19 0147 07/12/19 1125    .  POD/HD#: 20 73 year old female ground-level fall with complex left bicondylar tibial plateau fracture with shaft extension   -Ground-level fall   -Complex left bicondylar tibial plateau fracture with shaft extension and severe depression of medial plateau             S/p ORIF               NWB x 8 weeks             Unrestricted knee ROM                          Hinged brace can be off for ROM  Hinged brace on when mobilizing             PT/OT evals             Dressing changed today    Change dressings as needed   Ok to shower and clean leg with soap and water only              Ice and elevate   Ted hose                            PT- please teach HEP for L knee ROM- AROM, PROM. Prone exercises as well. No ROM restrictions.  Quad sets, SLR, LAQ, SAQ, heel slides, stretching, prone flexion and extension ok                            Ankle theraband program, heel cord stretching, toe towel curls, etc               No pillows under bend of knee when at rest, ok to place under heel to help work on extension. Can also use zero knee bone foam if available   - gait disturbance/balance issues/impaired mobility              Continued issue since excision of acoustic neuroma              Increased fall risk with subsequent risk for secondary fracture              Think she needs aggressive therapy to maximize her recovery beyond what she would get at SNF                 - Pain management:             Continue with current pain management                         Scheduled tylenol                          PRN norco    - ABL anemia/Hemodynamics             stable             Cbc in am              Renal function improving     - Medical issues              Home medications               Vitamin d insufficiency                          Supplement               Hypophosphatemia                          May be related to vitamin d insufficiency if her vitamin d insufficiency is causing hyperparathyroidism                         Likely exacerbated due to chronic CKD  Check PTH    Will also dc Maalox and IVF    PO supplementation 250 mg po TID   Check phos levels daily x 2                             - DVT/PE prophylaxis:             Lovenox             scds             motilize   - ID:              Perioperative antibiotics completed    - Metabolic Bone Disease:             Osteoporosis and vitamin D insufficiency                         Patient's last bone density scan was in 2014.  There was one ordered for November 2019 which does not appear to have been done.  Would definitely recommend bone density scan in the next 4 to 8 weeks.  Her fracture does appear to be suggestive of worsening osteoporosis.  She would likely benefit from pharmacologic treatment of her osteoporosis.  Agent selection would be dependent on the degree of change between her scan from 2014 to the most recent one               vitamin D and vitamin C supplementation             Calcium levels look appropriate.  Continue with dietary acquisition of calcium through whole foods               Magnesium levels look good             Follow up on PTH due do hypophosphatemia                Renal disease also a factor here    - Activity:              Nonweightbearing left leg             Up with assistance   - FEN/GI prophylaxis/Foley/Lines:             Regular diet             NSL IVF    - Impediments to fracture healing:             Osteoporosis              Vitamin d insufficiency              Low energy fracture   - Dispo:             therapy evals             CIR eval              SW eval for SNF In the event that pt is not a CIR candidate   Ortho issues stable, await for CIR eval   Jari Pigg, PA-C [phone] (C) 07/17/2019, 9:47 AM  [facility] [place] [phone] Jenetta [phone] (F)   After 6pm on weekdays please call office number to get in touch with on call provider or refer to [place] and look to see who is on call for the Sports  Medicine Call Group which is listed under orthopaedics   On Weekends please call office number to get in touch with on call provider or refer to [place] and look to see who is on call for the Sports Medicine Call Group which is listed under orthopaedics

## 2019-07-17 NOTE — Plan of Care (Signed)

## 2019-07-17 NOTE — Progress Notes (Signed)
Inpatient Rehab Admissions:  Inpatient Rehab Consult received.  I met with patient at the bedside for rehabilitation assessment and to discuss goals and expectations of an inpatient rehab admission.  She is very hopeful for CIR.  I did let her know that I would need to seek insurance authorization for CIR admission, and that I would not be able to do this until Monday, as insurance companies are closed for the holidays.  I also let her know that she may not meet criteria for medical necessity for an inpt rehab admit.  I will f/u with pt Monday and open insurance at that time.    Signed: Shann Medal, PT, DPT Admissions Coordinator [phone] 07/17/19  4:29 PM

## 2019-07-17 NOTE — Progress Notes (Signed)
Physical Therapy Treatment Patient Details Name: Alexis Nichols MRN: [id number] DOB: [birth year] Today's Date: 07/17/2019    History of Present Illness 73 yo female s/p ORIF tib / fib plateau s/p fall on dog bed. PMH: acoustic neuroma removal 11/2018, osteoporosis, HTN, CKD, impaired balance    PT Comments    Pt very pleasant and willing to mobilize. Pt declined premedication prior to session then during session requested medication and RN notified with pt able to increase transfers and gait this session. Pt unable to complete further HEP end of session due to RN arrival for medication and pt choking on pills with further HEP deferred. PT educated for ROM, HEP, transfers and gait with brace repositioned on leg as it slid down during gait. Pt remains excellent CIR candidate and states she tries to stay active walking her long driveway.     Follow Up Recommendations  CIR     Equipment Recommendations  Rolling walker with 5" wheels    Recommendations for Other Services       Precautions / Restrictions Precautions Precautions: Fall Precaution Comments: unlocked hinged brace LLE, AROM and PROM ok Restrictions LLE Weight Bearing: Non weight bearing    Mobility  Bed Mobility Overal bed mobility: Needs Assistance Bed Mobility: Supine to Sit     Supine to sit: Min assist;HOB elevated     General bed mobility comments: HOB 30 degrees with min assist to clear LLE toward right side of bed  Transfers Overall transfer level: Needs assistance   Transfers: Sit to/from Stand Sit to Stand: Min assist         General transfer comment: min assist to stand from bed with cues for hand placement, minguard to lower to recliner  Ambulation/Gait Ambulation/Gait assistance: Min assist;+2 safety/equipment Gait Distance (Feet): 10 Feet Assistive device: Rolling walker (2 wheeled) Gait Pattern/deviations: Step-to pattern   Gait velocity interpretation: <1.8 ft/sec, indicate of risk for  recurrent falls General Gait Details: pt with excellent ability to maintain NWB status on LLE and limited by fatigue and pain with cues for sequence and close chair follow   Stairs             Wheelchair Mobility    Modified Rankin (Stroke Patients Only)       Balance Overall balance assessment: History of Falls Sitting-balance support: No upper extremity supported;Feet supported Sitting balance-Leahy Scale: Good     Standing balance support: Bilateral upper extremity supported;During functional activity Standing balance-Leahy Scale: Poor Standing balance comment: reliant on UE support to maintain NwB LLE                            Cognition Arousal/Alertness: Awake/alert Behavior During Therapy: WFL for tasks assessed/performed Overall Cognitive Status: Within Functional Limits for tasks assessed                                        Exercises General Exercises - Lower Extremity Heel Slides: AAROM;Left;10 reps;Seated Hip ABduction/ADduction: AAROM;Left;Seated;10 reps    General Comments        Pertinent Vitals/Pain Pain Assessment: 0-10 Pain Score: 6  Pain Location: LLE Pain Descriptors / Indicators: Aching;Guarding Pain Intervention(s): Limited activity within patient's tolerance;Monitored during session;Patient requesting pain meds-RN notified;Repositioned    Home Living  Prior Function            PT Goals (current goals can now be found in the care plan section) Progress towards PT goals: Progressing toward goals    Frequency    Min 4X/week      PT Plan Current plan remains appropriate    Co-evaluation              AM-PAC PT "6 Clicks" Mobility   Outcome Measure  Help needed turning from your back to your side while in a flat bed without using bedrails?: A Little Help needed moving from lying on your back to sitting on the side of a flat bed without using bedrails?: A  Little Help needed moving to and from a bed to a chair (including a wheelchair)?: A Little Help needed standing up from a chair using your arms (e.g., wheelchair or bedside chair)?: A Little Help needed to walk in hospital room?: A Little Help needed climbing 3-5 steps with a railing? : Total 6 Click Score: 16    End of Session Equipment Utilized During Treatment: Gait belt;Other (comment)(knee brace) Activity Tolerance: Patient tolerated treatment well;Patient limited by fatigue Patient left: in chair;with call bell/phone within reach;with chair alarm set;with nursing/sitter in room Nurse Communication: Mobility status;Precautions;Weight bearing status PT Visit Diagnosis: History of falling (Z91.81);Unsteadiness on feet (R26.81);Difficulty in walking, not elsewhere classified (R26.2)     Time: [id number] PT Time Calculation (min) (ACUTE ONLY): 25 min  Charges:  $Gait Training: 8-22 mins $Therapeutic Activity: 8-22 mins                     Wonda Goodgame P, PT Acute Rehabilitation Services Pager: [phone] Office: [place] 07/17/2019, 1:45 PM

## 2019-07-17 NOTE — Plan of Care (Signed)

## 2019-07-18 LAB — CBC
HCT: 33.5 % — ABNORMAL LOW (ref 36.0–46.0)
Hemoglobin: 10.5 g/dL — ABNORMAL LOW (ref 12.0–15.0)
MCH: 28.8 pg (ref 26.0–34.0)
MCHC: 31.3 g/dL (ref 30.0–36.0)
MCV: 92 fL (ref 80.0–100.0)
Platelets: 273 10*3/uL (ref 150–400)
RBC: 3.64 MIL/uL — ABNORMAL LOW (ref 3.87–5.11)
RDW: 17.2 % — ABNORMAL HIGH (ref 11.5–15.5)
WBC: 11.8 10*3/uL — ABNORMAL HIGH (ref 4.0–10.5)
nRBC: 0 % (ref 0.0–0.2)

## 2019-07-18 LAB — PHOSPHORUS: Phosphorus: 2.1 mg/dL — ABNORMAL LOW (ref 2.5–4.6)

## 2019-07-18 LAB — CALCIUM, IONIZED: Calcium, Ionized, Serum: 5.3 mg/dL (ref 4.5–5.6)

## 2019-07-18 MED ORDER — ENOXAPARIN SODIUM 40 MG/0.4ML ~~LOC~~ SOLN
40.0000 mg | SUBCUTANEOUS | Status: DC
  Administered 2019-07-19 – 2019-07-24 (×6): 40 mg via SUBCUTANEOUS
  Filled 2019-07-18 (×6): qty 0.4

## 2019-07-18 NOTE — Progress Notes (Signed)
Orthopaedic Trauma Progress Note  S: Doing well this morning, pain is well controlled.  Feels that she is improving day by day.  No specific concerns or complaints today.  Is hopeful that she will be able to go to inpatient rehab here at the hospital.  O:  Vitals:   07/18/19 0328 07/18/19 0741  BP: (!) 110/47 (!) 114/50  Pulse: 86 69  Resp:  17  Temp: 98.4 F (36.9 C) 99.1 F (37.3 C)  SpO2: 93% 94%    General - Sitting up in bed, no acute distress.  About the breakfast Cardiac -  Heart regular rate and rhythm Respiratory -  No increased work of breathing. Lungs CTA anterior lung fields bilaterally Left lower extremity - Hinged knee brace in place, unlocked.  Dressings are clean dry and intact.  Mild swelling about the knee and proximal tibia.  Tender with palpation about the knee.  Tolerates this small amount of knee flexion.  Ankle dorsiflexion and plantarflexion is intact.+ EHL.+ FHL.  Foot is warm and well-perfused.  Compartments are soft and compressible.  2+ DP pulse  Imaging: Stable post op imaging.   Labs:  Results for orders placed or performed during the hospital encounter of 07/11/19 (from the past 24 hour(s))  Phosphorus     Status: Abnormal   Collection Time: 07/18/19  2:28 AM  Result Value Ref Range   Phosphorus 2.1 (L) 2.5 - 4.6 mg/dL  CBC     Status: Abnormal   Collection Time: 07/18/19  2:28 AM  Result Value Ref Range   WBC 11.8 (H) 4.0 - 10.5 K/uL   RBC 3.64 (L) 3.87 - 5.11 MIL/uL   Hemoglobin 10.5 (L) 12.0 - 15.0 g/dL   HCT 33.5 (L) 36.0 - 46.0 %   MCV 92.0 80.0 - 100.0 fL   MCH 28.8 26.0 - 34.0 pg   MCHC 31.3 30.0 - 36.0 g/dL   RDW 17.2 (H) 11.5 - 15.5 %   Platelets 273 150 - 400 K/uL   nRBC 0.0 0.0 - 0.2 %    Assessment: 74 year old female status post ground-level fall, postop day #3  Injuries: Left bicondylar tibial plateau fracture with shaft extension s/p ORIF  Weightbearing: NWB LLE x8 weeks  Insicional and dressing care: Continue dressing  changes as needed      Showering:   Ok to shower and clean leg with soap and water only  Orthopedic device(s): Hinged knee brace.  Unrestricted range of motion   - Hinged brace can be off for ROM   - Hinged brace on when mobilizing  CV/Blood loss: Acute blood loss anemia, Hgb 10.5 this morning. Hemodynamically stable  Pain management: Continue current regimen.  As needed Norco, scheduled Tylenol  VTE prophylaxis: Lovenox  ID: Postoperative antibiotics completed  Foley/Lines:  No foley, KVO IVFs  Impediments to Fracture Healing: Osteoporosis.  Vitamin D level 27, continue vitamin D3 supplementation  Dispo: Up with therapy as tolerated.  Awaiting insurance approval for CIR.  MSW evaluation for SNF in the event that patient is not a CIR candidate.  Ortho issues are stable, she is okay for discharge to the next venue.  Follow - up plan: 2 weeks from hospital discharge with Dr. Jeoffrey Massed A. [facility] [phone] (office) [email or web address]

## 2019-07-18 NOTE — Plan of Care (Signed)

## 2019-07-19 LAB — GLUCOSE, CAPILLARY
Glucose-Capillary: 152 mg/dL — ABNORMAL HIGH (ref 70–99)
Glucose-Capillary: 122 mg/dL — ABNORMAL HIGH (ref 70–99)

## 2019-07-19 LAB — CREATININE, SERUM
Creatinine, Ser: 0.94 mg/dL (ref 0.44–1.00)
GFR calc Af Amer: >60 mL/min (ref >60)
GFR calc non Af Amer: 60 mL/min — ABNORMAL LOW (ref >60)

## 2019-07-19 LAB — PHOSPHORUS: Phosphorus: 3.9 mg/dL (ref 2.5–4.6)

## 2019-07-19 LAB — PTH, INTACT AND CALCIUM
Calcium, Total (PTH): 8.8 mg/dL (ref 8.7–10.3)
PTH: 48 pg/mL (ref 15–65)

## 2019-07-19 NOTE — Progress Notes (Signed)
Subjective: 4 Days Post-Op Procedure(s) (LRB): OPEN REDUCTION INTERNAL FIXATION (ORIF) TIBIA FRACTURE (Left) OPEN REDUCTION INTERNAL FIXATION (ORIF) TIBIAL PLATEAU (Left) Removal External Fixation Leg (Left) Patient reports pain as mild.  Still awaiting eval I believe for CIR and was told not likely to hear anything about insurance approval until Monday.    Objective: Vital signs in last 24 hours: Temp:  [98.7 F (37.1 C)-98.9 F (37.2 C)] 98.7 F (37.1 C) (01/02 0923) Pulse Rate:  [58-81] 81 (01/02 0923) Resp:  [16-19] 18 (01/02 0923) BP: (94-107)/(39-65) 103/65 (01/02 0923) SpO2:  [90 %-97 %] 95 % (01/02 0923)  Intake/Output from previous day: 01/01 0701 - 01/02 0700 In: 840 [P.O.:840] Out: 1000 [Urine:1000] Intake/Output this shift: Total I/O In: -  Out: 800 [Urine:800]  Recent Labs    07/17/19 0233 07/18/19 0228  HGB 10.8* 10.5*   Recent Labs    07/17/19 0233 07/18/19 0228  WBC 11.5* 11.8*  RBC 3.74* 3.64*  HCT 33.0* 33.5*  PLT 241 273   Recent Labs    07/17/19 0233 07/19/19 0534  NA 138  --   K 4.4  --   CL 102  --   CO2 30  --   BUN 17  --   CREATININE 1.06* 0.94  GLUCOSE 123*  --   CALCIUM 8.9  8.8  --    No results for input(s): LABPT, INR in the last 72 hours.  Neurovascular intact Sensation intact distally Intact pulses distally Dorsiflexion/Plantar flexion intact Incision: dressing C/D/I   Assessment/Plan: 4 Days Post-Op Procedure(s) (LRB): OPEN REDUCTION INTERNAL FIXATION (ORIF) TIBIA FRACTURE (Left) OPEN REDUCTION INTERNAL FIXATION (ORIF) TIBIAL PLATEAU (Left) Removal External Fixation Leg (Left)  Injuries: Left bicondylar tibial plateau fracture with shaft extension s/p ORIF                  Weightbearing: NWB LLE x8 weeks                  Insicional and dressing care: Continue dressing changes as needed                 Showering: Ok to shower and clean leg with soap and water only                  Orthopedic device(s): Hinged  knee brace.  Unrestricted range of motion                         - Hinged brace can be off for ROM   - Hinged brace on when mobilizing  CV/Blood loss: Acute blood loss anemia, Hgb 10.5. Hemodynamically stable  Pain management: Continue current regimen.  As needed Norco, scheduled Tylenol  VTE prophylaxis: Lovenox  ID: Postoperative antibiotics completed  Foley/Lines:  No foley, KVO IVFs  Impediments to Fracture Healing: Osteoporosis.  Vitamin D level 27, continue vitamin D3 supplementation  Dispo: Up with therapy as tolerated.  Awaiting insurance approval for CIR.  MSW evaluation for SNF in the event that patient is not a CIR candidate.  Ortho issues are stable, she is okay for discharge to the next venue.  Follow - up plan: 2 weeks from hospital discharge with Dr. George Ina Caison Hearn 07/19/2019, 11:43 AM

## 2019-07-19 NOTE — Plan of Care (Signed)
  Problem: Education: Goal: Knowledge of General Education information will improve Description: Including pain rating scale, medication(s)/side effects and non-pharmacologic comfort measures Outcome: Progressing   Problem: Safety: Goal: Ability to remain free from injury will improve Outcome: Progressing   

## 2019-07-19 NOTE — Plan of Care (Signed)

## 2019-07-20 NOTE — Progress Notes (Signed)
Subjective: 5 Days Post-Op Procedure(s) (LRB): OPEN REDUCTION INTERNAL FIXATION (ORIF) TIBIA FRACTURE (Left) OPEN REDUCTION INTERNAL FIXATION (ORIF) TIBIAL PLATEAU (Left) Removal External Fixation Leg (Left) Patient reports pain as mild.    Objective: Vital signs in last 24 hours: Temp:  [98.1 F (36.7 C)-98.8 F (37.1 C)] 98.1 F (36.7 C) (01/03 0737) Pulse Rate:  [53-88] 55 (01/03 0737) Resp:  [16-18] 16 (01/03 0737) BP: (101-110)/(42-70) 104/56 (01/03 0737) SpO2:  [91 %-99 %] 95 % (01/03 0737)  Intake/Output from previous day: 01/02 0701 - 01/03 0700 In: 240 [P.O.:240] Out: 1350 [Urine:1350] Intake/Output this shift: No intake/output data recorded.  Recent Labs    07/18/19 0228  HGB 10.5*   Recent Labs    07/18/19 0228  WBC 11.8*  RBC 3.64*  HCT 33.5*  PLT 273   Recent Labs    07/19/19 0534  CREATININE 0.94   No results for input(s): LABPT, INR in the last 72 hours.  Dorsiflexion/Plantar flexion intact Incision: dressing C/D/I   Assessment/Plan: 5 Days Post-Op Procedure(s) (LRB): OPEN REDUCTION INTERNAL FIXATION (ORIF) TIBIA FRACTURE (Left) OPEN REDUCTION INTERNAL FIXATION (ORIF) TIBIAL PLATEAU (Left) Removal External Fixation Leg (Left)  Injuries:Left bicondylar tibial plateau fracture with shaft extensions/pORIF Weightbearing: [id number] weeks Insicional and dressing care:Continue dressing changes as needed Showering:Ok to shower and clean leg with soap and water only Orthopedic device(s):Hinged knee brace.Unrestricted range of motion -Hinged brace can be off for ROM   -Hinged brace on when mobilizing  CV/Blood loss: Acute blood loss anemia, Hgb10.5. Hemodynamically stable  Pain management:Continue current regimen. As needed Norco, scheduled Tylenol  VTE prophylaxis:Lovenox  [id number] antibiotics  completed  Foley/Lines: No foley, KVO IVFs  Impediments to Fracture Healing:Osteoporosis. Vitamin D level 27, continue vitamin D3 supplementation  Dispo:Up with therapy as tolerated. Awaiting insurance approval for CIR. MSW evaluation for SNF in the event that patient is not a CIR candidate. Ortho issues are stable, she is okay for discharge to the next venue.  Follow - up plan:2 weeksfrom hospital discharge with Dr. George Ina Selenia Mihok 07/20/2019, 10:58 AM

## 2019-07-20 NOTE — Plan of Care (Signed)

## 2019-07-20 NOTE — Plan of Care (Signed)
  Problem: Education: Goal: Knowledge of General Education information will improve Description: Including pain rating scale, medication(s)/side effects and non-pharmacologic comfort measures Outcome: Progressing   Problem: Pain Managment: Goal: General experience of comfort will improve Outcome: Progressing   Problem: Safety: Goal: Ability to remain free from injury will improve Outcome: Progressing   

## 2019-07-21 LAB — PHOSPHORUS: Phosphorus: 2.9 mg/dL (ref 2.5–4.6)

## 2019-07-21 MED ORDER — PHENOL 1.4 % MT LIQD: 1.0000 | OROMUCOSAL | Status: DC | PRN

## 2019-07-21 MED ORDER — HYDROCORTISONE 1 % EX CREA
1.0000 "application " | TOPICAL_CREAM | Freq: Three times a day (TID) | CUTANEOUS | Status: DC | PRN
  Administered 2019-07-21 – 2019-07-22 (×2): 1 via TOPICAL
  Filled 2019-07-21: qty 28

## 2019-07-21 MED ORDER — AQUAPHOR EX OINT
TOPICAL_OINTMENT | Freq: Two times a day (BID) | CUTANEOUS | Status: DC | PRN
  Filled 2019-07-21: qty 50

## 2019-07-21 MED ORDER — LIP MEDEX EX OINT
1.0000 "application " | TOPICAL_OINTMENT | CUTANEOUS | Status: DC | PRN
  Administered 2019-07-21: 1 via TOPICAL
  Filled 2019-07-21: qty 7

## 2019-07-21 NOTE — Progress Notes (Signed)
Medicated with medicated cream to back.  Red raised rash noted

## 2019-07-21 NOTE — Progress Notes (Signed)
Inpatient Rehab Admissions Coordinator:   Insurance opened for prior authorization request.  Will follow.   Shann Medal, PT, DPT Admissions Coordinator [phone] 07/21/19  3:25 PM

## 2019-07-21 NOTE — Progress Notes (Signed)
Orthopaedic Trauma Service Progress Note  Patient ID: Alexis Nichols MRN: [id number] DOB/AGE: [birth year] 74 y.o.  Subjective:  No complaints Still hopeful for CIR  Discussed with CIR team.  Additional info submitted to insurance company today. Hopeful for decision tomorrow    ROS As above  Objective:   VITALS:   Vitals:   07/21/19 0250 07/21/19 0822 07/21/19 0915 07/21/19 1525  BP: (!) 119/50 (!) 118/42 (!) 118/42 (!) 123/52  Pulse: 73 69 69 (!) 55  Resp:  17  15  Temp: 98.5 F (36.9 C) 98.5 F (36.9 C)  97.9 F (36.6 C)  TempSrc: Oral Oral  Oral  SpO2: 95% 97%  100%  Weight:      Height:        Estimated body mass index is 21.4 kg/m as calculated from the following:   Height as of this encounter: 5\' 2"  (1.575 m).   Weight as of this encounter: 53.1 kg.   Intake/Output      01/03 0701 - 01/04 0700 01/04 0701 - 01/05 0700   P.O.  480   Total Intake(mL/kg)  480 (9)   Urine (mL/kg/hr) 500 (0.4)    Stool 0    Total Output 500    Net -500 +480        Urine Occurrence  4 x   Stool Occurrence 1 x 4 x     LABS  Results for orders placed or performed during the hospital encounter of 07/11/19 (from the past 24 hour(s))  Phosphorus     Status: None   Collection Time: 07/21/19  3:49 AM  Result Value Ref Range   Phosphorus 2.9 2.5 - 4.6 mg/dL     PHYSICAL EXAM:   Gen: resting comfortably in bedside chair Lungs: unlabored Cardiac: regular  Abd: +BS Ext:       Left Lower Extremity              Dressing c/d/i              Hinged brace fitting well, unlocked             Ext warm              + DP pulse             No pain with passive stretching of lower leg compartments             EHL, FHL, lesser toe motor intact             Ankle flex, ext, inversion and eversion are intact             No DCT              Compartments are soft     Assessment/Plan: 6 Days Post-Op    Principal Problem:   Tibial plateau fracture, left Active Problems:   Osteoporosis   HTN (hypertension)   GERD (gastroesophageal reflux disease)   CKD (chronic kidney disease)   Fracture of tibial shaft, left, closed   Acute blood loss anemia   Vitamin D insufficiency   Acoustic neuroma (HCC)   Impaired functional mobility, balance, gait, and endurance   Anti-infectives (From admission, onward)   Start     Dose/Rate Route Frequency Ordered Stop   07/15/19 2000  ceFAZolin (  ANCEF) IVPB 1 g/50 mL premix     1 g 100 mL/hr over 30 Minutes Intravenous Every 12 hours 07/15/19 1257 07/16/19 1044   07/15/19 0800  ceFAZolin (ANCEF) IVPB 2g/100 mL premix     2 g 200 mL/hr over 30 Minutes Intravenous On call to O.R. 07/14/19 1022 07/15/19 0830   07/12/19 1700  ceFAZolin (ANCEF) IVPB 2g/100 mL premix     2 g 200 mL/hr over 30 Minutes Intravenous Every 6 hours 07/12/19 1300 07/12/19 2331   07/12/19 0600  ceFAZolin (ANCEF) IVPB 2g/100 mL premix     2 g 200 mL/hr over 30 Minutes Intravenous On call to O.R. 07/12/19 0147 07/12/19 1125    .  POD/HD#: 17  74 year old female ground-level fall with complex left bicondylar tibial plateau fracture with shaft extension   -Ground-level fall   -Complex left bicondylar tibial plateau fracture with shaft extension and severe depression of medial plateau             S/p ORIF               NWB x 8 weeks             Unrestricted knee ROM                          Hinged brace can be off for ROM                          Hinged brace on when mobilizing             PT/OT evals             Dressing changes as needed                         Ok to shower and clean leg with soap and water only              Ice and elevate              Ted hose                            PT- please teach HEP for L knee ROM- AROM, PROM. Prone exercises as well. No ROM restrictions.  Quad sets, SLR, LAQ, SAQ, heel slides, stretching, prone flexion and extension ok                             Ankle theraband program, heel cord stretching, toe towel curls, etc               No pillows under bend of knee when at rest, ok to place under heel to help work on extension. Can also use zero knee bone foam if available   - gait disturbance/balance issues/impaired mobility              Continued issue since excision of acoustic neuroma              Increased fall risk with subsequent risk for secondary fracture             Think she needs aggressive therapy to maximize her recovery beyond what she would get at SNF                 - Pain management:  Continue with current pain management                         Scheduled tylenol                          PRN norco    - ABL anemia/Hemodynamics             stable   - Medical issues              Home medications               Vitamin d insufficiency                          Supplement                Hypophosphatemia- improved                          May be related to vitamin d insufficiency   Maalox use on initial admission likely contributed, this has been dc'd for several days                          Likely exacerbated due to chronic CKD                          PTH levels look good                         PO supplementation completed                                                      - DVT/PE prophylaxis:             Lovenox             scds             motilize   - ID:              Perioperative antibiotics completed    - Metabolic Bone Disease:             Osteoporosis and vitamin D insufficiency                         Patient's last bone density scan was in 2014.  There was one ordered for November 2019 which does not appear to have been done.  Would definitely recommend bone density scan in the next 4 to 8 weeks.  Her fracture does appear to be suggestive of worsening osteoporosis.  She would likely benefit from pharmacologic treatment of her osteoporosis.  Agent selection would be  dependent on the degree of change between her scan from 2014 to the most recent one               vitamin D and vitamin C supplementation             Calcium levels look appropriate.  Continue with dietary acquisition of calcium through whole foods               Magnesium levels look  good             PTH looks ok                 Renal disease also a factor here    - Activity:             Nonweightbearing left leg             Up with assistance   - FEN/GI prophylaxis/Foley/Lines:             Regular diet             NSL IVF     - Impediments to fracture healing:             Osteoporosis              Vitamin d insufficiency              Low energy fracture   - Dispo:             therapy evals             CIR eval              SW eval for SNF In the event that pt is not a CIR candidate              Ortho issues stable, await for CIR eval (insurance decision)      Jari Pigg, PA-C [phone] (C) 07/21/2019, 3:42 PM  [facility] [place] [address] [phone] Jenetta [phone] (F)   After 6pm on weekdays please call office number to get in touch with on call provider or refer to [place] and look to see who is on call for the Sports Medicine Call Group which is listed under orthopaedics   On Weekends please call office number to get in touch with on call provider or refer to [place] and look to see who is on call for the Sports Medicine Call Group which is listed under orthopaedics

## 2019-07-21 NOTE — Progress Notes (Signed)
Physical Therapy Treatment Patient Details Name: Alexis Nichols MRN: [id number] DOB: 1946/03/28 Today's Date: 07/21/2019    History of Present Illness 74 yo female s/p ORIF tib / fib plateau s/p fall on dog bed. PMH: acoustic neuroma removal 11/2018, osteoporosis, HTN, CKD, impaired balance    PT Comments    Pt very pleasant and eager to get up to [facility]. Pt able to perform pericare in sitting without assist. PT educated for stand pivot, HEP, progression and gait. Pt needs cues to maintain NWB status with sit<>stand. Improved gait , strength and ROM this session achieving grossly 45 degrees active knee flexion. Pt noted to have rash on back with RN aware.    Follow Up Recommendations  CIR;Supervision for mobility/OOB     Equipment Recommendations  Rolling walker with 5" wheels;3in1 (PT)    Recommendations for Other Services       Precautions / Restrictions Precautions Precautions: Fall Precaution Comments: unlocked hinged brace LLE, AROM and PROM ok Restrictions LLE Weight Bearing: Non weight bearing    Mobility  Bed Mobility Overal bed mobility: Modified Independent Bed Mobility: Supine to Sit           General bed mobility comments: pt able to transition to right side of bed without assist, HOB 20 degrees with rail pt with UE assist to move LLE  Transfers Overall transfer level: Needs assistance   Transfers: Sit to/from Stand;Stand Pivot Transfers Sit to Stand: Min guard Stand pivot transfers: Min assist       General transfer comment: cues for hand placement and monitoring NWB on LLE with transfers. pivot bed to [facility] with cues for sequence and min assist for stability. rise from bed and bSC  Ambulation/Gait Ambulation/Gait assistance: Min guard Gait Distance (Feet): 15 Feet Assistive device: Rolling walker (2 wheeled) Gait Pattern/deviations: Step-to pattern   Gait velocity interpretation: 1.31 - 2.62 ft/sec, indicative of limited community ambulator General  Gait Details: pt with excellent ability to maintain NWB status on LLE and limited by fatigue . increased gait with chair follow   Stairs             Wheelchair Mobility    Modified Rankin (Stroke Patients Only)       Balance Overall balance assessment: History of Falls Sitting-balance support: No upper extremity supported;Feet supported Sitting balance-Leahy Scale: Good     Standing balance support: Bilateral upper extremity supported;During functional activity   Standing balance comment: reliant on UE support to maintain NwB LLE                            Cognition Arousal/Alertness: Awake/alert Behavior During Therapy: WFL for tasks assessed/performed Overall Cognitive Status: Within Functional Limits for tasks assessed                                        Exercises General Exercises - Lower Extremity Long Arc Quad: AROM;Left;Seated;15 reps Hip ABduction/ADduction: AAROM;Left;Seated;15 reps Hip Flexion/Marching: AAROM;Left;Seated;15 reps    General Comments        Pertinent Vitals/Pain Pain Assessment: No/denies pain    Home Living                      Prior Function            PT Goals (current goals can now be found in the care plan  section) Progress towards PT goals: Progressing toward goals    Frequency    Min 4X/week      PT Plan Current plan remains appropriate    Co-evaluation              AM-PAC PT "6 Clicks" Mobility   Outcome Measure  Help needed turning from your back to your side while in a flat bed without using bedrails?: A Little Help needed moving from lying on your back to sitting on the side of a flat bed without using bedrails?: A Little Help needed moving to and from a bed to a chair (including a wheelchair)?: A Little Help needed standing up from a chair using your arms (e.g., wheelchair or bedside chair)?: A Little Help needed to walk in hospital room?: A Little Help  needed climbing 3-5 steps with a railing? : Total 6 Click Score: 16    End of Session Equipment Utilized During Treatment: Gait belt;Other (comment)(knee brace) Activity Tolerance: Patient tolerated treatment well;Patient limited by fatigue Patient left: in chair;with call bell/phone within reach;with chair alarm set Nurse Communication: Mobility status;Precautions;Weight bearing status PT Visit Diagnosis: History of falling (Z91.81);Unsteadiness on feet (R26.81);Difficulty in walking, not elsewhere classified (R26.2)     Time: [id number] PT Time Calculation (min) (ACUTE ONLY): 25 min  Charges:  $Gait Training: 8-22 mins $Therapeutic Activity: 8-22 mins                     Salaam Battershell P, PT Acute Rehabilitation Services Pager: [phone] Office: [place] 07/21/2019, 8:45 AM

## 2019-07-22 LAB — PHOSPHORUS: Phosphorus: 3.3 mg/dL (ref 2.5–4.6)

## 2019-07-22 NOTE — Progress Notes (Signed)
Physical Therapy Treatment Patient Details Name: Alexis Nichols MRN: [id number] DOB: [birth year] Today's Date: 07/22/2019    History of Present Illness 74 yo female s/p ORIF tib / fib plateau s/p fall on dog bed. PMH: acoustic neuroma removal 11/2018, osteoporosis, HTN, CKD, impaired balance    PT Comments    Pt very pleasant and able to walk to toilet today with assist for pericare in standing. Pt then sat at sink for brushing teeth and finally back to recliner for HEP. Pt with decreased fatigue today and increased strength and ROM LLE. Pt progressing well and educated for all HEP including AAROM for increased dorsiflexion LLE.     Follow Up Recommendations  CIR;Supervision for mobility/OOB     Equipment Recommendations  Rolling walker with 5" wheels;3in1 (PT)    Recommendations for Other Services       Precautions / Restrictions Precautions Precautions: Fall Precaution Comments: unlocked hinged brace LLE, AROM and PROM ok Restrictions LLE Weight Bearing: Non weight bearing    Mobility  Bed Mobility Overal bed mobility: Modified Independent       Supine to sit: HOB elevated     General bed mobility comments: HOB 25 degrees with pt able to pivot to right without assist and moving RLE without use of UE  Transfers Overall transfer level: Needs assistance   Transfers: Sit to/from Stand;Stand Pivot Transfers Sit to Stand: Min guard         General transfer comment: cues for hand placement and to maintain NWB, pt at times trying to perform TDWB. Pt stood from bed/toilet/BSC  Ambulation/Gait Ambulation/Gait assistance: Min guard Gait Distance (Feet): 12 Feet Assistive device: Rolling walker (2 wheeled) Gait Pattern/deviations: Step-to pattern   Gait velocity interpretation: <1.8 ft/sec, indicate of risk for recurrent falls General Gait Details: pt with maintained NWB throughout gait. Pt hopped 12', 4', 5' respectively for repeated trials to bathroom, sink and  chair   Stairs             Wheelchair Mobility    Modified Rankin (Stroke Patients Only)       Balance Overall balance assessment: History of Falls Sitting-balance support: No upper extremity supported;Feet supported Sitting balance-Leahy Scale: Good     Standing balance support: Bilateral upper extremity supported;During functional activity   Standing balance comment: reliant on UE support to maintain NwB LLE                            Cognition Arousal/Alertness: Awake/alert Behavior During Therapy: WFL for tasks assessed/performed Overall Cognitive Status: Within Functional Limits for tasks assessed                                        Exercises General Exercises - Lower Extremity Long Arc Quad: Left;Seated;15 reps;AAROM Straight Leg Raises: Left;Seated;10 reps;AAROM Hip Flexion/Marching: Left;Seated;15 reps;AROM Toe Raises: AAROM;Left;10 reps;Seated(with assist of toe strap to provide full DF and 5 sec hold)    General Comments        Pertinent Vitals/Pain Pain Score: 2  Pain Location: LLE Pain Descriptors / Indicators: Aching;Guarding Pain Intervention(s): Monitored during session;Repositioned    Home Living                      Prior Function            PT Goals (current goals can  now be found in the care plan section) Progress towards PT goals: Progressing toward goals    Frequency    Min 4X/week      PT Plan Current plan remains appropriate    Co-evaluation              AM-PAC PT "6 Clicks" Mobility   Outcome Measure  Help needed turning from your back to your side while in a flat bed without using bedrails?: A Little Help needed moving from lying on your back to sitting on the side of a flat bed without using bedrails?: A Little Help needed moving to and from a bed to a chair (including a wheelchair)?: A Little Help needed standing up from a chair using your arms (e.g., wheelchair or  bedside chair)?: A Little Help needed to walk in hospital room?: A Little Help needed climbing 3-5 steps with a railing? : Total 6 Click Score: 16    End of Session Equipment Utilized During Treatment: Gait belt;Other (comment)(hinged brace) Activity Tolerance: Patient tolerated treatment well Patient left: in chair;with call bell/phone within reach;with chair alarm set Nurse Communication: Mobility status;Precautions;Weight bearing status PT Visit Diagnosis: History of falling (Z91.81);Unsteadiness on feet (R26.81);Difficulty in walking, not elsewhere classified (R26.2)     Time: [id number] PT Time Calculation (min) (ACUTE ONLY): 28 min  Charges:  $Gait Training: 8-22 mins $Therapeutic Activity: 8-22 mins                     Charell Faulk P, PT Acute Rehabilitation Services Pager: [phone] Office: [place] 07/22/2019, 1:22 PM

## 2019-07-22 NOTE — Progress Notes (Signed)
During change of shift bedside rounds, Pt AAOX4. No LOC noted at this time. Update given to charge nurse. Hourly rounds will continue per unit protocol and MD orders. Bed exit alarm maintained and call light and phone within reach.

## 2019-07-22 NOTE — Plan of Care (Signed)
  Problem: Education: Goal: Knowledge of General Education information will improve Description: Including pain rating scale, medication(s)/side effects and non-pharmacologic comfort measures Outcome: Progressing   Problem: Health Behavior/Discharge Planning: Goal: Ability to manage health-related needs will improve Outcome: Progressing   Problem: Clinical Measurements: Goal: Will remain free from infection Outcome: Progressing   Problem: Activity: Goal: Risk for activity intolerance will decrease Outcome: Progressing   Problem: Coping: Goal: Level of anxiety will decrease Outcome: Progressing   Problem: Pain Managment: Goal: General experience of comfort will improve Outcome: Progressing   Problem: Safety: Goal: Ability to remain free from injury will improve Outcome: Progressing   Problem: Skin Integrity: Goal: Risk for impaired skin integrity will decrease Outcome: Progressing   Problem: Education: Goal: Knowledge of the prescribed therapeutic regimen will improve Outcome: Progressing   Problem: Activity: Goal: Ability to increase mobility will improve Outcome: Progressing   Problem: Physical Regulation: Goal: Postoperative complications will be avoided or minimized Outcome: Progressing   Problem: Skin Integrity: Goal: Will show signs of wound healing Outcome: Progressing

## 2019-07-22 NOTE — Plan of Care (Addendum)
  Problem: Pain Management: Goal: Pain level will decrease with appropriate interventions Outcome: Progressing   Problem: Activity: Goal: Ability to increase mobility will improve Outcome: Progressing   Problem: Activity: Goal: Risk for activity intolerance will decrease Outcome: Progressing   Problem: Elimination: Goal: Will not experience complications related to bowel motility Outcome: Progressing   Pt noted with red raised rash on mid back and shoulders. Hydrocortisone cream applied and  Maintained at the bedside. LLE brace appliance intact. LLE elevated on to Pillow.

## 2019-07-22 NOTE — Progress Notes (Signed)
Orthopaedic Trauma Service Progress Note  Patient ID: Alexis Nichols MRN: [id number] DOB/AGE: 1946-02-27 74 y.o.  Subjective:  Doing well  Pain controlled No issues Awaiting insurance decision on CIR   Sitting in bedside chair crocheting    ROS As above  Objective:   VITALS:   Vitals:   07/21/19 1934 07/21/19 2100 07/22/19 0412 07/22/19 0802  BP: (!) 127/44 128/64 (!) 126/56 (!) 130/55  Pulse: 66  64 77  Resp: 15  14 15   Temp: 98.7 F (37.1 C)  98.2 F (36.8 C) (!) 97.5 F (36.4 C)  TempSrc: Oral  Oral Oral  SpO2: 99%  97% 96%  Weight:      Height:        Estimated body mass index is 21.4 kg/m as calculated from the following:   Height as of this encounter: 5\' 2"  (1.575 m).   Weight as of this encounter: 53.1 kg.   Intake/Output      01/04 0701 - 01/05 0700 01/05 0701 - 01/06 0700   P.O. 1077 120   Total Intake(mL/kg) 1077 (20.3) 120 (2.3)   Urine (mL/kg/hr) 600 (0.5)    Stool 0    Total Output 600    Net +477 +120        Urine Occurrence 6 x 1 x   Stool Occurrence 6 x      LABS  Results for orders placed or performed during the hospital encounter of 07/11/19 (from the past 24 hour(s))  Phosphorus     Status: None   Collection Time: 07/22/19  7:29 AM  Result Value Ref Range   Phosphorus 3.3 2.5 - 4.6 mg/dL     PHYSICAL EXAM:    [id number] comfortably in bedside chair Lungs:unlabored Cardiac:regular Abd:+BS Ext: Left Lower Extremity  Dressing c/d/i Hinged brace fitting well, unlocked Ext warm  + DP pulse No pain with passive stretching of lower leg compartments EHL, FHL, lesser toe motor intact Ankle flex, ext, inversion and eversion are intact No DCT  Compartments are soft   Assessment/Plan: 7 Days Post-Op   Principal Problem:  Tibial plateau fracture, left Active Problems:   Osteoporosis   HTN (hypertension)   GERD (gastroesophageal reflux disease)   CKD (chronic kidney disease)   Fracture of tibial shaft, left, closed   Acute blood loss anemia   Vitamin D insufficiency   Acoustic neuroma (HCC)   Impaired functional mobility, balance, gait, and endurance   Anti-infectives (From admission, onward)   Start     Dose/Rate Route Frequency Ordered Stop   07/15/19 2000  ceFAZolin (ANCEF) IVPB 1 g/50 mL premix     1 g 100 mL/hr over 30 Minutes Intravenous Every 12 hours 07/15/19 1257 07/16/19 1044   07/15/19 0800  ceFAZolin (ANCEF) IVPB 2g/100 mL premix     2 g 200 mL/hr over 30 Minutes Intravenous On call to O.R. 07/14/19 1022 07/15/19 0830   07/12/19 1700  ceFAZolin (ANCEF) IVPB 2g/100 mL premix     2 g 200 mL/hr over 30 Minutes Intravenous Every 6 hours 07/12/19 1300 07/12/19 2331   07/12/19 0600  ceFAZolin (ANCEF) IVPB 2g/100 mL premix     2 g 200 mL/hr over 30 Minutes Intravenous On call to O.R. 07/12/19 0147 07/12/19 1125    .  POD/HD#: 79  74 year old female  ground-level fall with complex left bicondylar tibial plateau fracture with shaft extension   -Ground-level fall   -Complex left bicondylar tibial plateau fracture with shaft extension and severe depression of medial plateau             S/p ORIF               NWB x 8 weeks             Unrestricted knee ROM                          Hinged brace can be off for ROM                          Hinged brace on when mobilizing             PT/OT evals             Dressing changes as needed                         Ok to shower and clean leg with soap and water only              Ice and elevate              Ted hose                            PT- please teach HEP for L knee ROM- AROM, PROM. Prone exercises as well. No ROM restrictions.  Quad sets, SLR, LAQ, SAQ, heel slides, stretching, prone flexion and extension ok                             Ankle theraband program, heel cord stretching, toe towel curls, etc               No pillows under bend of knee when at rest, ok to place under heel to help work on extension. Can also use zero knee bone foam if available   - gait disturbance/balance issues/impaired mobility              Continued issue since excision of acoustic neuroma              Increased fall risk with subsequent risk for secondary fracture             Think she needs aggressive therapy to maximize her recovery beyond what she would get at SNF                 - Pain management:             Continue with current pain management                         Scheduled tylenol                          PRN norco    - ABL anemia/Hemodynamics             stable   - Medical issues              Home medications               Vitamin d insufficiency  Supplement                Hypophosphatemia- improved                          May be related to vitamin d insufficiency                         Maalox use on initial admission likely contributed, this has been dc'd for several days                          Likely exacerbated due to chronic CKD                          PTH levels look good                         PO supplementation completed                                                      - DVT/PE prophylaxis:             Lovenox x 21 more days              scds             motilize   - ID:              Perioperative antibiotics completed    - Metabolic Bone Disease:             Osteoporosis and vitamin D insufficiency                         Patient's last bone density scan was in 2014.  There was one ordered for November 2019 which does not appear to have been done.  Would definitely recommend bone density scan in the next 4 to 8 weeks.  Her fracture does appear to be suggestive of worsening osteoporosis.  She would likely benefit from pharmacologic treatment of her osteoporosis.  Agent  selection would be dependent on the degree of change between her scan from 2014 to the most recent one               vitamin D and vitamin C supplementation             Calcium levels look appropriate.  Continue with dietary acquisition of calcium through whole foods               Magnesium levels look good             PTH looks ok                 Renal disease also a factor here    - Activity:             Nonweightbearing left leg             Up with assistance   - FEN/GI prophylaxis/Foley/Lines:             Regular diet             NSL IVF     - Impediments  to fracture healing:             Osteoporosis              Vitamin d insufficiency              Low energy fracture   - Dispo:             therapy              CIR eval              SW eval for SNF In the event that pt is not a CIR candidate              Ortho issues stable, await for CIR eval (insurance decision)    Jari Pigg, PA-C [phone] (C) 07/22/2019, 10:58 AM  [facility] [place] [address] [phone] Jenetta [phone] (F)   After 6pm on weekdays please call office number to get in touch with on call provider or refer to [place] and look to see who is on call for the Sports Medicine Call Group which is listed under orthopaedics   On Weekends please call office number to get in touch with on call provider or refer to [place] and look to see who is on call for the Sports Medicine Call Group which is listed under orthopaedics

## 2019-07-22 NOTE — Progress Notes (Signed)
Nutrition Follow-up  DOCUMENTATION CODES:   Not applicable  INTERVENTION:   -Continue MVI with minerals daily -Continue Ensure Enlive po daily, each supplement provides 350 kcal and 20 grams of protein  NUTRITION DIAGNOSIS:   Increased nutrient needs related to post-op healing as evidenced by estimated needs.  Ongoing  GOAL:   Patient will meet greater than or equal to 90% of their needs  Progressing   MONITOR:   PO intake, Supplement acceptance, Labs, Weight trends, Skin, I & O's  REASON FOR ASSESSMENT:   Consult Assessment of nutrition requirement/status  ASSESSMENT:   Alexis Nichols is a 74 y.o. female with past medical history significant for vestibular schwannoma status post resection, CKD, and cirrhosis. Patient slipped on a dog bed which caused her to fall onto her left leg. She heard a pop in her knee and felt immediate pain. She was taken to [facility] Emergency Department by EMS. X-ray shows comminuted fracture of left tib-fib extending proximally towards her knee. CT scan was then obtained and showed comminuted fracture of proximal tibia with involvement of tibial plateau and depressed fracture. Patient was transferred to [facility] for further evaluation.  12/26- s/p PROCEDURE:Application of multiplanar external fixator to the left femur and tibia 12/29- s/p Procedure(s): 1. OPEN REDUCTION INTERNAL FIXATION (ORIF) TIBIA FRACTURE (Left) 2. OPEN REDUCTION INTERNAL FIXATION (ORIF) BICONDYLAR TIBIAL PLATEAU (Left) 3. Removal External Fixation Leg (Left) LEFT TIBIA  Reviewed I/O's: +477 ml x 24 hours and -2.1 L since admission  UOP: 600 ml x 24 hours  Pt resting quietly in recliner chair at time of visit. She has had good appetite. Meal completion 50-100%.   Per chart review, pt awaiting on insurance authorizations and approval for CIR (hopefull today).   Labs reviewed: CBGS: 122-152.  Diet Order:   Diet Order            Diet regular Room service  appropriate? Yes; Fluid consistency: Thin  Diet effective now              EDUCATION NEEDS:   No education needs have been identified at this time  Skin:  Skin Assessment: Skin Integrity Issues: Skin Integrity Issues:: Incisions Incisions: closed lt leg  Last BM:  07/21/19  Height:   Ht Readings from Last 1 Encounters:  07/13/19 5\' 2"  (1.575 m)    Weight:   Wt Readings from Last 1 Encounters:  07/13/19 53.1 kg    Ideal Body Weight:  50 kg  BMI:  Body mass index is 21.4 kg/m.  Estimated Nutritional Needs:   Kcal:  1650-1850  Protein:  80-95 grams  Fluid:  > 1.6 L    Keithon Mccoin A. Jimmye Norman, RD, LDN, [place] Registered Dietitian II Certified Diabetes Care and Education Specialist Pager: [phone] After hours Pager: [phone]

## 2019-07-23 LAB — SARS CORONAVIRUS 2 (TAT 6-24 HRS): SARS Coronavirus 2: NEGATIVE

## 2019-07-23 NOTE — Progress Notes (Addendum)
   07/23/19 1512  SNF Authorization Status  SNF Authorization Type SNF Facility Authorization Request  SNF Auth Started 07/23/19  SNF Auth Start Time 1512  SNF Authorization Status Started   Bed offer has been accepted at [facility] by the patient/daughters. Insurance Josem Kaufmann has been initiated by the Admissions Coordinator([facility]).  Midge Minium MSN, RN, NCM-BC, ACM-RN [phone]

## 2019-07-23 NOTE — Plan of Care (Signed)
  Problem: Education: Goal: Knowledge of General Education information will improve Description: Including pain rating scale, medication(s)/side effects and non-pharmacologic comfort measures Outcome: Progressing   Problem: Health Behavior/Discharge Planning: Goal: Ability to manage health-related needs will improve Outcome: Progressing   Problem: Clinical Measurements: Goal: Will remain free from infection Outcome: Progressing   Problem: Activity: Goal: Risk for activity intolerance will decrease Outcome: Progressing   Problem: Pain Managment: Goal: General experience of comfort will improve Outcome: Progressing   Problem: Safety: Goal: Ability to remain free from injury will improve Outcome: Progressing   Problem: Skin Integrity: Goal: Risk for impaired skin integrity will decrease Outcome: Progressing   Problem: Education: Goal: Knowledge of the prescribed therapeutic regimen will improve Outcome: Progressing   Problem: Activity: Goal: Ability to increase mobility will improve Outcome: Progressing   Problem: Physical Regulation: Goal: Postoperative complications will be avoided or minimized Outcome: Progressing

## 2019-07-23 NOTE — Progress Notes (Signed)
Orthopaedic Trauma Service Progress Note  Patient ID: Alexis Nichols MRN: [id number] DOB/AGE: 03-08-1946 74 y.o.  Subjective:  No acute issues Still waiting on decision from insurance company for [place] with therapy   ROS As above  Objective:   VITALS:   Vitals:   07/22/19 1521 07/22/19 2116 07/23/19 0516 07/23/19 0758  BP: (!) 123/53 114/74 126/62 (!) 124/44  Pulse: 73 97 90 72  Resp:  15 18 17   Temp: 98.1 F (36.7 C) 98.8 F (37.1 C) 98.4 F (36.9 C) 98.8 F (37.1 C)  TempSrc: Oral Oral Oral Oral  SpO2: 100% 96% 95% 96%  Weight:      Height:        Estimated body mass index is 21.4 kg/m as calculated from the following:   Height as of this encounter: 5\' 2"  (1.575 m).   Weight as of this encounter: 53.1 kg.   Intake/Output      01/05 0701 - 01/06 0700 01/06 0701 - 01/07 0700   P.O. 360    Total Intake(mL/kg) 360 (6.8)    Urine (mL/kg/hr) 500 (0.4)    Stool 0    Total Output 500    Net -140         Urine Occurrence 5 x    Stool Occurrence 2 x      LABS  No results found for this or any previous visit (from the past 24 hour(s)).   PHYSICAL EXAM:   Gen: in bed, NAD, appears well  Ext:       Left Lower Extremity   Dressing c/d/i  Hinged brace intact  Ext warm   Swelling stable  No DCT   Compartments are soft  No acute changes in exam   Assessment/Plan: 8 Days Post-Op   Principal Problem:   Tibial plateau fracture, left Active Problems:   Osteoporosis   HTN (hypertension)   GERD (gastroesophageal reflux disease)   CKD (chronic kidney disease)   Fracture of tibial shaft, left, closed   Acute blood loss anemia   Vitamin D insufficiency   Acoustic neuroma (HCC)   Impaired functional mobility, balance, gait, and endurance   Anti-infectives (From admission, onward)   Start     Dose/Rate Route Frequency Ordered Stop   07/15/19 2000  ceFAZolin (ANCEF) IVPB 1  g/50 mL premix     1 g 100 mL/hr over 30 Minutes Intravenous Every 12 hours 07/15/19 1257 07/16/19 1044   07/15/19 0800  ceFAZolin (ANCEF) IVPB 2g/100 mL premix     2 g 200 mL/hr over 30 Minutes Intravenous On call to O.R. 07/14/19 1022 07/15/19 0830   07/12/19 1700  ceFAZolin (ANCEF) IVPB 2g/100 mL premix     2 g 200 mL/hr over 30 Minutes Intravenous Every 6 hours 07/12/19 1300 07/12/19 2331   07/12/19 0600  ceFAZolin (ANCEF) IVPB 2g/100 mL premix     2 g 200 mL/hr over 30 Minutes Intravenous On call to O.R. 07/12/19 0147 07/12/19 1125    .  POD/HD#: 16  74 year old female ground-level fall with complex left bicondylar tibial plateau fracture with shaft extension   -Ground-level fall   -Complex left bicondylar tibial plateau fracture with shaft extension and severe depression of medial plateau             S/p ORIF  NWB x 8 weeks             Unrestricted knee ROM                          Hinged brace can be off for ROM                          Hinged brace on when mobilizing             PT/OT evals             Dressing changes as needed                         Ok to shower and clean leg with soap and water only              Ice and elevate              Ted hose                            PT- please teach HEP for L knee ROM- AROM, PROM. Prone exercises as well. No ROM restrictions.  Quad sets, SLR, LAQ, SAQ, heel slides, stretching, prone flexion and extension ok                            Ankle theraband program, heel cord stretching, toe towel curls, etc               No pillows under bend of knee when at rest, ok to place under heel to help work on extension. Can also use zero knee bone foam if available   - gait disturbance/balance issues/impaired mobility              Continued issue since excision of acoustic neuroma              Increased fall risk with subsequent risk for secondary fracture             Think she needs aggressive therapy to maximize her  recovery beyond what she would get at SNF                 - Pain management:             Continue with current pain management                         Scheduled tylenol                          PRN norco    - ABL anemia/Hemodynamics             stable   - Medical issues              Home medications               Vitamin d insufficiency                          Supplement                Hypophosphatemia- improved  May be related to vitamin d insufficiency                         Maalox use on initial admission likely contributed, this has been dc'd for several days                          Likely exacerbated due to chronic CKD                          PTH levels look good                         PO supplementation completed                                                      - DVT/PE prophylaxis:             Lovenox x 3 more weeks (end date of 08/11/2019)             scds             motilize   - ID:              Perioperative antibiotics completed    - Metabolic Bone Disease:             Osteoporosis and vitamin D insufficiency                         Patient's last bone density scan was in 2014.  There was one ordered for November 2019 which does not appear to have been done.  Would definitely recommend bone density scan in the next 4 to 8 weeks.  Her fracture does appear to be suggestive of worsening osteoporosis.  She would likely benefit from pharmacologic treatment of her osteoporosis.  Agent selection would be dependent on the degree of change between her scan from 2014 to the most recent one               vitamin D and vitamin C supplementation             Calcium levels look appropriate.  Continue with dietary acquisition of calcium through whole foods               Magnesium levels look good             PTH looks ok                 Renal disease also a factor here    - Activity:             Nonweightbearing left leg             Up with  assistance   - FEN/GI prophylaxis/Foley/Lines:             Regular diet             NSL IVF     - Impediments to fracture healing:             Osteoporosis              Vitamin d insufficiency  Low energy fracture   - Dispo:             therapy              CIR eval              SW eval for SNF In the event that pt is not a CIR candidate              Ortho issues stable, await for CIR eval (insurance decision)      Jari Pigg, PA-C [phone] (C) 07/23/2019, 8:27 AM  [facility] [place] [address] [phone] Jenetta [phone] (F)   After 6pm on weekdays please call office number to get in touch with on call provider or refer to [place] and look to see who is on call for the Sports Medicine Call Group which is listed under orthopaedics   On Weekends please call office number to get in touch with on call provider or refer to [place] and look to see who is on call for the Sports Medicine Call Group which is listed under orthopaedics

## 2019-07-23 NOTE — Progress Notes (Addendum)
Orthopaedic Trauma Service Follow up   Attempted to call pts daughter Mrs. Wynetta Emery to discuss insurance denial of CIR and what the family would like to do now Left voicemail   Jari Pigg, PA-C [phone] (C) 07/23/2019, 11:07 AM  [facility] [place] [address] [phone] Jenetta [phone] (F)   After 6pm on weekdays please call office number to get in touch with on call provider or refer to [place] and look to see who is on call for the Sports Medicine Call Group which is listed under orthopaedics   On Weekends please call office number to get in touch with on call provider or refer to [place] and look to see who is on call for the Sports Medicine Call Group which is listed under orthopaedics

## 2019-07-23 NOTE — Progress Notes (Signed)
Inpatient Rehab Admissions Coordinator:   Received denial from pt's insurance.  Will need to seek f/u in lower level of care.   Shann Medal, PT, DPT Admissions Coordinator [phone] 07/23/19  9:04 AM

## 2019-07-23 NOTE — TOC Progression Note (Addendum)
Transition of Care [facility]) - Progression Note    Patient Details  Name: Alexis Nichols MRN: [id number] Date of Birth: 1946/04/07  [facility]) CM/SW [place] MSN, RN, NCM-BC, [place] [phone] Phone Number: 07/23/2019, 11:55 AM  Clinical Narrative:    Insurance denial received for CIR. CM spoke to the patient to discuss the POC and DC options. CM explained the ST SNF rehab process, with the patient agreeable to a SNF post-discharge. CMS SNF Compare provided with the patient also verbalizing she had researched facilities in [facility] with preferences: [facility], [place] and [place]. FL2 sent to the requesting facilities-pending bed offers and COVID results. CM team will continue to follow.    Expected Discharge Plan: Skilled Nursing Facility Barriers to Discharge: SNF Pending bed offer, Insurance Authorization  Expected Discharge Plan and Services Expected Discharge Plan: [place] In-house Referral: NA Discharge Planning Services: CM Consult Post Acute Care Choice: [place] Living arrangements for the past 2 months: Single Family Home                 DME Arranged: N/A DME Agency: NA       HH Arranged: NA HH Agency: NA         Social Determinants of Health (SDOH) Interventions    Readmission Risk Interventions No flowsheet data found.

## 2019-07-23 NOTE — Care Management Important Message (Signed)
Important Message  Patient Details  Name: Alexis Nichols MRN: [id number] Date of Birth: 1946-05-12   Medicare Important Message Given:  Yes     Orbie Pyo 07/23/2019, 2:44 PM

## 2019-07-23 NOTE — Progress Notes (Signed)
Physical Therapy Treatment Patient Details Name: Alexis Nichols MRN: [id number] DOB: 1945-07-28 Today's Date: 07/23/2019    History of Present Illness 74 yo female s/p ORIF tib / fib plateau s/p fall on dog bed. 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 The pt was able to demo improvements in dynamic balance and endurance with ambulation, but continues to have some difficulty maintaining NWB with static stance (pt uses TDWB). Despite improvements, the pt still has sig deficits in endurance and functional mobility/independence due to above dx and NWB status, and therefore she will continue to benefit from skilled PT both acutely and prior to d/c home.     Follow Up Recommendations  SNF;Supervision for mobility/OOB(pt insurance declined CIR, d/c to SNF will be next best plan of care)     Equipment Recommendations  Rolling walker with 5" wheels;3in1 (PT)    Recommendations for Other Services       Precautions / Restrictions Precautions Precautions: Fall Precaution Comments: unlocked hinged brace LLE, AROM and PROM ok Required Braces or Orthoses: Other Brace Other Brace: hinge brace Restrictions Weight Bearing Restrictions: Yes LLE Weight Bearing: Non weight bearing Other Position/Activity Restrictions: pt benefits from continues reminders to maintain NWB rather than TDWB    Mobility  Bed Mobility               General bed mobility comments: OOB in recliner at beginning and end of session  Transfers Overall transfer level: Needs assistance Equipment used: Rolling walker (2 wheeled) Transfers: Sit to/from Stand Sit to Stand: Min guard         General transfer comment: cues for hand placement and to maintain NWB, pt at times trying to perform TDWB. Pt stood from bed/toilet/BSC  Ambulation/Gait Ambulation/Gait  assistance: Min guard Gait Distance (Feet): 20 Feet(20 ft + 15 ft) Assistive device: Rolling walker (2 wheeled) Gait Pattern/deviations: (swing-to pattern)   Gait velocity interpretation: <1.31 ft/sec, indicative of household ambulator General Gait Details: pt with maintained NWB throughout gait. Pt hopped 20 ft, then 15 ft in room and to bathroom. The pt was able to maintain NWB while ambulating, but tends to use TDWB with static stance, verbal reminders to maintain NWB   Stairs             Wheelchair Mobility    Modified Rankin (Stroke Patients Only)       Balance Overall balance assessment: History of Falls Sitting-balance support: No upper extremity supported;Feet supported Sitting balance-Leahy Scale: Good Sitting balance - Comments: supervision   Standing balance support: Bilateral upper extremity supported;During functional activity Standing balance-Leahy Scale: Poor Standing balance comment: reliant on UE support to maintain NwB LLE                            Cognition Arousal/Alertness: Awake/alert Behavior During Therapy: WFL for tasks assessed/performed Overall Cognitive Status: Within Functional Limits for tasks assessed                                        Exercises      General Comments        Pertinent Vitals/Pain Pain Assessment: Faces Faces Pain Scale: Hurts a little bit Pain Location: LLE  Pain Descriptors / Indicators: Aching;Guarding Pain Intervention(s): Limited activity within patient's tolerance;Monitored during session;Repositioned    Home Living                      Prior Function            PT Goals (current goals can now be found in the care plan section) Acute Rehab PT Goals Patient Stated Goal: return to independence PT Goal Formulation: With patient Time For Goal Achievement: 07/30/19 Potential to Achieve Goals: Good Progress towards PT goals: Progressing toward goals     Frequency    Min 2X/week      PT Plan Discharge plan needs to be updated;Frequency needs to be updated    Co-evaluation              AM-PAC PT "6 Clicks" Mobility   Outcome Measure  Help needed turning from your back to your side while in a flat bed without using bedrails?: A Little Help needed moving from lying on your back to sitting on the side of a flat bed without using bedrails?: A Little Help needed moving to and from a bed to a chair (including a wheelchair)?: A Little Help needed standing up from a chair using your arms (e.g., wheelchair or bedside chair)?: A Little Help needed to walk in hospital room?: A Little Help needed climbing 3-5 steps with a railing? : Total 6 Click Score: 16    End of Session Equipment Utilized During Treatment: Gait belt;Other (comment)(hinged brace) Activity Tolerance: Patient tolerated treatment well Patient left: in chair;with call bell/phone within reach;with chair alarm set Nurse Communication: Mobility status;Precautions;Weight bearing status PT Visit Diagnosis: History of falling (Z91.81);Unsteadiness on feet (R26.81);Difficulty in walking, not elsewhere classified (R26.2)     Time: [id number] PT Time Calculation (min) (ACUTE ONLY): 36 min  Charges:  $Gait Training: 23-37 mins                     Karma Ganja, PT, DPT   Acute Rehabilitation Department [phone]   Otho Bellows 07/23/2019, 1:29 PM

## 2019-07-24 MED ORDER — ADULT MULTIVITAMIN W/MINERALS CH: 1.0000 | ORAL_TABLET | Freq: Every day | ORAL | Status: DC

## 2019-07-24 MED ORDER — METHOCARBAMOL 500 MG PO TABS: 500.0000 mg | ORAL_TABLET | Freq: Three times a day (TID) | ORAL | 0 refills | Status: DC | PRN

## 2019-07-24 MED ORDER — ASCORBIC ACID 500 MG PO TABS: 500.0000 mg | ORAL_TABLET | Freq: Every day | ORAL | Status: DC

## 2019-07-24 MED ORDER — ENSURE ENLIVE PO LIQD: 237.0000 mL | Freq: Every day | ORAL | 12 refills | Status: AC

## 2019-07-24 MED ORDER — HYDROCODONE-ACETAMINOPHEN 5-325 MG PO TABS: 1.0000 | ORAL_TABLET | ORAL | 0 refills | Status: DC | PRN

## 2019-07-24 MED ORDER — ACETAMINOPHEN 500 MG PO TABS: 500.0000 mg | ORAL_TABLET | Freq: Two times a day (BID) | ORAL | 0 refills | Status: AC

## 2019-07-24 MED ORDER — DOCUSATE SODIUM 100 MG PO CAPS: 100.0000 mg | ORAL_CAPSULE | Freq: Two times a day (BID) | ORAL | 0 refills | Status: DC

## 2019-07-24 MED ORDER — VITAMIN D3 25 MCG PO TABS: 4000.0000 [IU] | ORAL_TABLET | Freq: Every day | ORAL | Status: DC

## 2019-07-24 MED ORDER — ENOXAPARIN SODIUM 40 MG/0.4ML ~~LOC~~ SOLN: 40.0000 mg | SUBCUTANEOUS | 0 refills | Status: DC

## 2019-07-24 NOTE — Progress Notes (Signed)
OT Cancellation Note  Patient Details Name: AIRA OBENOUR MRN: [id number] DOB: 1946-04-08   Cancelled Treatment:    Reason Eval/Treat Not Completed: Other (comment). Per chart review pt is for SNF today. Will defer ongoing OT needs to next venue of care.   Tyrone Schimke, OT Acute Rehabilitation Services Pager: [phone] Office: [phone]  07/24/2019, 11:24 AM

## 2019-07-24 NOTE — Progress Notes (Signed)
Pt belongings gathered and IV removed. Paperwork and AVS in drawer fro [facility]. Pt is dressed and family is informed of d/c. All questions answered to satisfaction, will continue to monitor Alexis Nichols Elon Spanner, RN 1:09 PM 07/24/2019

## 2019-07-24 NOTE — Discharge Summary (Signed)
Orthopaedic Trauma Service (OTS) Discharge Summary   Patient ID: [name] MRN: [id number] DOB/AGE: [birth year] 74 y.o.  Admit date: 07/11/2019 Discharge date: 07/24/2019  Admission Diagnoses: Comminuted bicondylar left tibial plateau fracture Closed left tibial shaft fracture Hypertension GERD CKD Osteoporosis History of acoustic neuroma excision Impaired balance and gait   Discharge Diagnoses:  Principal Problem:   Tibial plateau fracture, left Active Problems:   Osteoporosis   HTN (hypertension)   GERD (gastroesophageal reflux disease)   CKD (chronic kidney disease)   Fracture of tibial shaft, left, closed   Acute blood loss anemia   Vitamin D insufficiency   Acoustic neuroma (HCC)   Impaired functional mobility, balance, gait, and endurance   Past Medical History:  Diagnosis Date  . Acoustic neuroma ([place])   . CKD (chronic kidney disease)   . Fracture of tibial shaft, left, closed 07/14/2019  . GERD (gastroesophageal reflux disease)   . HTN (hypertension)   . Osteoporosis   . Vitamin D insufficiency 07/14/2019     Procedures Performed:  [id number]. Alexis Nichols  Application of left knee spanning external fixator  07/15/2019-Dr. Handy  1. OPEN REDUCTION INTERNAL FIXATION (ORIF) TIBIA FRACTURE (Left) 2. OPEN REDUCTION INTERNAL FIXATION (ORIF) BICONDYLAR TIBIAL PLATEAU (Left) 3. Removal External Fixation Leg (Left) LEFT TIBIA  Discharged Condition: good  Hospital Course:   Patient is a very pleasant 74 year old white female with a complex medical history who was admitted on 07/11/2019 for complex left bicondylar tibial plateau fracture following a fall.  Please see consult for full summary of her history.  Patient was admitted to the orthopedic service on 07/11/2019 and then was taken to the operating room initially for application of a spanning external fixator on 07/12/2019.  Due to the complexity of the injury it was felt that she needed the  expertise of the orthopedic trauma service.  OTS was consulted.  Initial consultation by the OTS was performed on 07/14/2019.  We determined that she was amenable to definitive fixation during this initial hospital stay.  She was then taken to the operating room on 07/15/2019 for the procedures noted above.  Patient was then transferred to the PACU for care from anesthesia and transferred back to the floor on the orthopedic unit for continued observation, pain control and therapies.  Due to her living situation we felt that the patient would be a inpatient rehab candidate along due to the fact that she is recently had her acoustic neuroma excised resulting in continued gait and balance disturbances.  Inpatient rehab consult was placed immediately on postoperative day #1 however due to the holidays the referral was not received by the insurance company until 07/21/2019.  After review of the documentation patient was denied for inpatient rehab on 07/23/2019.  During the process of evaluating her for inpatient rehab we did a concomitant consult for skilled nursing facility.  Once we were made aware of the inpatient rehab denial injuries were changed to get the patient to a skilled nursing facility as she did not have the capacity to return to her family members house due to unavailability of 24/7 care  Overall patient's hospital stay was really uncomplicated.  She had some mild issues that were easily correctable.  Postoperative day #1 labs did demonstrate vitamin D insufficiency and she was started on supplementation for this.  She was also noted to be hypophosphatemic.  This was likely multifactorial due to vitamin D deficiency, chronic kidney disease she was also on Maalox which was started on admission  and this was discontinued.  We did also start her on oral supplementation Phos neutral 250 mg 3 times daily.  This did correct her phosphorus levels.  Patient was covered with antibiotics for routine perioperative  antibiosis.  She was started on Lovenox for DVT and PE prophylaxis postoperatively as well  Patient started working with physical therapy on postoperative day #1 and was very diligent about working with them on a daily basis.  Patient did receive 2 units of packed red blood cells during her admission this was done preoperatively prior to definitive fixation.  On initial admission she did have some mild elevations in her BUN and creatinine but this was rapidly corrected with fluid administration and blood administration.  Ultimately on postoperative day #9 patient was discharged to skilled nursing facility in stable condition.  Patient will follow-up with orthopedics and 1 week from discharge She is nonweightbearing on her left leg for 8 weeks from surgery Unrestricted range of motion left knee and ankle She is encouraged to participate for therapy daily as well as do additional exercises on her own when not formally working with therapy.  Patient will remain on Lovenox until 08/11/2019 for DVT and PE prophylaxis  Consults: None  Significant Diagnostic Studies: labs:   Results for DUSTINA, RIVENBURG (MRN [id number]) as of 07/24/2019 11:00  Ref. Range 07/18/2019 02:28  WBC Latest Ref Range: 4.0 - 10.5 K/uL 11.8 (H)  RBC Latest Ref Range: 3.87 - 5.11 MIL/uL 3.64 (L)  Hemoglobin Latest Ref Range: 12.0 - 15.0 g/dL 10.5 (L)  HCT Latest Ref Range: 36.0 - 46.0 % 33.5 (L)  MCV Latest Ref Range: 80.0 - 100.0 fL 92.0  MCH Latest Ref Range: 26.0 - 34.0 pg 28.8  MCHC Latest Ref Range: 30.0 - 36.0 g/dL 31.3  RDW Latest Ref Range: 11.5 - 15.5 % 17.2 (H)  Platelets Latest Ref Range: 150 - 400 K/uL 273  nRBC Latest Ref Range: 0.0 - 0.2 % 0.0   Results for MEHER, PAULLIN (MRN [id number]) as of 07/24/2019 11:00  Ref. Range 07/17/2019 02:33  PTH, Intact Latest Ref Range: 15 - 65 pg/mL 48  Calcium, Total (PTH) Latest Ref Range: 8.7 - 10.3 mg/dL 8.8  PTH Interp Unknown Comment   Results for YARELIE, BURKY (MRN  [id number]) as of 07/24/2019 11:00  Ref. Range 07/14/2019 03:28  Vitamin D, 25-Hydroxy Latest Ref Range: 30 - 100 ng/mL 27.19 (L)    Treatments: IV hydration, antibiotics: Ancef, analgesia: acetaminophen and norco, anticoagulation: LMW heparin, therapies: PT, OT, RN and SW and surgery: as above   Discharge Exam:     Orthopaedic Trauma Service Progress Note   Patient ID: Alexis Nichols MRN: [id number] DOB/AGE: Dec 11, 1945 74 y.o.   Subjective:   No issues Pain controlled Ready to go to SNF   Repeat covid screen is negative   No CP or SOB No abd pain  No N/V     Pt with regular HR on my exam (electronic stethoscope)   ROS As above Objective:    VITALS:         Vitals:    07/23/19 1429 07/23/19 2028 07/24/19 0237 07/24/19 0833  BP: 135/70 (!) 127/51 124/64 (!) 146/68  Pulse: 75 98 (!) 59 (!) 111  Resp: 15     17  Temp: 98.1 F (36.7 C) 98.5 F (36.9 C) 98.9 F (37.2 C) 99.5 F (37.5 C)  TempSrc: Oral Oral Oral Oral  SpO2: 99% 100% 98% 100%  Weight:  Height:              Estimated body mass index is 21.4 kg/m as calculated from the following:   Height as of this encounter: 5\' 2"  (1.575 m).   Weight as of this encounter: 53.1 kg.     Intake/Output      01/06 0701 - 01/07 0700 01/07 0701 - 01/08 0700   P.O. 360    Total Intake(mL/kg) 360 (6.8)    Urine (mL/kg/hr) 1800 (1.4)    Stool     Total Output 1800    Net -1440         Urine Occurrence 1 x       LABS   Lab Results Last 24 Hours       Results for orders placed or performed during the hospital encounter of 07/11/19 (from the past 24 hour(s))  SARS CORONAVIRUS 2 (TAT 6-24 HRS) Nasopharyngeal Nasopharyngeal Swab     Status: None    Collection Time: 07/23/19  1:43 PM    Specimen: Nasopharyngeal Swab  Result Value Ref Range    SARS Coronavirus 2 NEGATIVE NEGATIVE          PHYSICAL EXAM:    Gen: awake, sitting up in chair, very pleasant, appears well  Lungs: clear anterior  fields Cardiac: RRR, s1 and s2 Abd: + BS, NTND Ext:       Left Lower Extremity              Dressing c/d/i             Hinged brace intact             Ext warm              Swelling stable             No DCT              Compartments are soft             Motor and sensory functions intact             No pain with passive stretch      Assessment/Plan: 9 Days Post-Op    Principal Problem:   Tibial plateau fracture, left Active Problems:   Osteoporosis   HTN (hypertension)   GERD (gastroesophageal reflux disease)   CKD (chronic kidney disease)   Fracture of tibial shaft, left, closed   Acute blood loss anemia   Vitamin D insufficiency   Acoustic neuroma (HCC)   Impaired functional mobility, balance, gait, and endurance                Anti-infectives (From admission, onward)      Start     Dose/Rate Route Frequency Ordered Stop    07/15/19 2000   ceFAZolin (ANCEF) IVPB 1 g/50 mL premix     1 g 100 mL/hr over 30 Minutes Intravenous Every 12 hours 07/15/19 1257 07/16/19 1044    07/15/19 0800   ceFAZolin (ANCEF) IVPB 2g/100 mL premix     2 g 200 mL/hr over 30 Minutes Intravenous On call to O.R. 07/14/19 1022 07/15/19 0830    07/12/19 1700   ceFAZolin (ANCEF) IVPB 2g/100 mL premix     2 g 200 mL/hr over 30 Minutes Intravenous Every 6 hours 07/12/19 1300 07/12/19 2331    07/12/19 0600   ceFAZolin (ANCEF) IVPB 2g/100 mL premix     2 g 200 mL/hr over 30 Minutes Intravenous On call to O.R.  07/12/19 0147 07/12/19 1125       .   POD/HD#: 6   74 year old female ground-level fall with complex left bicondylar tibial plateau fracture with shaft extension   -Ground-level fall   -Complex left bicondylar tibial plateau fracture with shaft extension and severe depression of medial plateau             S/p ORIF               NWB x 8 weeks             Unrestricted knee ROM                          Hinged brace can be off for ROM and when pt in bed or chair                           Hinged brace only needs to be on when mobilizing                         Heel cord stretches, ankle ROM              PT/OT evals             Dressing changes as needed                         Ok to shower and clean leg with soap and water only              Ice and elevate              Ted hose                            PT- please teach HEP for L knee ROM- AROM, PROM. Prone exercises as well. No ROM restrictions.  Quad sets, SLR, LAQ, SAQ, heel slides, stretching, prone flexion and extension ok                            Ankle theraband program, heel cord stretching, toe towel curls, etc               No pillows under bend of knee when at rest, ok to place under heel to help work on extension. Can also use zero knee bone foam if available   - gait disturbance/balance issues/impaired mobility              Continued issue since excision of acoustic neuroma              Increased fall risk with subsequent risk for secondary fracture             Think she needs aggressive therapy to maximize her recovery beyond what she would get at SNF                 - Pain management:             Continue with current pain management                         Scheduled tylenol  PRN norco    - ABL anemia/Hemodynamics             stable   - Medical issues              Home medications               Vitamin d insufficiency                          Supplement                Hypophosphatemia- improved                          May be related to vitamin d insufficiency                         Maalox use on initial admission likely contributed, this has been dc'd for several days                          Likely exacerbated due to chronic CKD                          PTH levels look good                         PO supplementation completed                                                      - DVT/PE prophylaxis:             Lovenox x 3 more weeks (end date of 08/11/2019)              scds             motilize   - ID:              Perioperative antibiotics completed    - Metabolic Bone Disease:             Osteoporosis and vitamin D insufficiency                         Patient's last bone density scan was in 2014.  There was one ordered for November 2019 which does not appear to have been done.  Would definitely recommend bone density scan in the next 4 to 8 weeks.  Her fracture does appear to be suggestive of worsening osteoporosis.  She would likely benefit from pharmacologic treatment of her osteoporosis.  Agent selection would be dependent on the degree of change between her scan from 2014 to the most recent one               vitamin D and vitamin C supplementation             Calcium levels look appropriate.  Continue with dietary acquisition of calcium through whole foods               Magnesium levels look good             PTH looks ok  Renal disease also a factor here    - Activity:             Nonweightbearing left leg             Up with assistance   - FEN/GI prophylaxis/Foley/Lines:             Regular diet     - Impediments to fracture healing:             Osteoporosis              Vitamin d insufficiency              Low energy fracture   - Dispo:             SNF today              Follow up with ortho in 7-10 days      Weightbearing: NWB LLE Insicional and dressing care: Daily dressing changes with mepilex or 4x4s and tape.  TED hose. ok to leave dressings off if incisions are dry but continue to use TED hose Orthopedic device(s): hinged knee brace, walker.  hinged knee brace only needs to be on when mobilizing, otherwise it can be off  Showering: ok to shower and clean wounds with soap and water  VTE prophylaxis: Lovenox 40mg  qd 3 weeks Pain control: tyelnol, norco Follow - up plan: 1 week Contact information:  Altamese Tilden MD, Ainsley Spinner PA-C     Disposition: Discharge disposition: 03-Skilled [place]       Discharge Instructions    Call MD / Call 911   Complete by: As directed    If you experience chest pain or shortness of breath, CALL 911 and be transported to the hospital emergency room.  If you develope a fever above 101 F, pus (white drainage) or increased drainage or redness at the wound, or calf pain, call your surgeon's office.   Constipation Prevention   Complete by: As directed    Drink plenty of fluids.  Prune juice may be helpful.  You may use a stool softener, such as Colace (over the counter) 100 mg twice a day.  Use MiraLax (over the counter) for constipation as needed.   Diet general   Complete by: As directed    Discharge instructions   Complete by: As directed    Orthopaedic Trauma Service Discharge Instructions   General Discharge Instructions  Orthopaedic Injuries:  Left tibial plateau fracture treated with open reduction internal fixation using plates and screws  WEIGHT BEARING STATUS: Nonweightbearing left leg x 8 weeks postoperatively  RANGE OF MOTION/ACTIVITY: Unrestricted range of motion of left knee and ankle.  Okay to remain out of the hinged knee brace when working on range of motion or sitting in a chair or in bed.  Hinged knee brace needs to be on when moving around with a walker.  Activity as tolerated while maintaining weightbearing restrictions  Bone health: Labs show vitamin D insufficiency.  Continue to take vitamin D and vitamin C.  You will need a bone density scan in the next 4 to 8 weeks to further evaluate your bone health.  You would likely benefit from medications to prevent worsening of your bone quality and to prevent secondary fractures  Wound Care: Daily wound care starting on 07/25/2019.  Please see below  Discharge Wound Care Instructions  Do NOT apply any ointments, solutions or lotions to pin sites or surgical wounds.  These prevent  needed drainage and even though solutions like hydrogen peroxide kill bacteria, they also  damage cells lining the pin sites that help fight infection.  Applying lotions or ointments can keep the wounds moist and can cause them to breakdown and open up as well. This can increase the risk for infection. When in doubt call the office.  Surgical incisions should be dressed daily.  If any drainage is noted, use one layer of adaptic, then gauze, Kerlix, and an ace wrap.  Alternatively you can use Mepilex type dressing for 4 x 4's and tape.  Instead of an Ace wrap you can use a compression sock such as a TED hose  Once the incision is completely dry and without drainage, it may be left open to air out.  Showering may begin 36-48 hours later.  Cleaning gently with soap and water.   DVT/PE prophylaxis: Lovenox 40 mg subcutaneous injection daily until 08/11/2019.  This is to prevent blood clots  Diet: as you were eating previously.  Can use over the counter stool softeners and bowel preparations, such as Miralax, to help with bowel movements.  Narcotics can be constipating.  Be sure to drink plenty of fluids  PAIN MEDICATION USE AND EXPECTATIONS  You have likely been given narcotic medications to help control your pain.  After a traumatic event that results in an fracture (broken bone) with or without surgery, it is ok to use narcotic pain medications to help control one's pain.  We understand that everyone responds to pain differently and each individual patient will be evaluated on a regular basis for the continued need for narcotic medications. Ideally, narcotic medication use should last no more than 6-8 weeks (coinciding with fracture healing).   As a patient it is your responsibility as well to monitor narcotic medication use and report the amount and frequency you use these medications when you come to your office visit.   We would also advise that if you are using narcotic medications, you should take a dose prior to therapy to maximize you participation.  IF YOU ARE ON NARCOTIC  MEDICATIONS IT IS NOT PERMISSIBLE TO OPERATE A MOTOR VEHICLE (MOTORCYCLE/CAR/TRUCK/MOPED) OR HEAVY MACHINERY DO NOT MIX NARCOTICS WITH OTHER CNS (CENTRAL NERVOUS SYSTEM) DEPRESSANTS SUCH AS ALCOHOL   STOP SMOKING OR USING NICOTINE PRODUCTS!!!!  As discussed nicotine severely impairs your body's ability to heal surgical and traumatic wounds but also impairs bone healing.  Wounds and bone heal by forming microscopic blood vessels (angiogenesis) and nicotine is a vasoconstrictor (essentially, shrinks blood vessels).  Therefore, if vasoconstriction occurs to these microscopic blood vessels they essentially disappear and are unable to deliver necessary nutrients to the healing tissue.  This is one modifiable factor that you can do to dramatically increase your chances of healing your injury.    (This means no smoking, no nicotine gum, patches, etc)  DO NOT USE NONSTEROIDAL ANTI-INFLAMMATORY DRUGS (NSAID'S)  Using products such as Advil (ibuprofen), Aleve (naproxen), Motrin (ibuprofen) for additional pain control during fracture healing can delay and/or prevent the healing response.  If you would like to take over the counter (OTC) medication, Tylenol (acetaminophen) is ok.  However, some narcotic medications that are given for pain control contain acetaminophen as well. Therefore, you should not exceed more than 4000 mg of tylenol in a day if you do not have liver disease.  Also note that there are may OTC medicines, such as cold medicines and allergy medicines that my contain tylenol as well.  If you  have any questions about medications and/or interactions please ask your doctor/PA or your pharmacist.      ICE AND ELEVATE INJURED/OPERATIVE EXTREMITY  Using ice and elevating the injured extremity above your heart can help with swelling and pain control.  Icing in a pulsatile fashion, such as 20 minutes on and 20 minutes off, can be followed.    Do not place ice directly on skin. Make sure there is a barrier  between to skin and the ice pack.    Using frozen items such as frozen peas works well as the conform nicely to the are that needs to be iced.  USE AN ACE WRAP OR TED HOSE FOR SWELLING CONTROL  In addition to icing and elevation, Ace wraps or TED hose are used to help limit and resolve swelling.  It is recommended to use Ace wraps or TED hose until you are informed to stop.    When using Ace Wraps start the wrapping distally (farthest away from the body) and wrap proximally (closer to the body)   Example: If you had surgery on your leg or thing and you do not have a splint on, start the ace wrap at the toes and work your way up to the thigh        If you had surgery on your upper extremity and do not have a splint on, start the ace wrap at your fingers and work your way up to the upper arm  IF YOU ARE IN A SPLINT OR CAST DO NOT [place]   If your splint gets wet for any reason please contact the office immediately. You may shower in your splint or cast as long as you keep it dry.  This can be done by wrapping in a cast cover or garbage back (or similar)  Do Not stick any thing down your splint or cast such as pencils, money, or hangers to try and scratch yourself with.  If you feel itchy take benadryl as prescribed on the bottle for itching  IF YOU ARE IN A CAM BOOT (BLACK BOOT)  You may remove boot periodically. Perform daily dressing changes as noted below.  Wash the liner of the boot regularly and wear a sock when wearing the boot. It is recommended that you sleep in the boot until told otherwise    Call office for the following: Temperature greater than 101F Persistent nausea and vomiting Severe uncontrolled pain Redness, tenderness, or signs of infection (pain, swelling, redness, odor or green/yellow discharge around the site) Difficulty breathing, headache or visual disturbances Hives Persistent dizziness or light-headedness Extreme fatigue Any other questions or  concerns you may have after discharge  In an emergency, call 911 or go to an Emergency Department at a nearby hospital    [place]: [phone]   VISIT OUR WEBSITE FOR ADDITIONAL INFORMATION: [email or web address]   Do not put a pillow under the knee. Place it under the heel.   Complete by: As directed    Driving restrictions   Complete by: As directed    No driving   Increase activity slowly as tolerated   Complete by: As directed    Non weight bearing   Complete by: As directed    Laterality: left   Extremity: Lower     Allergies as of 07/24/2019   No Known Allergies     Medication List    TAKE these medications   acetaminophen 500 MG tablet  Commonly known as: TYLENOL Take 1 tablet (500 mg total) by mouth every 12 (twelve) hours.   ascorbic acid 500 MG tablet Commonly known as: VITAMIN C Take 1 tablet (500 mg total) by mouth daily. Start taking on: July 25, 2019   aspirin 81 MG EC tablet Take 81 mg by mouth daily.   docusate sodium 100 MG capsule Commonly known as: COLACE Take 1 capsule (100 mg total) by mouth 2 (two) times daily.   enoxaparin 40 MG/0.4ML injection Commonly known as: LOVENOX Inject 0.4 mLs (40 mg total) into the skin daily for 17 days. Start taking on: July 25, 2019   feeding supplement (ENSURE ENLIVE) Liqd Take 237 mLs by mouth daily at 8 pm.   HYDROcodone-acetaminophen 5-325 MG tablet Commonly known as: NORCO/VICODIN Take 1 tablet by mouth every 4 (four) hours as needed for moderate pain or severe pain.   methocarbamol 500 MG tablet Commonly known as: ROBAXIN Take 1 tablet (500 mg total) by mouth every 8 (eight) hours as needed for muscle spasms.   metoprolol tartrate 25 MG tablet Commonly known as: LOPRESSOR Take 25 mg by mouth daily.   multivitamin with minerals Tabs tablet Take 1 tablet by mouth daily. Start taking on: July 25, 2019   omeprazole 40 MG capsule Commonly known as:  PRILOSEC Take 40 mg by mouth daily.   solifenacin 10 MG tablet Commonly known as: VESICARE Take 10 mg by mouth daily.   spironolactone 50 MG tablet Commonly known as: ALDACTONE Take 50 mg by mouth daily.   Vitamin D3 25 MCG tablet Commonly known as: Vitamin D Take 4 tablets (4,000 Units total) by mouth daily.            Discharge Care Instructions  (From admission, onward)         Start     Ordered   07/24/19 0000  Non weight bearing    Question Answer Comment  Laterality left   Extremity Lower      07/24/19 1048         Follow-up Information    [facility] Preferred SNF Follow up.   Specialty: [place]: rehab Contact information: [address] [place]       Altamese Cochise, MD. Schedule an appointment as soon as possible for a visit in 1 week(s).   Specialty: Orthopedic Surgery Contact information: [place] [address] [phone]           Discharge Instructions and Plan:  Patient is a complex injury to her left proximal tibia.  We were able to achieve excellent fixation with plate osteosynthesis and achieve correct alignment, length, rotation and stability.  We do feel that she should do very well and would anticipate her healing uneventfully.  She is at risk for posttraumatic arthritis but again given intraoperative findings and overall fixation we feel that this is been minimized.  She will be nonweightbearing for a total of 8 weeks postoperatively but has unrestricted range of motion of her hip knee and ankle.  She can continue to work on aggressive range of motion of her knee and ankle along with heel cord stretching.  She can be out of her hinged knee brace and as much as possible only needs to wear it when mobilizing  Continue with ice and elevation for swelling and pain control.  Continue with compression sock or TED hose for swelling  control  She remain on Lovenox until 08/11/2019  for DVT and PE prophylaxis  She will remain on vitamin D and vitamin C.  She will require a bone density scan in the next 4 to 8 weeks for further evaluation of her bone health.  She would likely benefit from pharmacologic management of her poor bone quality so as to prevent secondary fractures.  Bone density scan will help guide medication selection  Patient will follow up with the orthopedic trauma service in 7 to 10 days for suture removal, follow-up x-rays and assessment of her progress  Signed:  Jari Pigg, PA-C [phone] (C) 07/24/2019, 10:49 AM  [facility] [place] [address] [phone] Domingo Sep (F)

## 2019-07-24 NOTE — Progress Notes (Signed)
Report called and given to kim at receiving facility, PTAR received all necessary paperwork and pt belongings. Levenia Skalicky Elon Spanner, RN 07/24/2019 1:19 PM

## 2019-07-24 NOTE — Plan of Care (Signed)

## 2019-07-24 NOTE — Progress Notes (Signed)
Orthopaedic Trauma Service Progress Note  Patient ID: Alexis Nichols MRN: [id number] DOB/AGE: 75-Aug-1947 75 y.o.  Subjective:  No issues Pain controlled Ready to go to SNF  Repeat covid screen is negative  No CP or SOB No abd pain  No N/V   Pt with regular HR on my exam (electronic stethoscope)  ROS As above Objective:   VITALS:   Vitals:   07/23/19 1429 07/23/19 2028 07/24/19 0237 07/24/19 0833  BP: 135/70 (!) 127/51 124/64 (!) 146/68  Pulse: 75 98 (!) 59 (!) 111  Resp: 15   17  Temp: 98.1 F (36.7 C) 98.5 F (36.9 C) 98.9 F (37.2 C) 99.5 F (37.5 C)  TempSrc: Oral Oral Oral Oral  SpO2: 99% 100% 98% 100%  Weight:      Height:        Estimated body mass index is 21.4 kg/m as calculated from the following:   Height as of this encounter: 5\' 2"  (1.575 m).   Weight as of this encounter: 53.1 kg.   Intake/Output      01/06 0701 - 01/07 0700 01/07 0701 - 01/08 0700   P.O. 360    Total Intake(mL/kg) 360 (6.8)    Urine (mL/kg/hr) 1800 (1.4)    Stool     Total Output 1800    Net -1440         Urine Occurrence 1 x      LABS  Results for orders placed or performed during the hospital encounter of 07/11/19 (from the past 24 hour(s))  SARS CORONAVIRUS 2 (TAT 6-24 HRS) Nasopharyngeal Nasopharyngeal Swab     Status: None   Collection Time: 07/23/19  1:43 PM   Specimen: Nasopharyngeal Swab  Result Value Ref Range   SARS Coronavirus 2 NEGATIVE NEGATIVE     PHYSICAL EXAM:   Gen: awake, sitting up in chair, very pleasant, appears well  Lungs: clear anterior fields Cardiac: RRR, s1 and s2 Abd: + BS, NTND Ext:       Left Lower Extremity   Dressing c/d/i             Hinged brace intact             Ext warm              Swelling stable             No DCT              Compartments are soft  Motor and sensory functions intact  No pain with passive stretch    Assessment/Plan: 9  Days Post-Op   Principal Problem:   Tibial plateau fracture, left Active Problems:   Osteoporosis   HTN (hypertension)   GERD (gastroesophageal reflux disease)   CKD (chronic kidney disease)   Fracture of tibial shaft, left, closed   Acute blood loss anemia   Vitamin D insufficiency   Acoustic neuroma (HCC)   Impaired functional mobility, balance, gait, and endurance   Anti-infectives (From admission, onward)   Start     Dose/Rate Route Frequency Ordered Stop   07/15/19 2000  ceFAZolin (ANCEF) IVPB 1 g/50 mL premix     1 g 100 mL/hr over 30 Minutes Intravenous Every 12 hours 07/15/19 1257 07/16/19 1044   07/15/19 0800  ceFAZolin (ANCEF) IVPB 2g/100 mL  premix     2 g 200 mL/hr over 30 Minutes Intravenous On call to O.R. 07/14/19 1022 07/15/19 0830   07/12/19 1700  ceFAZolin (ANCEF) IVPB 2g/100 mL premix     2 g 200 mL/hr over 30 Minutes Intravenous Every 6 hours 07/12/19 1300 07/12/19 2331   07/12/19 0600  ceFAZolin (ANCEF) IVPB 2g/100 mL premix     2 g 200 mL/hr over 30 Minutes Intravenous On call to O.R. 07/12/19 0147 07/12/19 1125    .  POD/HD#: 18  74 year old female ground-level fall with complex left bicondylar tibial plateau fracture with shaft extension   -Ground-level fall   -Complex left bicondylar tibial plateau fracture with shaft extension and severe depression of medial plateau             S/p ORIF               NWB x 8 weeks             Unrestricted knee ROM                          Hinged brace can be off for ROM and when pt in bed or chair                          Hinged brace only needs to be on when mobilizing   Heel cord stretches, ankle ROM              PT/OT evals             Dressing changes as needed                         Ok to shower and clean leg with soap and water only              Ice and elevate              Ted hose                            PT- please teach HEP for L knee ROM- AROM, PROM. Prone exercises as well. No ROM  restrictions.  Quad sets, SLR, LAQ, SAQ, heel slides, stretching, prone flexion and extension ok                            Ankle theraband program, heel cord stretching, toe towel curls, etc               No pillows under bend of knee when at rest, ok to place under heel to help work on extension. Can also use zero knee bone foam if available   - gait disturbance/balance issues/impaired mobility              Continued issue since excision of acoustic neuroma              Increased fall risk with subsequent risk for secondary fracture             Think she needs aggressive therapy to maximize her recovery beyond what she would get at SNF                 - Pain management:             Continue with current pain management  Scheduled tylenol                          PRN norco    - ABL anemia/Hemodynamics             stable   - Medical issues              Home medications               Vitamin d insufficiency                          Supplement                Hypophosphatemia- improved                          May be related to vitamin d insufficiency                         Maalox use on initial admission likely contributed, this has been dc'd for several days                          Likely exacerbated due to chronic CKD                          PTH levels look good                         PO supplementation completed                                                      - DVT/PE prophylaxis:             Lovenox x 3 more weeks (end date of 08/11/2019)             scds             motilize   - ID:              Perioperative antibiotics completed    - Metabolic Bone Disease:             Osteoporosis and vitamin D insufficiency                         Patient's last bone density scan was in 2014.  There was one ordered for November 2019 which does not appear to have been done.  Would definitely recommend bone density scan in the next 4 to 8 weeks.  Her  fracture does appear to be suggestive of worsening osteoporosis.  She would likely benefit from pharmacologic treatment of her osteoporosis.  Agent selection would be dependent on the degree of change between her scan from 2014 to the most recent one               vitamin D and vitamin C supplementation             Calcium levels look appropriate.  Continue with dietary acquisition of calcium through whole foods               Magnesium levels  look good             PTH looks ok                 Renal disease also a factor here    - Activity:             Nonweightbearing left leg             Up with assistance   - FEN/GI prophylaxis/Foley/Lines:             Regular diet     - Impediments to fracture healing:             Osteoporosis              Vitamin d insufficiency              Low energy fracture   - Dispo:  SNF today   Follow up with ortho in 7-10 days     Weightbearing: NWB LLE Insicional and dressing care: Daily dressing changes with mepilex or 4x4s and tape.  TED hose. ok to leave dressings off if incisions are dry but continue to use TED hose Orthopedic device(s): hinged knee brace, walker.  hinged knee brace only needs to be on when mobilizing, otherwise it can be off  Showering: ok to shower and clean wounds with soap and water  VTE prophylaxis: Lovenox 40mg  qd 3 weeks Pain control: tyelnol, norco Follow - up plan: 1 week Contact information:  [place] MD, Ainsley Spinner PA-C   Jari Pigg, PA-C [phone] (C) 07/24/2019, 10:29 AM  [facility] [place] [phone] Jenetta [phone] (F)   After 6pm on weekdays please call office number to get in touch with on call provider or refer to [place] and look to see who is on call for the Sports Medicine Call Group which is listed under orthopaedics   On Weekends please call office number to get in touch with on call provider or refer to [place] and look to see who is  on call for the Sports Medicine Call Group which is listed under orthopaedics

## 2019-07-24 NOTE — Discharge Instructions (Signed)
Orthopaedic Trauma Service Discharge Instructions   General Discharge Instructions  Orthopaedic Injuries:  Left tibial plateau fracture treated with open reduction internal fixation using plates and screws  WEIGHT BEARING STATUS: Nonweightbearing left leg x 8 weeks postoperatively  RANGE OF MOTION/ACTIVITY: Unrestricted range of motion of left knee and ankle.  Okay to remain out of the hinged knee brace when working on range of motion or sitting in a chair or in bed.  Hinged knee brace needs to be on when moving around with a walker.  Activity as tolerated while maintaining weightbearing restrictions  Bone health: Labs show vitamin D insufficiency.  Continue to take vitamin D and vitamin C.  You will need a bone density scan in the next 4 to 8 weeks to further evaluate your bone health.  You would likely benefit from medications to prevent worsening of your bone quality and to prevent secondary fractures  Wound Care: Daily wound care starting on 07/25/2019.  Please see below  Discharge Wound Care Instructions  Do NOT apply any ointments, solutions or lotions to pin sites or surgical wounds.  These prevent needed drainage and even though solutions like hydrogen peroxide kill bacteria, they also damage cells lining the pin sites that help fight infection.  Applying lotions or ointments can keep the wounds moist and can cause them to breakdown and open up as well. This can increase the risk for infection. When in doubt call the office.  Surgical incisions should be dressed daily.  If any drainage is noted, use one layer of adaptic, then gauze, Kerlix, and an ace wrap.  Alternatively you can use Mepilex type dressing for 4 x 4's and tape.  Instead of an Ace wrap you can use a compression sock such as a TED hose  Once the incision is completely dry and without drainage, it may be left open to air out.  Showering may begin 36-48 hours later.  Cleaning gently with soap and water.   DVT/PE  prophylaxis: Lovenox 40 mg subcutaneous injection daily until 08/11/2019.  This is to prevent blood clots  Diet: as you were eating previously.  Can use over the counter stool softeners and bowel preparations, such as Miralax, to help with bowel movements.  Narcotics can be constipating.  Be sure to drink plenty of fluids  PAIN MEDICATION USE AND EXPECTATIONS  You have likely been given narcotic medications to help control your pain.  After a traumatic event that results in an fracture (broken bone) with or without surgery, it is ok to use narcotic pain medications to help control one's pain.  We understand that everyone responds to pain differently and each individual patient will be evaluated on a regular basis for the continued need for narcotic medications. Ideally, narcotic medication use should last no more than 6-8 weeks (coinciding with fracture healing).   As a patient it is your responsibility as well to monitor narcotic medication use and report the amount and frequency you use these medications when you come to your office visit.   We would also advise that if you are using narcotic medications, you should take a dose prior to therapy to maximize you participation.  IF YOU ARE ON NARCOTIC MEDICATIONS IT IS NOT PERMISSIBLE TO OPERATE A MOTOR VEHICLE (MOTORCYCLE/CAR/TRUCK/MOPED) OR HEAVY MACHINERY DO NOT MIX NARCOTICS WITH OTHER CNS (CENTRAL NERVOUS SYSTEM) DEPRESSANTS SUCH AS ALCOHOL   STOP SMOKING OR USING NICOTINE PRODUCTS!!!!  As discussed nicotine severely impairs your body's ability to heal surgical and traumatic  wounds but also impairs bone healing.  Wounds and bone heal by forming microscopic blood vessels (angiogenesis) and nicotine is a vasoconstrictor (essentially, shrinks blood vessels).  Therefore, if vasoconstriction occurs to these microscopic blood vessels they essentially disappear and are unable to deliver necessary nutrients to the healing tissue.  This is one modifiable  factor that you can do to dramatically increase your chances of healing your injury.    (This means no smoking, no nicotine gum, patches, etc)  DO NOT USE NONSTEROIDAL ANTI-INFLAMMATORY DRUGS (NSAID'S)  Using products such as Advil (ibuprofen), Aleve (naproxen), Motrin (ibuprofen) for additional pain control during fracture healing can delay and/or prevent the healing response.  If you would like to take over the counter (OTC) medication, Tylenol (acetaminophen) is ok.  However, some narcotic medications that are given for pain control contain acetaminophen as well. Therefore, you should not exceed more than 4000 mg of tylenol in a day if you do not have liver disease.  Also note that there are may OTC medicines, such as cold medicines and allergy medicines that my contain tylenol as well.  If you have any questions about medications and/or interactions please ask your doctor/PA or your pharmacist.      ICE AND ELEVATE INJURED/OPERATIVE EXTREMITY  Using ice and elevating the injured extremity above your heart can help with swelling and pain control.  Icing in a pulsatile fashion, such as 20 minutes on and 20 minutes off, can be followed.    Do not place ice directly on skin. Make sure there is a barrier between to skin and the ice pack.    Using frozen items such as frozen peas works well as the conform nicely to the are that needs to be iced.  USE AN ACE WRAP OR TED HOSE FOR SWELLING CONTROL  In addition to icing and elevation, Ace wraps or TED hose are used to help limit and resolve swelling.  It is recommended to use Ace wraps or TED hose until you are informed to stop.    When using Ace Wraps start the wrapping distally (farthest away from the body) and wrap proximally (closer to the body)   Example: If you had surgery on your leg or thing and you do not have a splint on, start the ace wrap at the toes and work your way up to the thigh        If you had surgery on your upper extremity and do not  have a splint on, start the ace wrap at your fingers and work your way up to the upper arm  IF YOU ARE IN A SPLINT OR CAST DO NOT [place]   If your splint gets wet for any reason please contact the office immediately. You may shower in your splint or cast as long as you keep it dry.  This can be done by wrapping in a cast cover or garbage back (or similar)  Do Not stick any thing down your splint or cast such as pencils, money, or hangers to try and scratch yourself with.  If you feel itchy take benadryl as prescribed on the bottle for itching  IF YOU ARE IN A CAM BOOT (BLACK BOOT)  You may remove boot periodically. Perform daily dressing changes as noted below.  Wash the liner of the boot regularly and wear a sock when wearing the boot. It is recommended that you sleep in the boot until told otherwise    Call office for  the following:  Temperature greater than 101F  Persistent nausea and vomiting  Severe uncontrolled pain  Redness, tenderness, or signs of infection (pain, swelling, redness, odor or green/yellow discharge around the site)  Difficulty breathing, headache or visual disturbances  Hives  Persistent dizziness or light-headedness  Extreme fatigue  Any other questions or concerns you may have after discharge  In an emergency, call 911 or go to an Emergency Department at a nearby hospital    [place]: [phone]   VISIT OUR WEBSITE FOR ADDITIONAL INFORMATION: [email or web address]

## 2019-07-24 NOTE — Plan of Care (Signed)

## 2019-07-24 NOTE — TOC Transition Note (Signed)
Transition of Care [facility]) - CM/SW Discharge Note   Patient Details  Name: Alexis Nichols MRN: [id number] Date of Birth: April 17, 1946  Transition of Care [facility]) CM/SW Contact:  Midge Minium MSN, RN, NCM-BC, ACM-RN [phone] Phone Number: 07/24/2019, 11:25 AM   Clinical Narrative:    Patient medically stable to transition to [facility] for ST rehab, with insurance auth received. CM sent the discharge paperwork to the Admissions Coordinator at the facility. PTAR will be arranged for 1300.  Please call report to: [phone]; room 711   Final next level of care: Skilled Nursing Facility Barriers to Discharge: No Barriers Identified   Patient Goals and CMS Choice Patient states their goals for this hospitalization and ongoing recovery are:: "to get stronger" CMS Medicare.gov Compare Post Acute Care list provided to:: Patient Choice offered to / list presented to : Patient  Discharge Placement              Patient chooses bed at: [facility] [place] Patient to be transferred to facility by: [place] Name of family member notified: patient Patient and family notified of of transfer: 07/24/19  Discharge Plan and Services In-house Referral: NA Discharge Planning Services: CM Consult Post Acute Care Choice: [place]          DME Arranged: N/A DME Agency: NA       HH Arranged: NA HH Agency: NA        Social Determinants of Health (SDOH) Interventions     Readmission Risk Interventions No flowsheet data found.

## 2020-01-16 ENCOUNTER — Other Ambulatory Visit: Payer: Medicare HMO

## 2020-01-16 ENCOUNTER — Encounter: Payer: Medicare HMO | Admitting: Oncology

## 2020-01-22 ENCOUNTER — Inpatient Hospital Stay: Payer: Medicare HMO | Attending: Oncology | Admitting: Oncology

## 2020-01-22 ENCOUNTER — Encounter: Payer: Self-pay | Admitting: Oncology

## 2020-01-22 ENCOUNTER — Other Ambulatory Visit: Payer: Self-pay

## 2020-01-22 ENCOUNTER — Inpatient Hospital Stay: Payer: Medicare HMO

## 2020-01-22 VITALS — BP 118/53 | HR 58 | Temp 98.0°F | Resp 18 | Ht 62.0 in | Wt 103.9 lb

## 2020-01-22 DIAGNOSIS — N189 Chronic kidney disease, unspecified: Secondary | ICD-10-CM | POA: Diagnosis not present

## 2020-01-22 DIAGNOSIS — R634 Abnormal weight loss: Secondary | ICD-10-CM | POA: Insufficient documentation

## 2020-01-22 DIAGNOSIS — R17 Unspecified jaundice: Secondary | ICD-10-CM

## 2020-01-22 DIAGNOSIS — I129 Hypertensive chronic kidney disease with stage 1 through stage 4 chronic kidney disease, or unspecified chronic kidney disease: Secondary | ICD-10-CM | POA: Insufficient documentation

## 2020-01-22 DIAGNOSIS — E559 Vitamin D deficiency, unspecified: Secondary | ICD-10-CM | POA: Insufficient documentation

## 2020-01-22 DIAGNOSIS — Z87891 Personal history of nicotine dependence: Secondary | ICD-10-CM | POA: Diagnosis not present

## 2020-01-22 DIAGNOSIS — D649 Anemia, unspecified: Secondary | ICD-10-CM | POA: Diagnosis present

## 2020-01-22 LAB — COMPREHENSIVE METABOLIC PANEL
ALT: 17 U/L (ref 0–44)
AST: 22 U/L (ref 15–41)
Albumin: 4.3 g/dL (ref 3.5–5.0)
Alkaline Phosphatase: 98 U/L (ref 38–126)
Anion gap: 8 (ref 5–15)
BUN: 20 mg/dL (ref 8–23)
CO2: 30 mmol/L (ref 22–32)
Calcium: 9.8 mg/dL (ref 8.9–10.3)
Chloride: 99 mmol/L (ref 98–111)
Creatinine, Ser: 1.12 mg/dL — ABNORMAL HIGH (ref 0.44–1.00)
GFR calc Af Amer: 56 mL/min — ABNORMAL LOW (ref >60)
GFR calc non Af Amer: 48 mL/min — ABNORMAL LOW (ref >60)
Glucose, Bld: 113 mg/dL — ABNORMAL HIGH (ref 70–99)
Potassium: 4.2 mmol/L (ref 3.5–5.1)
Sodium: 137 mmol/L (ref 135–145)
Total Bilirubin: 0.6 mg/dL (ref 0.3–1.2)
Total Protein: 7.3 g/dL (ref 6.5–8.1)

## 2020-01-22 LAB — CBC WITH DIFFERENTIAL/PLATELET
Abs Immature Granulocytes: 0.1 10*3/uL — ABNORMAL HIGH (ref 0.00–0.07)
Basophils Absolute: 0.1 10*3/uL (ref 0.0–0.1)
Basophils Relative: 1 %
Eosinophils Absolute: 0 10*3/uL (ref 0.0–0.5)
Eosinophils Relative: 0 %
HCT: 39.2 % (ref 36.0–46.0)
Hemoglobin: 13.5 g/dL (ref 12.0–15.0)
Immature Granulocytes: 1 %
Lymphocytes Relative: 19 %
Lymphs Abs: 1.8 10*3/uL (ref 0.7–4.0)
MCH: 30.5 pg (ref 26.0–34.0)
MCHC: 34.4 g/dL (ref 30.0–36.0)
MCV: 88.5 fL (ref 80.0–100.0)
Monocytes Absolute: 0.5 10*3/uL (ref 0.1–1.0)
Monocytes Relative: 5 %
Neutro Abs: 7.1 10*3/uL (ref 1.7–7.7)
Neutrophils Relative %: 74 %
Platelets: 261 10*3/uL (ref 150–400)
RBC: 4.43 MIL/uL (ref 3.87–5.11)
RDW: 13.2 % (ref 11.5–15.5)
WBC: 9.6 10*3/uL (ref 4.0–10.5)
nRBC: 0 % (ref 0.0–0.2)

## 2020-01-22 LAB — IRON AND TIBC
Iron: 94 ug/dL (ref 28–170)
Saturation Ratios: 25 % (ref 10.4–31.8)
TIBC: 371 ug/dL (ref 250–450)
UIBC: 277 ug/dL

## 2020-01-22 LAB — FERRITIN: Ferritin: 20 ng/mL (ref 11–307)

## 2020-01-22 LAB — TSH: TSH: 2.058 u[IU]/mL (ref 0.350–4.500)

## 2020-01-22 LAB — LACTATE DEHYDROGENASE: LDH: 118 U/L (ref 98–192)

## 2020-01-22 LAB — RETICULOCYTES
Immature Retic Fract: 5.5 % (ref 2.3–15.9)
RBC.: 4.46 MIL/uL (ref 3.87–5.11)
Retic Count, Absolute: 46.4 10*3/uL (ref 19.0–186.0)
Retic Ct Pct: 1 % (ref 0.4–3.1)

## 2020-01-22 LAB — FOLATE: Folate: 8.9 ng/mL (ref >5.9)

## 2020-01-22 LAB — VITAMIN B12: Vitamin B-12: 181 pg/mL (ref 180–914)

## 2020-01-22 NOTE — Progress Notes (Signed)
Hematology/Oncology Consult note [facility] [phone] [phone]  Patient Care Team: Marguerita Merles, MD as PCP - General (Family Medicine)   Name of the patient: Alexis Nichols  [id number]  August 08, 1945    Reason for referral- abnormal weight loss/ anemia   Referring physician- Dr. Delight Stare  Date of visit: 01/22/20   History of presenting illness- Patient is a 74 year old female with prior longstanding history of smoking and alcohol intake and she quit about 10 years ago.  Prior to that she used to smoke 1 pack/day for about 40 years.  She has been referred to me for abnormal weight loss.  Patient reports she has a good appetite and despite that she has not been able to keep up with her weight.  Her weight last year in July was 117 pounds and is down 203 pounds now.  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  Patient also follows up with Dr. Smith Mince from nephrology for her CKD.  There was also concern for anemia and thrombocytopenia per Dr. Lennox Grumbles who is her PCP although I do not have her recent CBC for my review today.  Patient lives alone and is independent of her ADLs.  Other than weight loss she feels fine and does not have any specific complaints such as shortness of breath, chest pain or changes in her bowel habits.  She has never had a colonoscopy and reports that she underwent stool studies which were unremarkable.  She has not had a mammogram for a few years now  ECOG PS- 1  Pain scale- 0   Review of systems- Review of Systems  Constitutional: Positive for malaise/fatigue and weight loss. Negative for chills and fever.  HENT: Negative for congestion, ear discharge and nosebleeds.   Eyes: Negative for blurred vision.  Respiratory: Negative for cough, hemoptysis, sputum production, shortness of breath and wheezing.   Cardiovascular: Negative for chest pain, palpitations, orthopnea and claudication.    Gastrointestinal: Negative for abdominal pain, blood in stool, constipation, diarrhea, heartburn, melena, nausea and vomiting.  Genitourinary: Negative for dysuria, flank pain, frequency, hematuria and urgency.  Musculoskeletal: Negative for back pain, joint pain and myalgias.  Skin: Negative for rash.  Neurological: Negative for dizziness, tingling, focal weakness, seizures, weakness and headaches.  Endo/Heme/Allergies: Does not bruise/bleed easily.  Psychiatric/Behavioral: Negative for depression and suicidal ideas. The patient does not have insomnia.     No Known Allergies  Patient Active Problem List   Diagnosis Date Noted  . 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 Impaired functional mobility, balance, gait, and endurance 07/15/2019  . Fracture of tibial shaft, left, closed 07/14/2019  . Acute blood loss anemia 07/14/2019  . Vitamin D insufficiency 07/14/2019  . Osteoporosis   . HTN (hypertension)   . GERD (gastroesophageal reflux disease)   . CKD (chronic kidney disease)   . Tibial plateau fracture, left 07/11/2019     Past Medical History:  Diagnosis Date  . Acoustic neuroma ([place])   . CKD (chronic kidney disease)   . Fracture of tibial shaft, left, closed 07/14/2019  . GERD (gastroesophageal reflux disease)   . HTN (hypertension)   . Osteoporosis   . Vitamin D insufficiency 07/14/2019     Past Surgical History:  Procedure Laterality Date  . BRAIN TUMOR EXCISION  11/2018   excision of acoustic neuroma   . EXTERNAL FIXATION LEG Left 07/12/2019   Procedure: EXTERNAL FIXATION LEG;  Surgeon: Marchia Bond, MD;  Location:  [place] [facility];  Service: Orthopedics;  Laterality: Left;  . EXTERNAL FIXATION REMOVAL Left 07/15/2019   Procedure: Removal External Fixation Leg;  Surgeon: Altamese [place], MD;  Location: [place];  Service: Orthopedics;  Laterality: Left;  . ORIF TIBIA FRACTURE Left 07/15/2019   Procedure: OPEN REDUCTION INTERNAL FIXATION (ORIF) TIBIA FRACTURE;  Surgeon: Altamese Brule, MD;  Location: [place];  Service: Orthopedics;  Laterality: Left;  . ORIF TIBIA PLATEAU Left 07/15/2019   Procedure: OPEN REDUCTION INTERNAL FIXATION (ORIF) TIBIAL PLATEAU;  Surgeon: Altamese [place], MD;  Location: [place];  Service: Orthopedics;  Laterality: Left;    Social History   Socioeconomic History  . Marital status: Widowed    Spouse name: Not on file  . Number of children: Not on file  . Years of education: Not on file  . Highest education level: Not on file  Occupational History  . Not on file  Tobacco Use  . Smoking status: Never Smoker  . Smokeless tobacco: Never Used  Substance and Sexual Activity  . Alcohol use: Never  . Drug use: Not on file  . Sexual activity: Not on file  Other Topics Concern  . Not on file  Social History Narrative  . Not on file   Social Determinants of Health   Financial Resource Strain:   . Difficulty of Paying Living Expenses:   Food Insecurity:   . Worried About Charity fundraiser in the Last Year:   . Arboriculturist in the Last Year:   Transportation Needs:   . Film/video editor (Medical):   Marland Kitchen Lack of Transportation (Non-Medical):   Physical Activity:   . Days of Exercise per Week:   . Minutes of Exercise per Session:   Stress:   . Feeling of Stress :   Social Connections:   . Frequency of Communication with Friends and Family:   . Frequency of Social Gatherings with Friends and Family:   . Attends Religious Services:   . Active Member of Clubs or Organizations:   . Attends Archivist Meetings:   Marland Kitchen Marital Status:   Intimate Partner Violence:   . Fear of Current or Ex-Partner:   . Emotionally Abused:   Marland Kitchen Physically Abused:   . Sexually Abused:      History reviewed. No pertinent family history.   Current Outpatient Medications:  .  acetaminophen (TYLENOL) 500 MG tablet, Take 1 tablet (500 mg total) by mouth every 12 (twelve) hours., Disp: 30 tablet, Rfl: 0 .  docusate sodium (COLACE) 100 MG  capsule, Take 1 capsule (100 mg total) by mouth 2 (two) times daily., Disp: 10 capsule, Rfl: 0 .  feeding supplement, ENSURE ENLIVE, (ENSURE ENLIVE) LIQD, Take 237 mLs by mouth daily at 8 pm., Disp: 237 mL, Rfl: 12 .  metoprolol tartrate (LOPRESSOR) 25 MG tablet, Take 25 mg by mouth daily. , Disp: , Rfl:  .  omeprazole (PRILOSEC) 40 MG capsule, Take 40 mg by mouth daily., Disp: , Rfl:  .  solifenacin (VESICARE) 10 MG tablet, Take 10 mg by mouth daily. , Disp: , Rfl:  .  spironolactone (ALDACTONE) 50 MG tablet, Take 50 mg by mouth daily. , Disp: , Rfl:  .  methocarbamol (ROBAXIN) 500 MG tablet, Take 1 tablet (500 mg total) by mouth every 8 (eight) hours as needed for muscle spasms. (Patient not taking: Reported on 01/22/2020), Disp: 30 tablet, Rfl: 0   Physical exam:  Vitals:   01/22/20 1022  BP: (!) 118/53  Pulse: (!) 58  Resp: 18  Temp: 98 F (36.7 C)  TempSrc: Tympanic  SpO2: 99%  Weight: 103 lb 14.4 oz (47.1 kg)  Height: 5\' 2"  (1.575 m)   Physical Exam Constitutional:      Comments: Patient is thin and appears in no acute distress  Cardiovascular:     Rate and Rhythm: Normal rate and regular rhythm.     Heart sounds: Normal heart sounds.  Pulmonary:     Effort: Pulmonary effort is normal.     Breath sounds: Normal breath sounds.  Abdominal:     General: Bowel sounds are normal. There is no distension.     Palpations: Abdomen is soft.     Tenderness: There is no abdominal tenderness.     Comments: No palpable hepatosplenomegaly  Musculoskeletal:     Cervical back: Normal range of motion.  Lymphadenopathy:     Comments: No palpable cervical, supraclavicular, axillary or inguinal adenopathy   Skin:    General: Skin is warm and dry.  Neurological:     Mental Status: She is alert and oriented to person, place, and time.        CMP Latest Ref Rng & Units 01/22/2020  Glucose 70 - 99 mg/dL 113(H)  BUN 8 - 23 mg/dL 20  Creatinine 0.44 - 1.00 mg/dL 1.12(H)  Sodium 135 - 145  mmol/L 137  Potassium 3.5 - 5.1 mmol/L 4.2  Chloride 98 - 111 mmol/L 99  CO2 22 - 32 mmol/L 30  Calcium 8.9 - 10.3 mg/dL 9.8  Total Protein 6.5 - 8.1 g/dL 7.3  Total Bilirubin 0.3 - 1.2 mg/dL 0.6  Alkaline Phos 38 - 126 U/L 98  AST 15 - 41 U/L 22  ALT 0 - 44 U/L 17   CBC Latest Ref Rng & Units 01/22/2020  WBC 4.0 - 10.5 K/uL 9.6  Hemoglobin 12.0 - 15.0 g/dL 13.5  Hematocrit 36 - 46 % 39.2  Platelets 150 - 400 K/uL 261    Assessment and plan- Patient is a 74 y.o. female referred for abnormal weight loss and anemia  Normocytic anemia: The last lab that I had from our system was in January 2021 when her hemoglobin was 10.  I will do a comprehensive anemia work-up today including a CBC with differential, CMP, ferritin and iron studies, B12 and folate, TSH, haptoglobin, myeloma panel, LDH and serum free light chains.  Abnormal weight loss: Patient was an ex-smoker and has lost significant weight over 10 pounds in the last 6 months itself.  I would therefore like to proceed with CT chest abdomen and pelvis with contrast to rule out malignancy.  I will see her back in 2 weeks time to discuss the results of blood work and scans and further management   Thank you for this kind referral and the opportunity to participate in the care of this patient   Visit Diagnosis 1. Normocytic anemia   2. Abnormal weight loss   3. Jaundice     Dr. Randa Evens, MD, MPH [facility] at [facility] [phone] 01/22/2020 1:49 PM

## 2020-01-22 NOTE — Progress Notes (Signed)
New patient visit referred by Dr Lennox Grumbles for thrombocytopenia and macrocytic anemia.

## 2020-01-23 LAB — HAPTOGLOBIN: Haptoglobin: 183 mg/dL (ref 42–346)

## 2020-01-23 LAB — KAPPA/LAMBDA LIGHT CHAINS
Kappa free light chain: 45.8 mg/L — ABNORMAL HIGH (ref 3.3–19.4)
Kappa, lambda light chain ratio: 3.39 — ABNORMAL HIGH (ref 0.26–1.65)
Lambda free light chains: 13.5 mg/L (ref 5.7–26.3)

## 2020-01-24 LAB — MULTIPLE MYELOMA PANEL, SERUM
Albumin SerPl Elph-Mcnc: 3.8 g/dL (ref 2.9–4.4)
Albumin/Glob SerPl: 1.5 (ref 0.7–1.7)
Alpha 1: 0.2 g/dL (ref 0.0–0.4)
Alpha2 Glob SerPl Elph-Mcnc: 0.8 g/dL (ref 0.4–1.0)
B-Globulin SerPl Elph-Mcnc: 0.9 g/dL (ref 0.7–1.3)
Gamma Glob SerPl Elph-Mcnc: 0.7 g/dL (ref 0.4–1.8)
Globulin, Total: 2.6 g/dL (ref 2.2–3.9)
IgA: 257 mg/dL (ref 64–422)
IgG (Immunoglobin G), Serum: 746 mg/dL (ref 586–1602)
IgM (Immunoglobulin M), Srm: 89 mg/dL (ref 26–217)
Total Protein ELP: 6.4 g/dL (ref 6.0–8.5)

## 2020-01-28 ENCOUNTER — Telehealth: Payer: Self-pay | Admitting: *Deleted

## 2020-01-28 NOTE — Telephone Encounter (Signed)
Per [name] cx pt schedule 01/30/20 CT scan due to pending auth. 01/30/20 CT scan was cx as requested. Pt is aware.

## 2020-01-30 ENCOUNTER — Ambulatory Visit: Payer: Medicare HMO

## 2020-02-04 ENCOUNTER — Inpatient Hospital Stay: Payer: Medicare HMO | Admitting: Oncology

## 2020-02-06 ENCOUNTER — Ambulatory Visit
Admission: RE | Admit: 2020-02-06 | Discharge: 2020-02-06 | Disposition: A | Discharge status: Home / Self Care | Payer: Medicare HMO | Source: Ambulatory Visit | Attending: Oncology | Admitting: Oncology

## 2020-02-06 ENCOUNTER — Other Ambulatory Visit: Payer: Self-pay

## 2020-02-06 DIAGNOSIS — R918 Other nonspecific abnormal finding of lung field: Secondary | ICD-10-CM | POA: Insufficient documentation

## 2020-02-06 DIAGNOSIS — R17 Unspecified jaundice: Secondary | ICD-10-CM | POA: Insufficient documentation

## 2020-02-06 MED ORDER — IOHEXOL 300 MG/ML  SOLN
65.0000 mL | Freq: Once | INTRAMUSCULAR | Status: AC | PRN
  Administered 2020-02-06: 65 mL via INTRAVENOUS

## 2020-02-09 ENCOUNTER — Encounter: Payer: Self-pay | Admitting: Oncology

## 2020-02-09 ENCOUNTER — Other Ambulatory Visit: Payer: Self-pay

## 2020-02-09 ENCOUNTER — Inpatient Hospital Stay (HOSPITAL_BASED_OUTPATIENT_CLINIC_OR_DEPARTMENT_OTHER): Payer: Medicare HMO | Admitting: Oncology

## 2020-02-09 VITALS — BP 140/56 | HR 56 | Temp 97.5°F | Resp 16 | Wt 103.7 lb

## 2020-02-09 DIAGNOSIS — R634 Abnormal weight loss: Secondary | ICD-10-CM

## 2020-02-09 DIAGNOSIS — D649 Anemia, unspecified: Secondary | ICD-10-CM | POA: Diagnosis not present

## 2020-02-09 NOTE — Progress Notes (Signed)
Hematology/Oncology Consult note [facility]  [phone] Fax:([phone]  Patient Care Team: Marguerita Merles, MD as PCP - General (Family Medicine)   Name of the patient: Alexis Nichols  [id number]  24-Oct-1945   Date of visit: 02/09/20  Diagnosis-normocytic anemia likely secondary to B12 and iron deficiency  Chief complaint/ Reason for visit-discuss results of blood work  Heme/Onc history: Patient is a 74 year old female with prior longstanding history of smoking and alcohol intake and she quit about 10 years ago.  Prior to that she used to smoke 1 pack/day for about 40 years.  She has been referred to me for abnormal weight loss.  Patient reports she has a good appetite and despite that she has not been able to keep up with her weight.  Her weight last year in July was 117 pounds and is down 203 pounds now.  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  Patient also follows up with Dr. Smith Mince from nephrology for her CKD.  There was also concern for anemia and thrombocytopenia per Dr. Lennox Grumbles who is her PCP although I do not have her recent CBC for my review today.  Patient lives alone and is independent of her ADLs.  Other than weight loss she feels fine and does not have any specific complaints such as shortness of breath, chest pain or changes in her bowel habits.   CT chest abdomen pelvis with contrast on 02/06/2020 showed a 3 mm nonspecific left lower lobe lung nodule but no other evidence of malignancy.  Work-up for anemia showed that her hemoglobin was normal at 13.5/29.2 with a normal white count and platelets.  B12 levels were mildly low at 181.  Iron study showed a low ferritin of 20.  TSH, myeloma panel, reticulocyte count and LDH were normal.   Interval history- Weight has currently remained stable over the last 2 weeks.  Her daughter feels that she is probably not eating enough to meet her caloric needs.  Patient reports  mild fatigue but denies other complaints  ECOG PS- 1 Pain scale- 0   Review of systems- Review of Systems  Constitutional: Positive for malaise/fatigue and weight loss. Negative for chills and fever.  HENT: Negative for congestion, ear discharge and nosebleeds.   Eyes: Negative for blurred vision.  Respiratory: Negative for cough, hemoptysis, sputum production, shortness of breath and wheezing.   Cardiovascular: Negative for chest pain, palpitations, orthopnea and claudication.  Gastrointestinal: Negative for abdominal pain, blood in stool, constipation, diarrhea, heartburn, melena, nausea and vomiting.  Genitourinary: Negative for dysuria, flank pain, frequency, hematuria and urgency.  Musculoskeletal: Negative for back pain, joint pain and myalgias.  Skin: Negative for rash.  Neurological: Negative for dizziness, tingling, focal weakness, seizures, weakness and headaches.  Endo/Heme/Allergies: Does not bruise/bleed easily.  Psychiatric/Behavioral: Negative for depression and suicidal ideas. The patient does not have insomnia.        No Known Allergies   Past Medical History:  Diagnosis Date  . Acoustic neuroma ([place])   . CKD (chronic kidney disease)   . Fracture of tibial shaft, left, closed 07/14/2019  . GERD (gastroesophageal reflux disease)   . HTN (hypertension)   . Osteoporosis   . Vitamin D insufficiency 07/14/2019     Past Surgical History:  Procedure Laterality Date  . BRAIN TUMOR EXCISION  11/2018   excision of acoustic neuroma   . EXTERNAL FIXATION LEG Left 07/12/2019   Procedure: EXTERNAL FIXATION LEG;  Surgeon: Marchia Bond, MD;  Location: [place];  Service: Orthopedics;  Laterality: Left;  . EXTERNAL FIXATION REMOVAL Left 07/15/2019   Procedure: Removal External Fixation Leg;  Surgeon: Altamese Kenedy, MD;  Location: [place];  Service: Orthopedics;  Laterality: Left;  . ORIF TIBIA FRACTURE Left 07/15/2019   Procedure: OPEN REDUCTION INTERNAL FIXATION (ORIF)  TIBIA FRACTURE;  Surgeon: Altamese [place], MD;  Location: [place];  Service: Orthopedics;  Laterality: Left;  . ORIF TIBIA PLATEAU Left 07/15/2019   Procedure: OPEN REDUCTION INTERNAL FIXATION (ORIF) TIBIAL PLATEAU;  Surgeon: Altamese , MD;  Location: [place];  Service: Orthopedics;  Laterality: Left;    Social History   Socioeconomic History  . Marital status: Widowed    Spouse name: Not on file  . Number of children: Not on file  . Years of education: Not on file  . Highest education level: Not on file  Occupational History  . Not on file  Tobacco Use  . Smoking status: Never Smoker  . Smokeless tobacco: Never Used  Substance and Sexual Activity  . Alcohol use: Never  . Drug use: Not on file  . Sexual activity: Not on file  Other Topics Concern  . Not on file  Social History Narrative  . Not on file   Social Determinants of Health   Financial Resource Strain:   . Difficulty of Paying Living Expenses:   Food Insecurity:   . Worried About Charity fundraiser in the Last Year:   . Arboriculturist in the Last Year:   Transportation Needs:   . Film/video editor (Medical):   Marland Kitchen Lack of Transportation (Non-Medical):   Physical Activity:   . Days of Exercise per Week:   . Minutes of Exercise per Session:   Stress:   . Feeling of Stress :   Social Connections:   . Frequency of Communication with Friends and Family:   . Frequency of Social Gatherings with Friends and Family:   . Attends Religious Services:   . Active Member of Clubs or Organizations:   . Attends Archivist Meetings:   Marland Kitchen Marital Status:   Intimate Partner Violence:   . Fear of Current or Ex-Partner:   . Emotionally Abused:   Marland Kitchen Physically Abused:   . Sexually Abused:     No family history on file.   Current Outpatient Medications:  .  acetaminophen (TYLENOL) 500 MG tablet, Take 1 tablet (500 mg total) by mouth every 12 (twelve) hours., Disp: 30 tablet, Rfl: 0 .  feeding supplement,  ENSURE ENLIVE, (ENSURE ENLIVE) LIQD, Take 237 mLs by mouth daily at 8 pm., Disp: 237 mL, Rfl: 12 .  metoprolol tartrate (LOPRESSOR) 25 MG tablet, Take 25 mg by mouth daily. , Disp: , Rfl:  .  omeprazole (PRILOSEC) 40 MG capsule, Take 40 mg by mouth daily., Disp: , Rfl:  .  solifenacin (VESICARE) 10 MG tablet, Take 10 mg by mouth daily. , Disp: , Rfl:  .  spironolactone (ALDACTONE) 50 MG tablet, Take 50 mg by mouth daily. , Disp: , Rfl:  .  docusate sodium (COLACE) 100 MG capsule, Take 1 capsule (100 mg total) by mouth 2 (two) times daily. (Patient not taking: Reported on 02/09/2020), Disp: 10 capsule, Rfl: 0 .  methocarbamol (ROBAXIN) 500 MG tablet, Take 1 tablet (500 mg total) by mouth every 8 (eight) hours as needed for muscle spasms. (Patient not taking: Reported on 01/22/2020), Disp: 30 tablet, Rfl: 0  Physical exam:  Vitals:   02/09/20 1112  BP: (!) 140/56  Pulse: 56  Resp: 16  Temp: (!) 97.5 F (36.4 C)  TempSrc: Tympanic  SpO2: 100%  Weight: 103 lb 11.2 oz (47 kg)   Physical Exam Constitutional:      General: She is not in acute distress. Pulmonary:     Effort: Pulmonary effort is normal.  Skin:    General: Skin is warm and dry.  Neurological:     Mental Status: She is alert and oriented to person, place, and time.      CMP Latest Ref Rng & Units 01/22/2020  Glucose 70 - 99 mg/dL 113(H)  BUN 8 - 23 mg/dL 20  Creatinine 0.44 - 1.00 mg/dL 1.12(H)  Sodium 135 - 145 mmol/L 137  Potassium 3.5 - 5.1 mmol/L 4.2  Chloride 98 - 111 mmol/L 99  CO2 22 - 32 mmol/L 30  Calcium 8.9 - 10.3 mg/dL 9.8  Total Protein 6.5 - 8.1 g/dL 7.3  Total Bilirubin 0.3 - 1.2 mg/dL 0.6  Alkaline Phos 38 - 126 U/L 98  AST 15 - 41 U/L 22  ALT 0 - 44 U/L 17   CBC Latest Ref Rng & Units 01/22/2020  WBC 4.0 - 10.5 K/uL 9.6  Hemoglobin 12.0 - 15.0 g/dL 13.5  Hematocrit 36 - 46 % 39.2  Platelets 150 - 400 K/uL 261        CT CHEST ABDOMEN PELVIS W CONTRAST  Result Date: 02/06/2020 CLINICAL DATA:   Painless jaundice.  Weight loss. EXAM: CT CHEST, ABDOMEN, AND PELVIS WITH CONTRAST TECHNIQUE: Multidetector CT imaging of the chest, abdomen and pelvis was performed following the standard protocol during bolus administration of intravenous contrast. CONTRAST:  8mL OMNIPAQUE IOHEXOL 300 MG/ML  SOLN COMPARISON:  05/08/2009 FINDINGS: CT CHEST FINDINGS Cardiovascular: Heart size upper normal. Coronary artery calcification is evident. Atherosclerotic calcification is noted in the wall of the thoracic aorta. Mediastinum/Nodes: No mediastinal lymphadenopathy. There is no hilar lymphadenopathy. The esophagus has normal imaging features. There is no axillary lymphadenopathy. Lungs/Pleura: Centrilobular emphsyema noted. 3 mm left lower lobe nodule on 74/4 is new in the long interval since prior study. No overtly suspicious nodule or mass. No focal airspace consolidation. No pleural effusion. Musculoskeletal: No worrisome lytic or sclerotic osseous abnormality. Left shoulder subluxation with degenerative change is similar to prior. Compression fractures noted at T8, T9, and T10. CT ABDOMEN PELVIS FINDINGS Hepatobiliary: No suspicious focal abnormality within the liver parenchyma. Gallbladder is distended. No intrahepatic biliary duct dilatation. Extrahepatic common bile duct is nondilated measuring 4 mm diameter in the head of pancreas. Pancreas: No focal mass lesion. No dilatation of the main duct. No intraparenchymal cyst. No peripancreatic edema. Spleen: No splenomegaly. No focal mass lesion. Adrenals/Urinary Tract: No adrenal nodule or mass. Kidneys unremarkable. No evidence for hydroureter. The urinary bladder appears normal for the degree of distention. Stomach/Bowel: Stomach is unremarkable. No gastric wall thickening. No evidence of outlet obstruction. Duodenum is normally positioned as is the ligament of Treitz. No small bowel wall thickening. No small bowel dilatation. The terminal ileum is normal. The appendix is  normal. No gross colonic mass. No colonic wall thickening. Large stool volume. Vascular/Lymphatic: There is abdominal aortic atherosclerosis without aneurysm. There is no gastrohepatic or hepatoduodenal ligament lymphadenopathy. No retroperitoneal or mesenteric lymphadenopathy. No pelvic sidewall lymphadenopathy. Reproductive: The uterus is unremarkable.  There is no adnexal mass. Other: No intraperitoneal free fluid. Musculoskeletal: No worrisome lytic or sclerotic osseous abnormality. IMPRESSION: 1. No acute findings in the  chest, abdomen, or pelvis. Specifically, no findings to explain the patient's history of weight loss and jaundice. No biliary dilatation. 2. 3 mm left lower lobe pulmonary nodule, new in the long interval since prior study. No follow-up needed if patient is low-risk. Non-contrast chest CT can be considered in 12 months if patient is high-risk. This recommendation follows the consensus statement: Guidelines for Management of Incidental Pulmonary Nodules Detected on CT Images: From the Fleischner Society 2017; Radiology 2017; 284:228-243. 3. Large stool volume. Imaging features would be compatible with clinical constipation. 4. Compression fractures at T8, T9, and T10. 5. Aortic Atherosclerosis (ICD10-I70.0) and Emphysema (ICD10-J43.9). Electronically Signed   By: Misty Stanley M.D.   On: 02/06/2020 12:09     Assessment and plan- Patient is a 74 y.o. female with normocytic anemia and weight loss here to discuss results of blood work and CT scan  1.  Abnormal weight loss: Etiology unclear CT scans do not show any evidence of malignancy.  Defer further work-up to PCP.  It is possible the patient is not eating enough to meet her caloric needs and that needs to be reinforced.  2.  Normocytic anemia: On repeat check her hemoglobin had improved to 13.  Labs are suggestive of mild B12 deficiency with a B12 level of 181 and a component of iron deficiency as well given that her ferritin was 20.  I  have asked her to take oral B12 1000 mcg daily as well as over-the-counter iron pill every day or every other day as tolerated.  3.  Patient noted to have a nonspecific 3 mm left lower lobe lung nodule.  I will plan to follow that up with a repeat CT chest in 1 years time  Repeat CBC with differential, ferritin and iron studies and B12 in 3 months and then I will see her in person   Visit Diagnosis 1. Normocytic anemia   2. Abnormal weight loss      Dr. Randa Evens, MD, MPH [facility] [phone] 02/09/2020 1:43 PM

## 2020-05-11 ENCOUNTER — Encounter (INDEPENDENT_AMBULATORY_CARE_PROVIDER_SITE_OTHER): Payer: Self-pay

## 2020-05-11 ENCOUNTER — Inpatient Hospital Stay: Payer: Medicare HMO | Attending: Oncology | Admitting: Oncology

## 2020-05-11 ENCOUNTER — Inpatient Hospital Stay: Payer: Medicare HMO

## 2020-05-11 ENCOUNTER — Other Ambulatory Visit: Payer: Self-pay

## 2020-05-11 ENCOUNTER — Encounter: Payer: Self-pay | Admitting: Oncology

## 2020-05-11 ENCOUNTER — Other Ambulatory Visit: Payer: Medicare HMO

## 2020-05-11 VITALS — BP 131/52 | HR 56 | Temp 97.2°F | Resp 16 | Wt 108.9 lb

## 2020-05-11 DIAGNOSIS — M810 Age-related osteoporosis without current pathological fracture: Secondary | ICD-10-CM | POA: Insufficient documentation

## 2020-05-11 DIAGNOSIS — Z79899 Other long term (current) drug therapy: Secondary | ICD-10-CM | POA: Insufficient documentation

## 2020-05-11 DIAGNOSIS — D649 Anemia, unspecified: Secondary | ICD-10-CM | POA: Diagnosis not present

## 2020-05-11 DIAGNOSIS — I129 Hypertensive chronic kidney disease with stage 1 through stage 4 chronic kidney disease, or unspecified chronic kidney disease: Secondary | ICD-10-CM | POA: Insufficient documentation

## 2020-05-11 DIAGNOSIS — R634 Abnormal weight loss: Secondary | ICD-10-CM | POA: Insufficient documentation

## 2020-05-11 DIAGNOSIS — N189 Chronic kidney disease, unspecified: Secondary | ICD-10-CM | POA: Insufficient documentation

## 2020-05-11 DIAGNOSIS — D696 Thrombocytopenia, unspecified: Secondary | ICD-10-CM | POA: Diagnosis not present

## 2020-05-11 DIAGNOSIS — K219 Gastro-esophageal reflux disease without esophagitis: Secondary | ICD-10-CM | POA: Diagnosis not present

## 2020-05-11 DIAGNOSIS — Z87891 Personal history of nicotine dependence: Secondary | ICD-10-CM | POA: Insufficient documentation

## 2020-05-11 DIAGNOSIS — E871 Hypo-osmolality and hyponatremia: Secondary | ICD-10-CM | POA: Insufficient documentation

## 2020-05-11 DIAGNOSIS — Z86018 Personal history of other benign neoplasm: Secondary | ICD-10-CM | POA: Diagnosis not present

## 2020-05-11 LAB — CBC WITH DIFFERENTIAL/PLATELET
Abs Immature Granulocytes: 0.04 10*3/uL (ref 0.00–0.07)
Basophils Absolute: 0.1 10*3/uL (ref 0.0–0.1)
Basophils Relative: 1 %
Eosinophils Absolute: 0.1 10*3/uL (ref 0.0–0.5)
Eosinophils Relative: 1 %
HCT: 42.7 % (ref 36.0–46.0)
Hemoglobin: 14.3 g/dL (ref 12.0–15.0)
Immature Granulocytes: 1 %
Lymphocytes Relative: 17 %
Lymphs Abs: 1.5 10*3/uL (ref 0.7–4.0)
MCH: 31 pg (ref 26.0–34.0)
MCHC: 33.5 g/dL (ref 30.0–36.0)
MCV: 92.4 fL (ref 80.0–100.0)
Monocytes Absolute: 0.5 10*3/uL (ref 0.1–1.0)
Monocytes Relative: 6 %
Neutro Abs: 6.4 10*3/uL (ref 1.7–7.7)
Neutrophils Relative %: 74 %
Platelets: 255 10*3/uL (ref 150–400)
RBC: 4.62 MIL/uL (ref 3.87–5.11)
RDW: 13 % (ref 11.5–15.5)
WBC: 8.6 10*3/uL (ref 4.0–10.5)
nRBC: 0 % (ref 0.0–0.2)

## 2020-05-11 LAB — IRON AND TIBC
Iron: 78 ug/dL (ref 28–170)
Saturation Ratios: 21 % (ref 10.4–31.8)
TIBC: 367 ug/dL (ref 250–450)
UIBC: 289 ug/dL

## 2020-05-11 LAB — VITAMIN B12: Vitamin B-12: 957 pg/mL — ABNORMAL HIGH (ref 180–914)

## 2020-05-11 LAB — FERRITIN: Ferritin: 14 ng/mL (ref 11–307)

## 2020-05-14 NOTE — Progress Notes (Signed)
Hematology/Oncology Consult note [facility]  Telephone:([phone] [phone]  Patient Care Team: Marguerita Merles, MD as PCP - General (Family Medicine)   Name of the patient: Alexis Nichols  [id number]  [birth year]   Date of visit: 05/14/20  Diagnosis- normocytic anemia likely secondary to B12 and iron deficiency  Chief complaint/ Reason for visit-routine follow-up of anemia  Heme/Onc history: Patientis a 74 year old female with prior longstanding history of smoking and alcohol intake and she quit about 10 years ago. Prior to that she used to smoke 1 pack/day for about 40 years. She has been referred to me for abnormal weight loss. Patient reports she has a good appetite and despite that she has not been able to keep up with her weight. Her weight last year in July was 117 pounds and is down 203 pounds now. 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 Patient also follows up with Dr. Madelaine Etienne nephrology for her CKD. There was also concern for anemia and thrombocytopenia per Dr. Lennox Grumbles who is her PCP although I do not have her recent CBC for my review today. Patient lives alone and is independent of her ADLs. Other than weight loss she feels fine and does not have any specific complaints such as shortness of breath, chest pain or changes in her bowel habits.   CT chest abdomen pelvis with contrast on 02/06/2020 showed a 3 mm nonspecific left lower lobe lung nodule but no other evidence of malignancy.  Work-up for anemia showed that her hemoglobin was normal at 13.5/29.2 with a normal white count and platelets.  B12 levels were mildly low at 181.  Iron study showed a low ferritin of 20.  TSH, myeloma panel, reticulocyte count and LDH were normal.   Interval history-patient's weight has stabilized and she has gained 5 pounds in the last 3 months.  Reports that she is eating better and also drinking protein drinks.  ECOG PS-  1 Pain scale- 0  Review of systems- Review of Systems  Constitutional: Positive for malaise/fatigue. Negative for chills, fever and weight loss.  HENT: Negative for congestion, ear discharge and nosebleeds.   Eyes: Negative for blurred vision.  Respiratory: Negative for cough, hemoptysis, sputum production, shortness of breath and wheezing.   Cardiovascular: Negative for chest pain, palpitations, orthopnea and claudication.  Gastrointestinal: Negative for abdominal pain, blood in stool, constipation, diarrhea, heartburn, melena, nausea and vomiting.  Genitourinary: Negative for dysuria, flank pain, frequency, hematuria and urgency.  Musculoskeletal: Negative for back pain, joint pain and myalgias.  Skin: Negative for rash.  Neurological: Negative for dizziness, tingling, focal weakness, seizures, weakness and headaches.  Endo/Heme/Allergies: Does not bruise/bleed easily.  Psychiatric/Behavioral: Negative for depression and suicidal ideas. The patient does not have insomnia.       No Known Allergies   Past Medical History:  Diagnosis Date  . Acoustic neuroma ([place])   . CKD (chronic kidney disease)   . Fracture of tibial shaft, left, closed 07/14/2019  . GERD (gastroesophageal reflux disease)   . HTN (hypertension)   . Osteoporosis   . Vitamin D insufficiency 07/14/2019     Past Surgical History:  Procedure Laterality Date  . BRAIN TUMOR EXCISION  11/2018   excision of acoustic neuroma   . EXTERNAL FIXATION LEG Left 07/12/2019   Procedure: EXTERNAL FIXATION LEG;  Surgeon: Marchia Bond, MD;  Location: [place];  Service: Orthopedics;  Laterality: Left;  . EXTERNAL FIXATION REMOVAL Left 07/15/2019  Procedure: Removal External Fixation Leg;  Surgeon: Altamese [place], MD;  Location: [place];  Service: Orthopedics;  Laterality: Left;  . ORIF TIBIA FRACTURE Left 07/15/2019   Procedure: OPEN REDUCTION INTERNAL FIXATION (ORIF) TIBIA FRACTURE;  Surgeon: Altamese [place], MD;  Location: [place];  Service: Orthopedics;  Laterality: Left;  . ORIF TIBIA PLATEAU Left 07/15/2019   Procedure: OPEN REDUCTION INTERNAL FIXATION (ORIF) TIBIAL PLATEAU;  Surgeon: Altamese , MD;  Location: [place];  Service: Orthopedics;  Laterality: Left;    Social History   Socioeconomic History  . Marital status: Widowed    Spouse name: Not on file  . Number of children: Not on file  . Years of education: Not on file  . Highest education level: Not on file  Occupational History  . Not on file  Tobacco Use  . Smoking status: Never Smoker  . Smokeless tobacco: Never Used  Substance and Sexual Activity  . Alcohol use: Never  . Drug use: Not on file  . Sexual activity: Not on file  Other Topics Concern  . Not on file  Social History Narrative  . Not on file   Social Determinants of Health   Financial Resource Strain:   . Difficulty of Paying Living Expenses: Not on file  Food Insecurity:   . Worried About Charity fundraiser in the Last Year: Not on file  . Ran Out of Food in the Last Year: Not on file  Transportation Needs:   . Lack of Transportation (Medical): Not on file  . Lack of Transportation (Non-Medical): Not on file  Physical Activity:   . Days of Exercise per Week: Not on file  . Minutes of Exercise per Session: Not on file  Stress:   . Feeling of Stress : Not on file  Social Connections:   . Frequency of Communication with Friends and Family: Not on file  . Frequency of Social Gatherings with Friends and Family: Not on file  . Attends Religious Services: Not on file  . Active Member of Clubs or Organizations: Not on file  . Attends Archivist Meetings: Not on file  . Marital Status: Not on file  Intimate Partner Violence:   . Fear of Current or Ex-Partner: Not on file  . Emotionally Abused: Not on file  . Physically Abused: Not on file  . Sexually Abused: Not on file    No family history on file.   Current Outpatient Medications:  .  acetaminophen  (TYLENOL) 500 MG tablet, Take 1 tablet (500 mg total) by mouth every 12 (twelve) hours., Disp: 30 tablet, Rfl: 0 .  feeding supplement, ENSURE ENLIVE, (ENSURE ENLIVE) LIQD, Take 237 mLs by mouth daily at 8 pm., Disp: 237 mL, Rfl: 12 .  metoprolol tartrate (LOPRESSOR) 25 MG tablet, Take 25 mg by mouth daily. , Disp: , Rfl:  .  omeprazole (PRILOSEC) 40 MG capsule, Take 40 mg by mouth daily., Disp: , Rfl:  .  solifenacin (VESICARE) 10 MG tablet, Take 10 mg by mouth daily. , Disp: , Rfl:  .  spironolactone (ALDACTONE) 50 MG tablet, Take 50 mg by mouth daily. , Disp: , Rfl:  .  vitamin B-12 (CYANOCOBALAMIN) 1000 MCG tablet, Take 1,000 mcg by mouth daily., Disp: , Rfl:  .  docusate sodium (COLACE) 100 MG capsule, Take 1 capsule (100 mg total) by mouth 2 (two) times daily. (Patient not taking: Reported on 02/09/2020), Disp: 10 capsule, Rfl: 0  Physical exam:  Vitals:   05/11/20  1027  BP: (!) 131/52  Pulse: (!) 56  Resp: 16  Temp: (!) 97.2 F (36.2 C)  TempSrc: Tympanic  SpO2: 100%  Weight: 108 lb 14.4 oz (49.4 kg)   Physical Exam Constitutional:      General: She is not in acute distress. Cardiovascular:     Rate and Rhythm: Normal rate and regular rhythm.     Heart sounds: Normal heart sounds.  Pulmonary:     Effort: Pulmonary effort is normal.     Breath sounds: Normal breath sounds.  Abdominal:     General: Bowel sounds are normal.     Palpations: Abdomen is soft.  Skin:    General: Skin is warm and dry.  Neurological:     Mental Status: She is alert and oriented to person, place, and time.      CMP Latest Ref Rng & Units 01/22/2020  Glucose 70 - 99 mg/dL 113(H)  BUN 8 - 23 mg/dL 20  Creatinine 0.44 - 1.00 mg/dL 1.12(H)  Sodium 135 - 145 mmol/L 137  Potassium 3.5 - 5.1 mmol/L 4.2  Chloride 98 - 111 mmol/L 99  CO2 22 - 32 mmol/L 30  Calcium 8.9 - 10.3 mg/dL 9.8  Total Protein 6.5 - 8.1 g/dL 7.3  Total Bilirubin 0.3 - 1.2 mg/dL 0.6  Alkaline Phos 38 - 126 U/L 98  AST 15 -  41 U/L 22  ALT 0 - 44 U/L 17   CBC Latest Ref Rng & Units 05/11/2020  WBC 4.0 - 10.5 K/uL 8.6  Hemoglobin 12.0 - 15.0 g/dL 14.3  Hematocrit 36 - 46 % 42.7  Platelets 150 - 400 K/uL 255      Assessment and plan- Patient is a 74 y.o. female here for routine follow-up of anemia and ongoing weight loss  1. Normocytic anemia:Patient's hemoglobin is currently normal at 14.3.  Ferritin levels are low at 14 but given that she is not anemic at this time I will hold off on giving her IV iron.  Iron studies were otherwise normal.  B12 levels were elevated at 957.  2.  Weight loss: This seems to have stabilized at this time and she has gained 5 pounds in the last 3 months and eating better as well as taking protein drinks.  Continue to monitor I will see her back in 4 months with CBC ferritin iron studies and B12   Visit Diagnosis 1. Normocytic anemia      Dr. Randa Evens, MD, MPH [facility] [phone] 05/14/2020 12:25 PM

## 2020-09-14 ENCOUNTER — Telehealth: Payer: Self-pay | Admitting: Oncology

## 2020-09-14 ENCOUNTER — Inpatient Hospital Stay: Payer: Medicare HMO

## 2020-09-14 ENCOUNTER — Inpatient Hospital Stay: Payer: Medicare HMO | Admitting: Oncology

## 2020-09-14 NOTE — Telephone Encounter (Signed)
Left VM for pt to please call back and r/s today's missed appt.

## 2020-10-19 ENCOUNTER — Inpatient Hospital Stay: Payer: Medicare HMO | Attending: Oncology

## 2020-10-19 ENCOUNTER — Inpatient Hospital Stay (HOSPITAL_BASED_OUTPATIENT_CLINIC_OR_DEPARTMENT_OTHER): Payer: Medicare HMO | Admitting: Oncology

## 2020-10-19 VITALS — BP 119/60 | HR 98 | Temp 97.7°F | Resp 18 | Wt 108.2 lb

## 2020-10-19 DIAGNOSIS — D509 Iron deficiency anemia, unspecified: Secondary | ICD-10-CM

## 2020-10-19 DIAGNOSIS — D649 Anemia, unspecified: Secondary | ICD-10-CM

## 2020-10-19 LAB — CBC WITH DIFFERENTIAL/PLATELET
Abs Immature Granulocytes: 0.03 10*3/uL (ref 0.00–0.07)
Basophils Absolute: 0.1 10*3/uL (ref 0.0–0.1)
Basophils Relative: 1 %
Eosinophils Absolute: 0.1 10*3/uL (ref 0.0–0.5)
Eosinophils Relative: 1 %
HCT: 44.9 % (ref 36.0–46.0)
Hemoglobin: 14.8 g/dL (ref 12.0–15.0)
Immature Granulocytes: 0 %
Lymphocytes Relative: 18 %
Lymphs Abs: 1.6 10*3/uL (ref 0.7–4.0)
MCH: 30.5 pg (ref 26.0–34.0)
MCHC: 33 g/dL (ref 30.0–36.0)
MCV: 92.6 fL (ref 80.0–100.0)
Monocytes Absolute: 0.6 10*3/uL (ref 0.1–1.0)
Monocytes Relative: 6 %
Neutro Abs: 6.6 10*3/uL (ref 1.7–7.7)
Neutrophils Relative %: 74 %
Platelets: 274 10*3/uL (ref 150–400)
RBC: 4.85 MIL/uL (ref 3.87–5.11)
RDW: 12.7 % (ref 11.5–15.5)
WBC: 8.9 10*3/uL (ref 4.0–10.5)
nRBC: 0 % (ref 0.0–0.2)

## 2020-10-19 LAB — IRON AND TIBC
Iron: 150 ug/dL (ref 28–170)
Saturation Ratios: 40 % — ABNORMAL HIGH (ref 10.4–31.8)
TIBC: 375 ug/dL (ref 250–450)
UIBC: 225 ug/dL

## 2020-10-19 LAB — FERRITIN: Ferritin: 20 ng/mL (ref 11–307)

## 2020-10-19 LAB — VITAMIN B12: Vitamin B-12: 1129 pg/mL — ABNORMAL HIGH (ref 180–914)

## 2020-10-20 NOTE — Progress Notes (Signed)
Hematology/Oncology Consult note [facility]  Telephone:([phone] [phone]  Patient Care Team: Marguerita Merles, MD as PCP - General (Family Medicine)   Name of the patient: Alexis Nichols  [id number]  04-21-1946   Date of visit: 10/20/20  Diagnosis- normocytic anemia likely secondary to B12 and iron deficiency  Chief complaint/ Reason for visit-routine follow-up of anemia  Heme/Onc history: Patientis a 75 year old female with prior longstanding history of smoking and alcohol intake and she quit about 10 years ago. Prior to that she used to smoke 1 pack/day for about 40 years. She has been referred to me for abnormal weight loss. Patient reports she has a good appetite and despite that she has not been able to keep up with her weight. Her weight last year in July was 117 pounds and is down 203 pounds now. 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 Patient also follows up with Dr. Madelaine Etienne nephrology for her CKD. There was also concern for anemia and thrombocytopenia per Dr. Lennox Grumbles who is her PCP although I do not have her recent CBC for my review today. Patient lives alone and is independent of her ADLs. Other than weight loss she feels fine and does not have any specific complaints such as shortness of breath, chest pain or changes in her bowel habits.  CT chest abdomen pelvis with contrast on 02/06/2020 showed a 3 mm nonspecific left lower lobe lung nodule but no other evidence of malignancy. Work-up for anemia showed that her hemoglobin was normal at 13.5/29.2 with a normal white count and platelets. B12 levels were mildly low at 181. Iron study showed a low ferritin of 20. TSH, myeloma panel, reticulocyte count and LDH were normal.  Interval history-patient reports that she feels at her baseline state of health.  She has not had any recent hospitalizations.  Has mild fatigue.  Denies any blood loss in her stool or  urine.  Weight has now stabilized.  ECOG PS- 1 Pain scale- 0   Review of systems- Review of Systems  Constitutional: Positive for malaise/fatigue. Negative for chills, fever and weight loss.  HENT: Negative for congestion, ear discharge and nosebleeds.   Eyes: Negative for blurred vision.  Respiratory: Negative for cough, hemoptysis, sputum production, shortness of breath and wheezing.   Cardiovascular: Negative for chest pain, palpitations, orthopnea and claudication.  Gastrointestinal: Negative for abdominal pain, blood in stool, constipation, diarrhea, heartburn, melena, nausea and vomiting.  Genitourinary: Negative for dysuria, flank pain, frequency, hematuria and urgency.  Musculoskeletal: Negative for back pain, joint pain and myalgias.  Skin: Negative for rash.  Neurological: Negative for dizziness, tingling, focal weakness, seizures, weakness and headaches.  Endo/Heme/Allergies: Does not bruise/bleed easily.  Psychiatric/Behavioral: Negative for depression and suicidal ideas. The patient does not have insomnia.       No Known Allergies   Past Medical History:  Diagnosis Date  . Acoustic neuroma ([place])   . CKD (chronic kidney disease)   . Fracture of tibial shaft, left, closed 07/14/2019  . GERD (gastroesophageal reflux disease)   . HTN (hypertension)   . Osteoporosis   . Vitamin D insufficiency 07/14/2019     Past Surgical History:  Procedure Laterality Date  . BRAIN TUMOR EXCISION  11/2018   excision of acoustic neuroma   . EXTERNAL FIXATION LEG Left 07/12/2019   Procedure: EXTERNAL FIXATION LEG;  Surgeon: Marchia Bond, MD;  Location: [place];  Service: Orthopedics;  Laterality: Left;  .  EXTERNAL FIXATION REMOVAL Left 07/15/2019   Procedure: Removal External Fixation Leg;  Surgeon: Altamese [place]den, MD;  Location: [place];  Service: Orthopedics;  Laterality: Left;  . ORIF TIBIA FRACTURE Left 07/15/2019   Procedure: OPEN REDUCTION INTERNAL FIXATION (ORIF) TIBIA  FRACTURE;  Surgeon: Altamese Bussey, MD;  Location: [place];  Service: Orthopedics;  Laterality: Left;  . ORIF TIBIA PLATEAU Left 07/15/2019   Procedure: OPEN REDUCTION INTERNAL FIXATION (ORIF) TIBIAL PLATEAU;  Surgeon: Altamese [place], MD;  Location: [place];  Service: Orthopedics;  Laterality: Left;    Social History   Socioeconomic History  . Marital status: Widowed    Spouse name: Not on file  . Number of children: Not on file  . Years of education: Not on file  . Highest education level: Not on file  Occupational History  . Not on file  Tobacco Use  . Smoking status: Never Smoker  . Smokeless tobacco: Never Used  Substance and Sexual Activity  . Alcohol use: Never  . Drug use: Not on file  . Sexual activity: Not on file  Other Topics Concern  . Not on file  Social History Narrative  . Not on file   Social Determinants of Health   Financial Resource Strain: Not on file  Food Insecurity: Not on file  Transportation Needs: Not on file  Physical Activity: Not on file  Stress: Not on file  Social Connections: Not on file  Intimate Partner Violence: Not on file    No family history on file.   Current Outpatient Medications:  .  acetaminophen (TYLENOL) 500 MG tablet, Take 1 tablet (500 mg total) by mouth every 12 (twelve) hours., Disp: 30 tablet, Rfl: 0 .  feeding supplement, ENSURE ENLIVE, (ENSURE ENLIVE) LIQD, Take 237 mLs by mouth daily at 8 pm., Disp: 237 mL, Rfl: 12 .  metoprolol tartrate (LOPRESSOR) 25 MG tablet, Take 25 mg by mouth daily. , Disp: , Rfl:  .  omeprazole (PRILOSEC) 40 MG capsule, Take 40 mg by mouth daily., Disp: , Rfl:  .  solifenacin (VESICARE) 10 MG tablet, Take 10 mg by mouth daily. , Disp: , Rfl:  .  spironolactone (ALDACTONE) 50 MG tablet, Take 50 mg by mouth daily. , Disp: , Rfl:  .  vitamin B-12 (CYANOCOBALAMIN) 1000 MCG tablet, Take 1,000 mcg by mouth daily., Disp: , Rfl:  .  docusate sodium (COLACE) 100 MG capsule, Take 1 capsule (100 mg total)  by mouth 2 (two) times daily. (Patient not taking: No sig reported), Disp: 10 capsule, Rfl: 0  Physical exam:  Vitals:   10/19/20 1202  BP: 119/60  Pulse: 98  Resp: 18  Temp: 97.7 F (36.5 C)  TempSrc: Tympanic  SpO2: 100%  Weight: 108 lb 3.2 oz (49.1 kg)   Physical Exam HENT:     Head: Normocephalic and atraumatic.  Eyes:     Pupils: Pupils are equal, round, and reactive to light.  Cardiovascular:     Rate and Rhythm: Normal rate and regular rhythm.     Heart sounds: Normal heart sounds.  Pulmonary:     Effort: Pulmonary effort is normal.     Breath sounds: Normal breath sounds.  Abdominal:     General: Bowel sounds are normal.     Palpations: Abdomen is soft.  Musculoskeletal:     Cervical back: Normal range of motion.  Skin:    General: Skin is warm and dry.  Neurological:     Mental Status: She is alert and oriented  to person, place, and time.      CMP Latest Ref Rng & Units 01/22/2020  Glucose 70 - 99 mg/dL 113(H)  BUN 8 - 23 mg/dL 20  Creatinine 0.44 - 1.00 mg/dL 1.12(H)  Sodium 135 - 145 mmol/L 137  Potassium 3.5 - 5.1 mmol/L 4.2  Chloride 98 - 111 mmol/L 99  CO2 22 - 32 mmol/L 30  Calcium 8.9 - 10.3 mg/dL 9.8  Total Protein 6.5 - 8.1 g/dL 7.3  Total Bilirubin 0.3 - 1.2 mg/dL 0.6  Alkaline Phos 38 - 126 U/L 98  AST 15 - 41 U/L 22  ALT 0 - 44 U/L 17   CBC Latest Ref Rng & Units 10/19/2020  WBC 4.0 - 10.5 K/uL 8.9  Hemoglobin 12.0 - 15.0 g/dL 14.8  Hematocrit 36.0 - 46.0 % 44.9  Platelets 150 - 400 K/uL 274      Assessment and plan- Patient is a 75 y.o. female here for routine follow-up of normocytic anemia  Patient's hemoglobin is remained stable between 13-14 for the last 8 months.  She has not required any IV iron.  Her iron studies presently show a mildly low ferritin of 20 with normal iron studies.  B12 level at 1129.  She does not require any IV iron at this time.  She can continue to follow-up with Dr. Vaughan Basta mild and can be referred to [place] in the  future if her anemia worsens.  Patient can continue to take oral iron at this time as tolerated.  She would need iron studies checked by her primary care doctor every 6 months   Visit Diagnosis 1. Iron deficiency anemia, unspecified iron deficiency anemia type      Dr. Randa Evens, MD, MPH [facility] [phone] 10/20/2020 11:23 AM

## 2020-11-27 IMAGING — CT CT TIBIA FIBULA *L* W/O CM
2 of 3 series · 11 of 33 positions shown, 13 images · non-contrast
Comparison: Radiographs dated 07/11/2019

CLINICAL DATA: Fractures of the left tibia and fibula.

EXAM:
CT OF THE LOWER LEFT EXTREMITY WITHOUT CONTRAST
TECHNIQUE: Multidetector CT imaging of the lower left extremity was performed
according to the standard protocol.

[Series 8: coronal st · coronal · 0.27mm/px · 3 of 64 slices shown]
[im 13/64  bone]
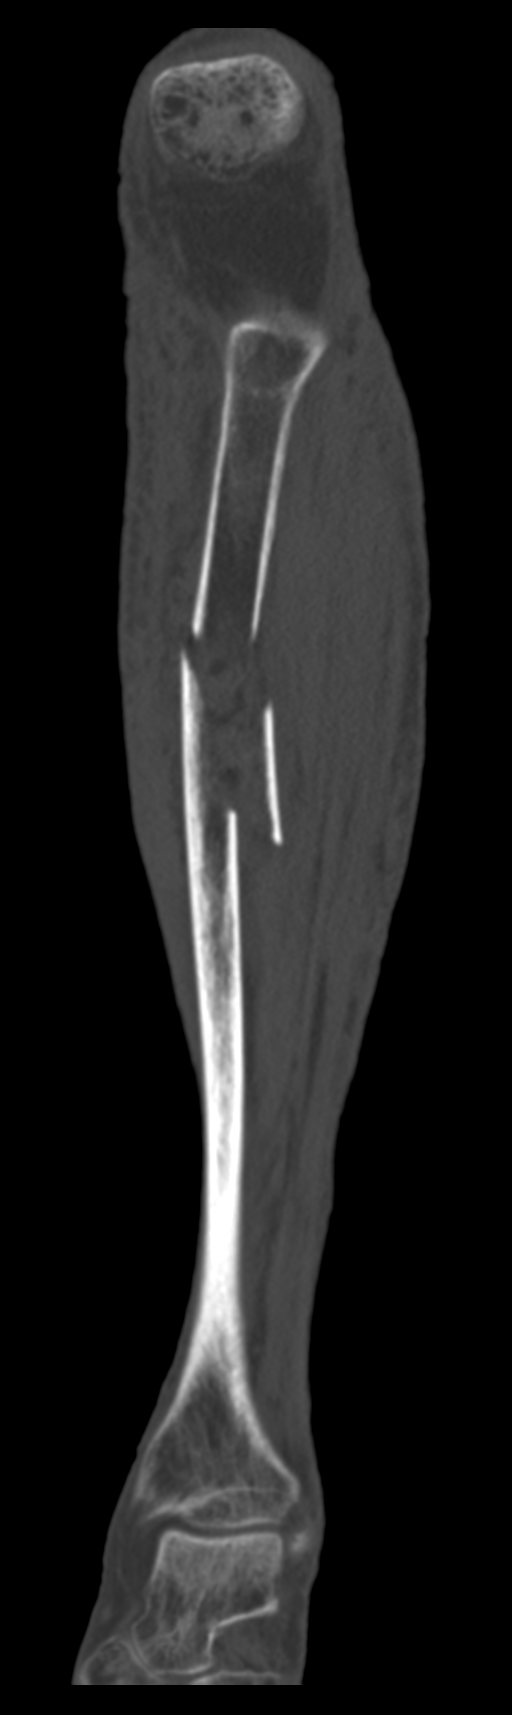
[im 26/64  bone]
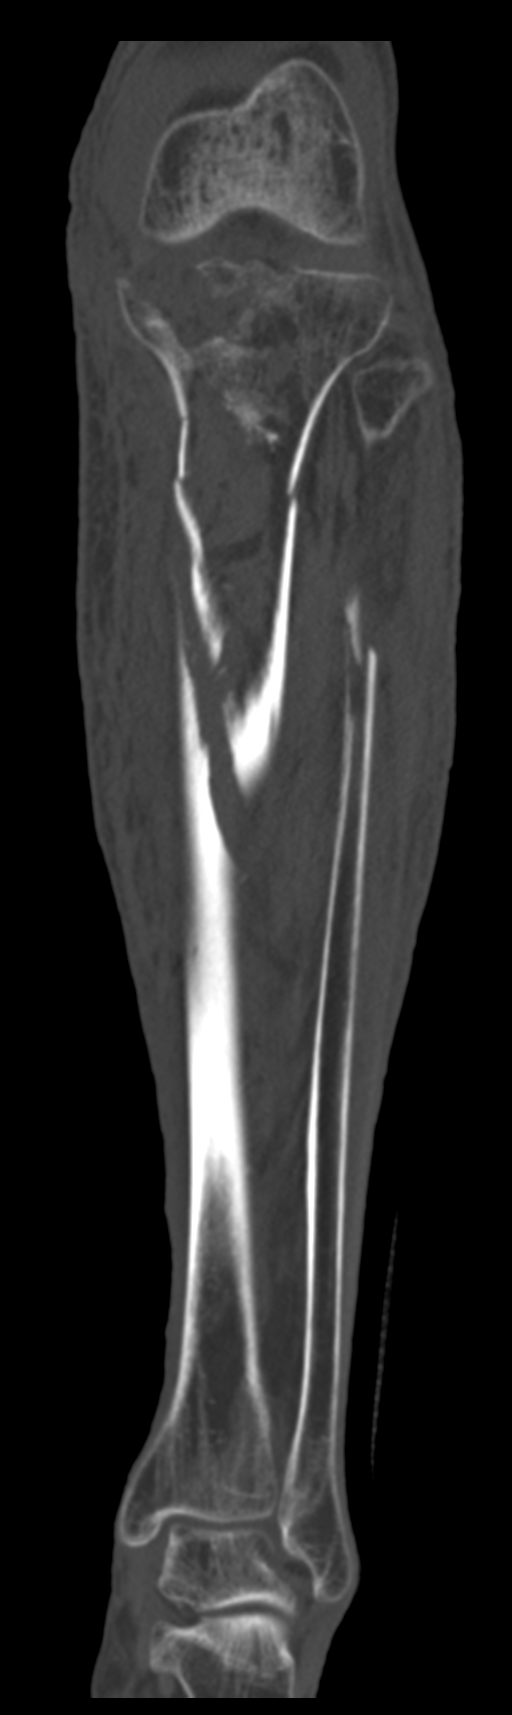
[im 38/64  bone]
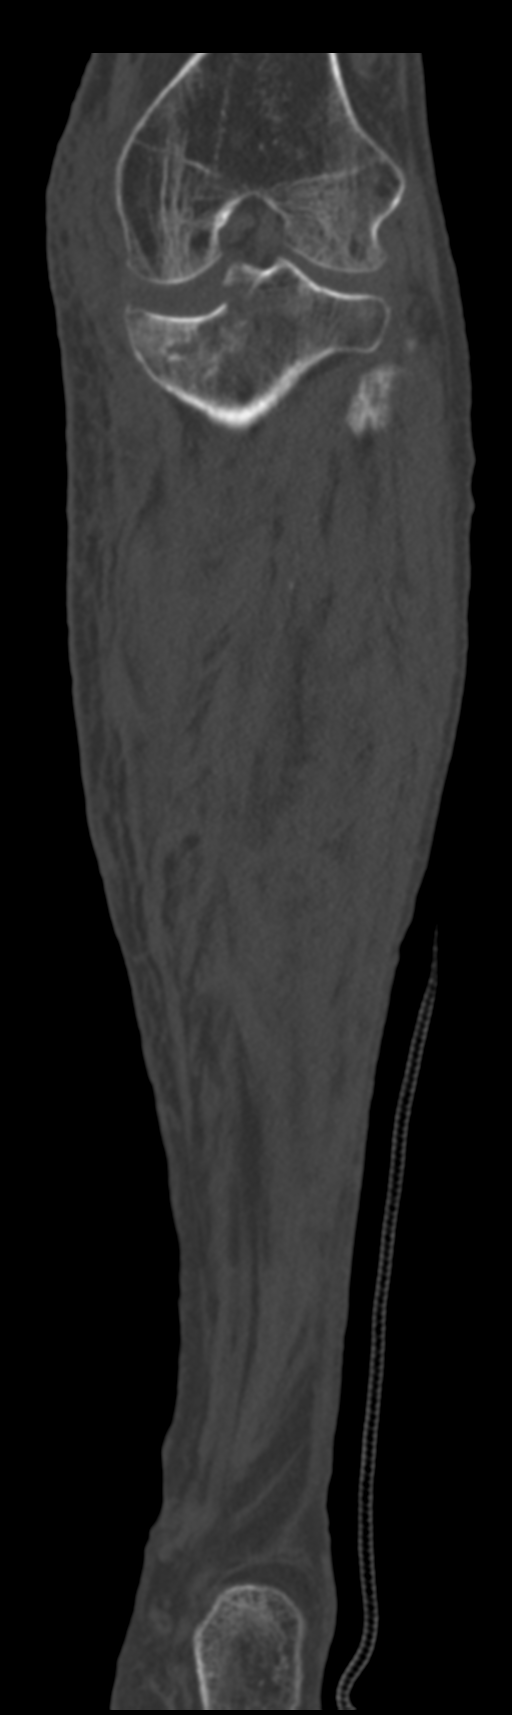

[Series 11: axial st · axial · 0.38mm/px · z∈[-306,+61]mm · 8 of 291 slices shown, 10 images]
[im 23/291  soft-tissue]
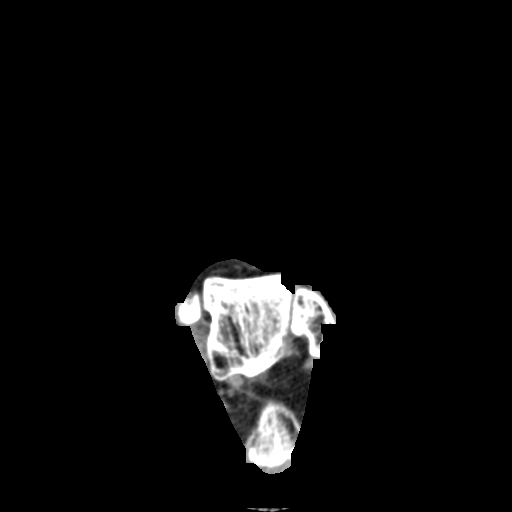
[im 23/291  bone]
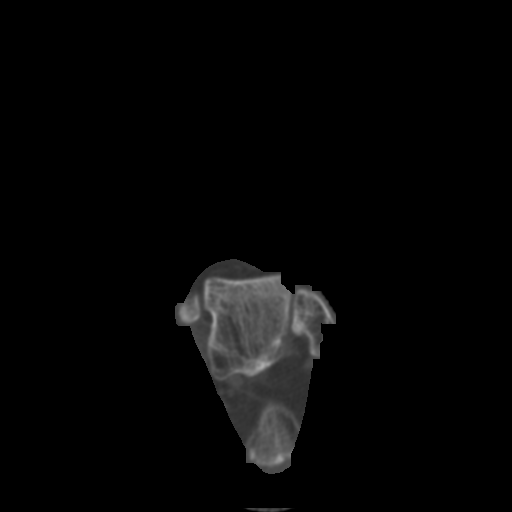
[im 67/291  bone]
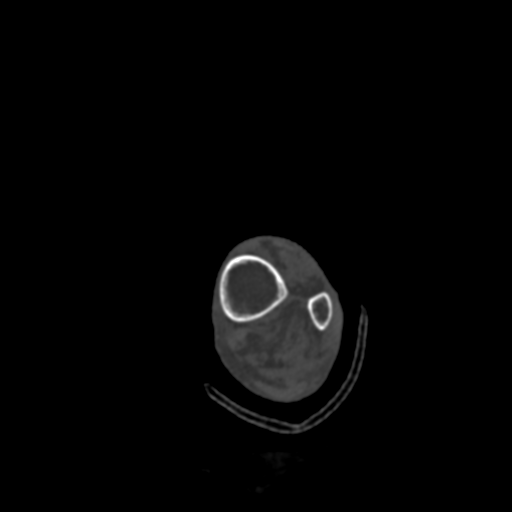
[im 90/291  bone]
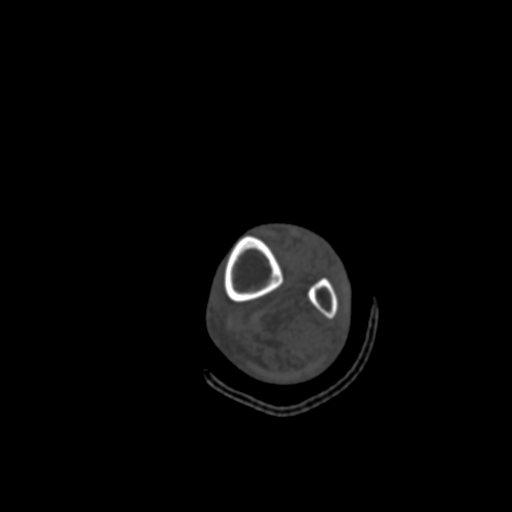
[im 134/291  bone]
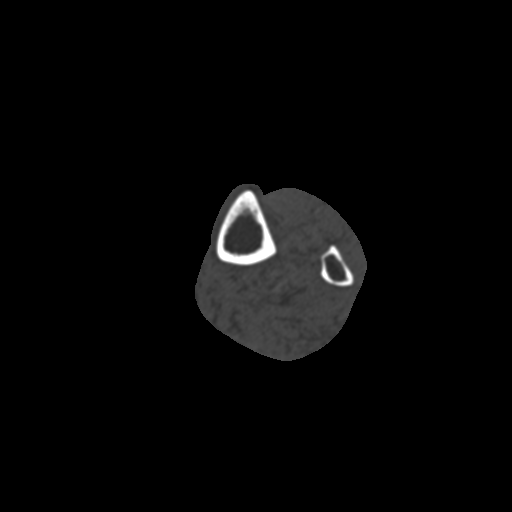
[im 157/291  soft-tissue]
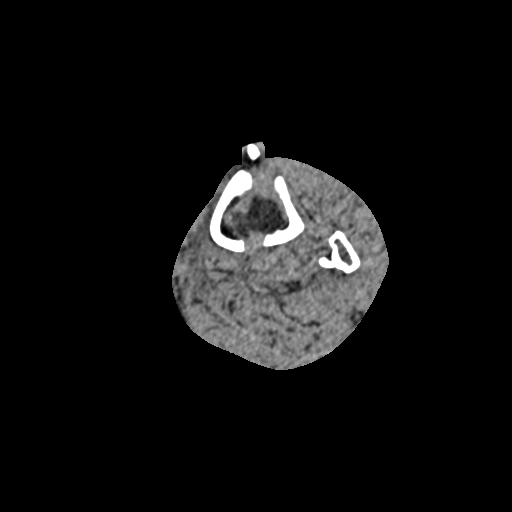
[im 157/291  bone]
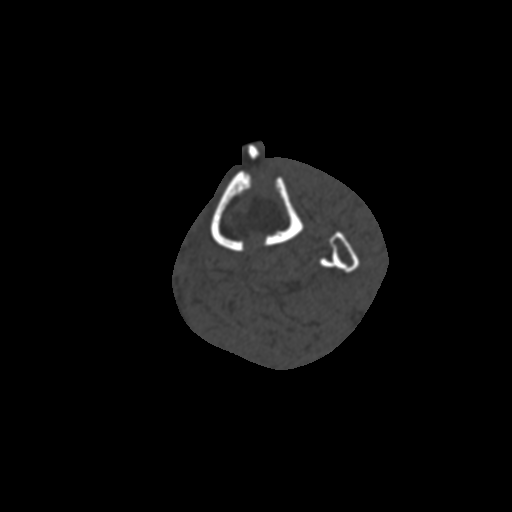
[im 201/291  bone]
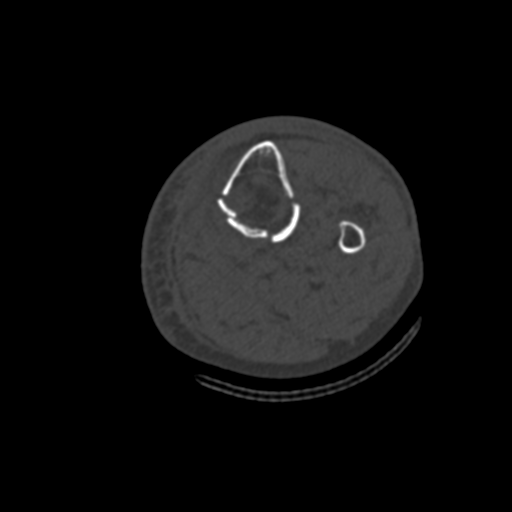
[im 224/291  bone]
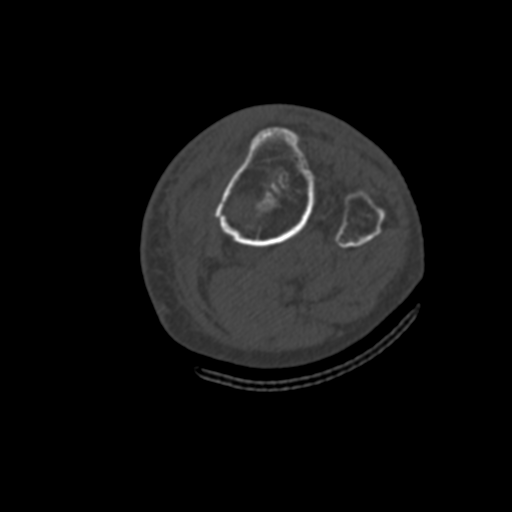
[im 268/291  bone]
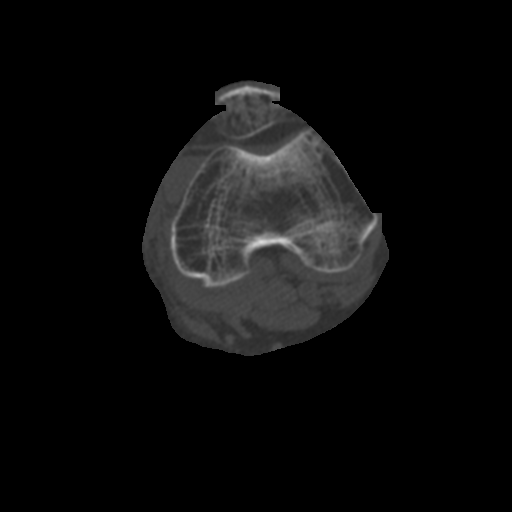

[11 of 33 positions shown; findings below may reference images not displayed]

FINDINGS: Bones/Joint/Cartilage

There is a deeply impacted fracture of the medial tibial plateau.
Maximum to impaction is approximately 2.6 cm. There is also a
comminuted fracture of the proximal and midshaft of the left tibia
with slight angulation and displacement.

There is a comminuted slightly displaced spiral fracture of proximal
fibular shaft.

There is a small hemarthrosis in the knee joint.

The distal tibia and fibula are intact. Ankle joint appears normal.

Distal femur is osteopenic but otherwise normal.

Ligaments

Suboptimally assessed by CT. The cruciate ligaments and the lateral
collateral ligament are intact. Medial collateral ligament appears
to be intact.

Muscles and Tendons

No significant abnormalities.

Soft tissues

Soft tissue swelling around the knee.
IMPRESSION: 1. Comminuted deeply impacted fracture of the medial tibial plateau.
2. Comminuted fracture of the proximal and midshaft of the left
tibia with slight angulation and displacement.
3. Comminuted slightly displaced spiral fracture of the proximal
fibular shaft.
4. Small hemarthrosis in the knee joint.

## 2020-11-28 IMAGING — RF DG C-ARM 1-60 MIN
1 series · 8 of 8 positions shown · non-contrast
Comparison: Left tib-fib series 07/11/2019.

CLINICAL DATA: 73-year-old female undergoing external fixator
placement.

EXAM:
DG C-ARM 1-60 MIN; LEFT TIBIA AND FIBULA - 2 VIEW
FLUOROSCOPY TIME:  Fluoroscopy Time:  0 minutes 51 seconds
Radiation Exposure Index (if provided by the fluoroscopic device):
Number of Acquired Spot Images: 0

[Series 1: run · 8 of 8 slices shown]
[im 1/8]
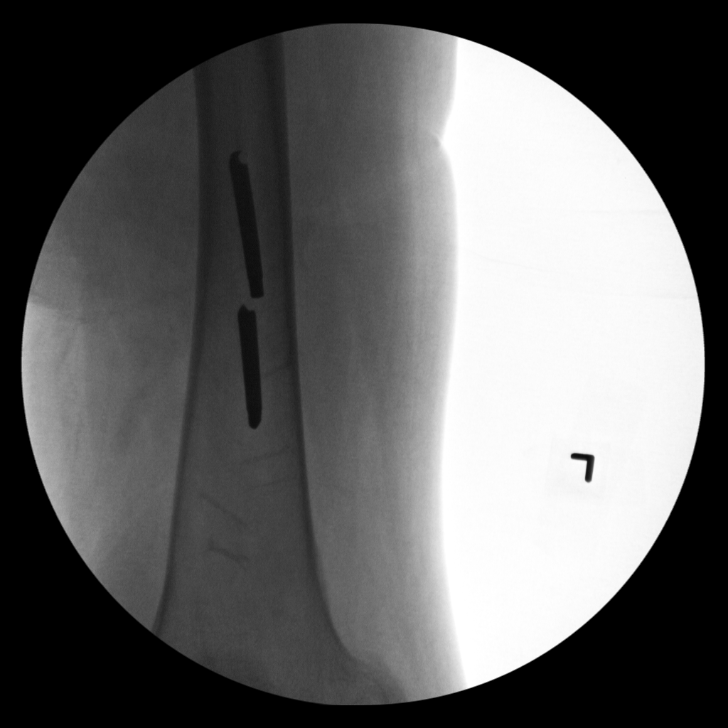
[im 2/8]
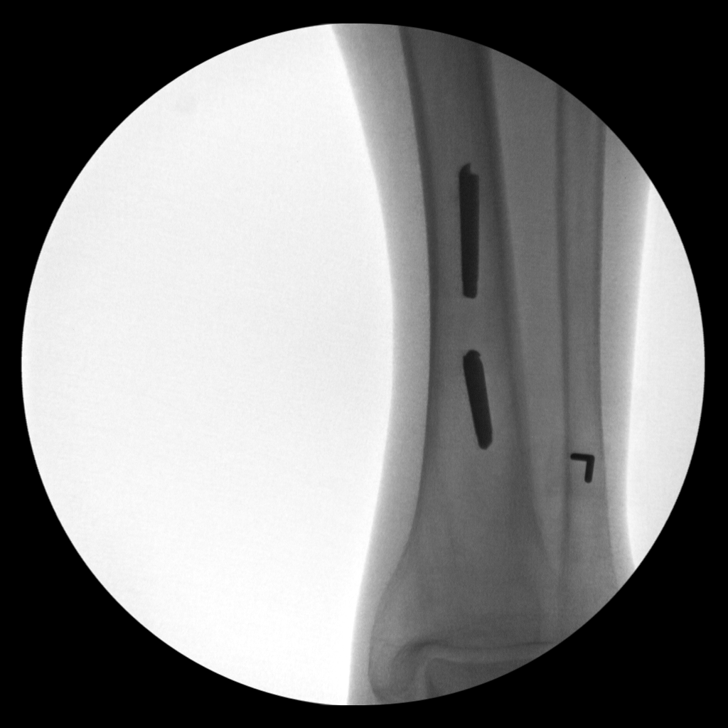
[im 3/8]
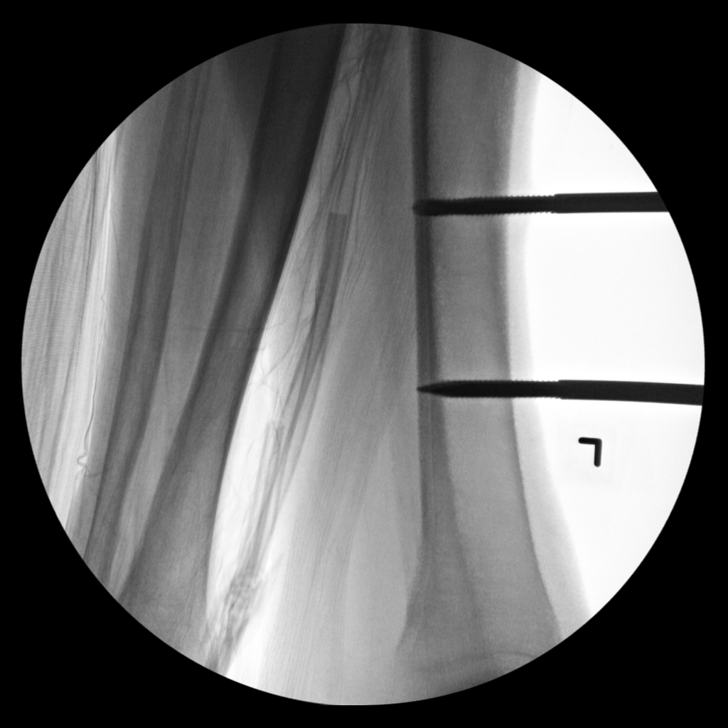
[im 4/8]
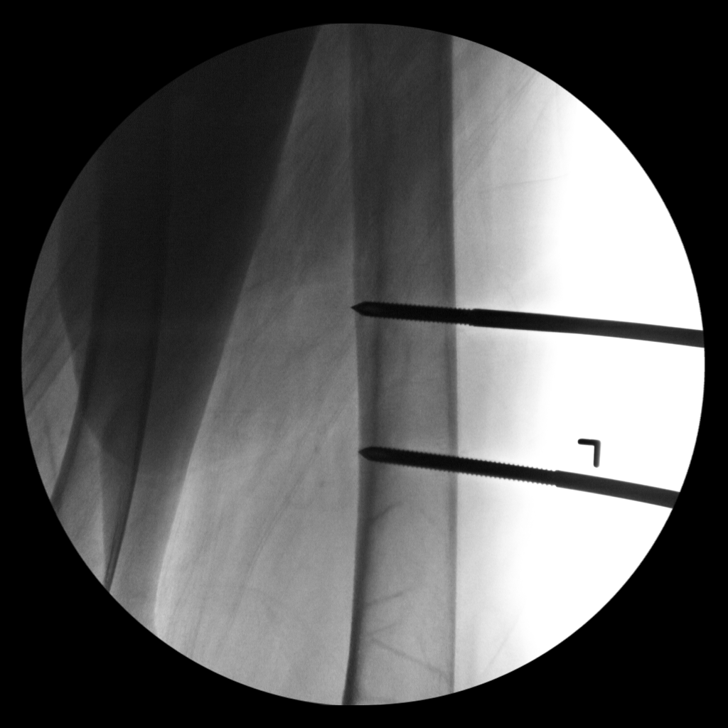
[im 5/8]
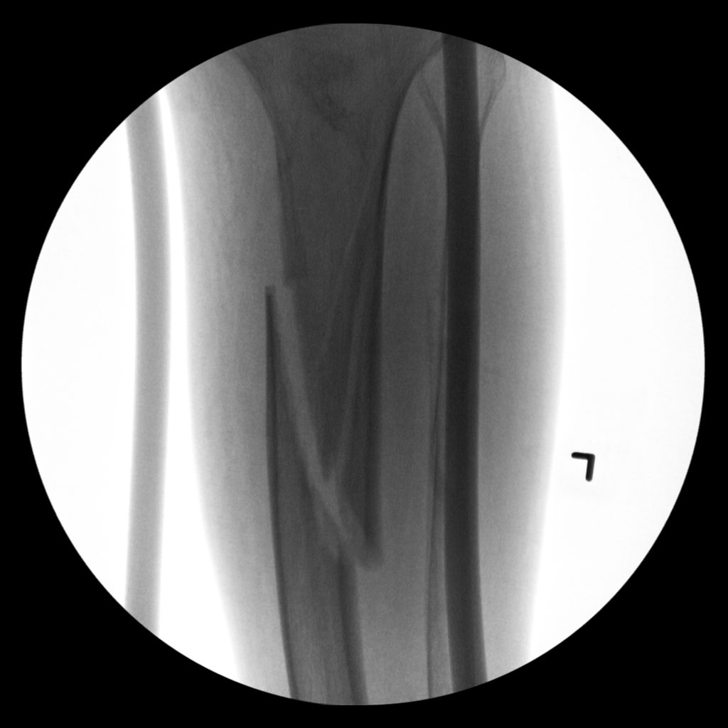
[im 6/8]
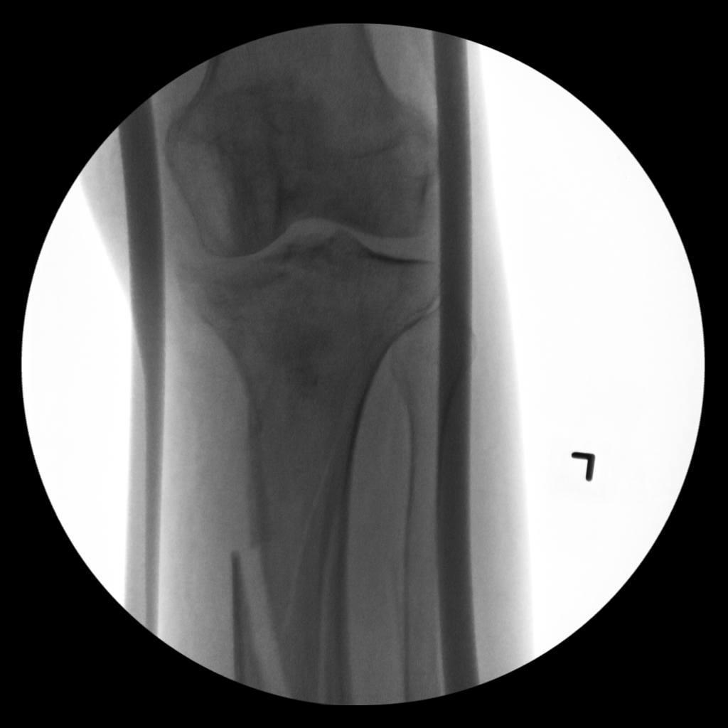
[im 7/8]
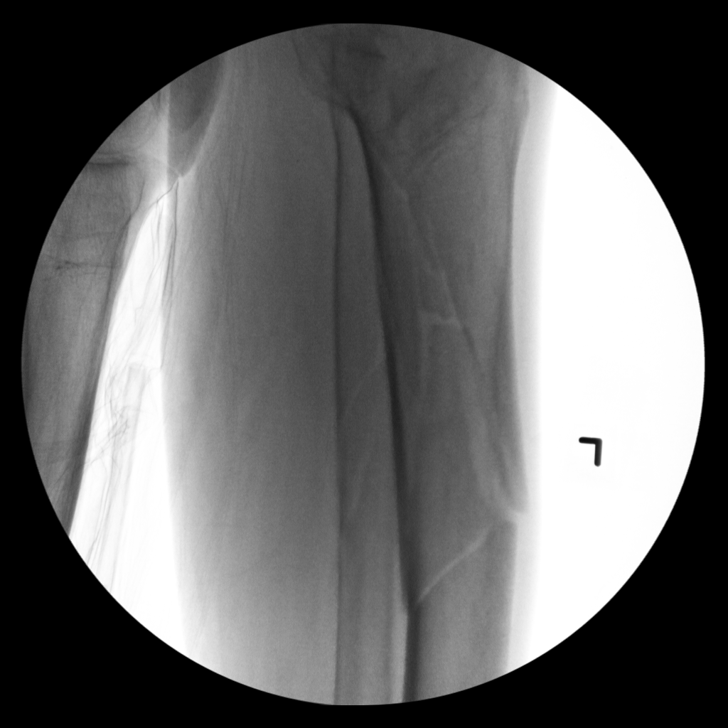
[im 8/8]
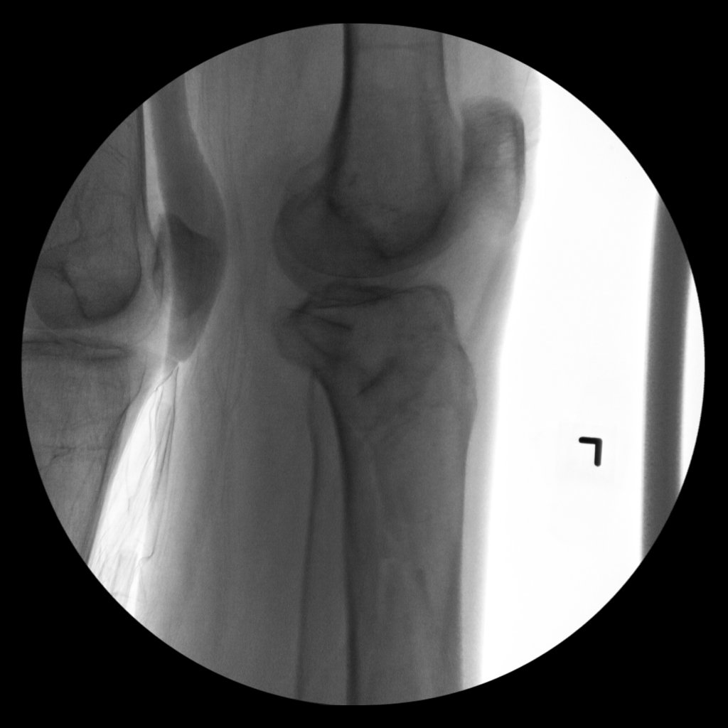

[8 of 8 positions shown; findings below may reference images not displayed]

FINDINGS: Eight intraoperative fluoroscopic spot views of the left tib fib
with placement of anterior cortical screws through what appears to
be the distal left tibia shaft and probably the left femur shaft.
Highly comminuted proximal left tibia shaft fracture redemonstrated.
Impaction of the medial tibial plateau again noted. Associated
fracture of the left fibula shaft less apparent.
IMPRESSION: Intraoperative images from external fixator device placement about
the left lower extremity.

## 2021-02-01 ENCOUNTER — Encounter: Payer: Self-pay | Admitting: Family Medicine

## 2021-04-14 ENCOUNTER — Ambulatory Visit: Payer: Medicare HMO | Admitting: Gastroenterology

## 2021-04-14 ENCOUNTER — Encounter: Payer: Self-pay | Admitting: Gastroenterology

## 2021-04-14 ENCOUNTER — Other Ambulatory Visit: Payer: Self-pay

## 2021-04-14 VITALS — BP 138/74 | HR 101 | Temp 99.2°F | Wt 107.2 lb

## 2021-04-14 DIAGNOSIS — R195 Other fecal abnormalities: Secondary | ICD-10-CM | POA: Diagnosis not present

## 2021-04-14 DIAGNOSIS — K5900 Constipation, unspecified: Secondary | ICD-10-CM

## 2021-04-14 DIAGNOSIS — K7469 Other cirrhosis of liver: Secondary | ICD-10-CM

## 2021-04-14 NOTE — Patient Instructions (Addendum)
Ultrasound scheduled for   [facility] [address] [place], [place] [address] arrive at 10:00AM to check in.  You must have nothing to eat or drink 6 hours prior.  If you need to rescheduled, please call [phone]

## 2021-04-14 NOTE — Progress Notes (Signed)
Alexis Nichols [address]  [place], [place] [address]  Main: [phone]  Fax: [phone]   Gastroenterology Consultation  Referring Provider:     Marguerita Merles, MD Primary Care Physician:  Marguerita Merles, MD Reason for Consultation:     Constipation        HPI:    Chief complaint: Constipation  Alexis Nichols is a 75 y.o. y/o female referred for consultation & management  by Dr. Lennox Grumbles, Connye Burkitt, MD. patient reports chronic history of constipation since 2020.  Has been taking MiraLAX every other day and with this she says she does pretty well.  She says she has a soft bowel movement every day with this.  Denies any blood in stool.  No abdominal pain, nausea or vomiting.  Denies any abdominal bloating.  No dysphagia.  Good appetite.  PCP notes reviewed and had prescribed her lactulose for constipation but patient states she did not like it and so she stopped taking it.  She states she made this appointment as she wanted to make sure that it is okay to keep taking MiraLAX given that she has been taking it for 2 years.  Patient denies any previous screening colonoscopies.  However, states had a stool test a few years ago that was positive but never had a colonoscopy after that.  Then [employer] sent her a stool test to be done at home and she states that was normal.  Scanned notes from [facility] in her chart reviewed and do not mention the most recent stool test that she has had done that she states was normal.  From her description this may have been a Cologuard test.  However, scan notes do report that she had FOBT test positive in 2017.  Patient does not know why she did not have a colonoscopy after that.  PCP notes also report history of alcohol abuse, and history of alcoholic cirrhosis as mentioned in the notes as well.  However, patient denies ever seeing a gastroenterologist before and did not know that she had cirrhosis.  She did say when  she was heavily drinking she had to be admitted 1 time due to electrolyte abnormalities but does not report any admissions for ascites, bleeding, confusion or other complications that would be related to cirrhosis.  Patient had a CT scan in July 2021 that does not report cirrhosis of the liver  She was seen by hematology, note reviewed, and they note normocytic anemia,   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(Blue)    CKD (chronic kidney disease)    Fracture of tibial shaft, left, closed 07/14/2019   GERD (gastroesophageal reflux disease)    HTN (hypertension)    Osteoporosis    Vitamin D insufficiency 07/14/2019    Past Surgical History:  Procedure Laterality Date   BRAIN TUMOR EXCISION  11/2018   excision of acoustic neuroma    EXTERNAL FIXATION LEG Left 07/12/2019   Procedure: EXTERNAL FIXATION LEG;  Surgeon: Marchia Bond, MD;  Location: [facility];  Service: Orthopedics;  Laterality: Left;   EXTERNAL FIXATION REMOVAL Left 07/15/2019   Procedure: Removal External Fixation Leg;  Surgeon: Altamese [place] Creek, MD;  Location: [facility];  Service: Orthopedics;  Laterality: Left;   ORIF TIBIA FRACTURE Left 07/15/2019   Procedure: OPEN REDUCTION INTERNAL FIXATION (ORIF) TIBIA FRACTURE;  Surgeon: Altamese Beulah, MD;  Location: [facility];  Service: Orthopedics;  Laterality: Left;   ORIF TIBIA  PLATEAU Left 07/15/2019   Procedure: OPEN REDUCTION INTERNAL FIXATION (ORIF) TIBIAL PLATEAU;  Surgeon: Altamese [place], MD;  Location: [facility];  Service: Orthopedics;  Laterality: Left;    Prior to Admission medications   Medication Sig Start Date End Date Taking? Authorizing Provider  acetaminophen (TYLENOL) 500 MG tablet Take 1 tablet (500 mg total) by mouth every 12 (twelve) hours. 07/24/19  Yes Ainsley Spinner, PA-C  docusate sodium (COLACE) 100 MG capsule Take 1 capsule (100 mg total) by mouth 2 (two) times daily. 07/24/19  Yes Ainsley Spinner, PA-C  feeding supplement, ENSURE ENLIVE, (ENSURE ENLIVE) LIQD  Take 237 mLs by mouth daily at 8 pm. 07/24/19  Yes Ainsley Spinner, PA-C  metoprolol tartrate (LOPRESSOR) 25 MG tablet Take 25 mg by mouth daily.  01/30/19  Yes [provider]  Multiple Vitamins-Minerals (PRESERVISION AREDS PO) Take by mouth.   Yes [provider]  omeprazole (PRILOSEC) 40 MG capsule Take 40 mg by mouth daily. 07/03/19  Yes [provider]  solifenacin (VESICARE) 10 MG tablet Take 10 mg by mouth daily.    Yes [provider]  spironolactone (ALDACTONE) 50 MG tablet Take 50 mg by mouth daily.  01/30/19  Yes [provider]  vitamin B-12 (CYANOCOBALAMIN) 1000 MCG tablet Take 1,000 mcg by mouth daily.   Yes [provider]    No family history on file.   Social History   Tobacco Use   Smoking status: Never   Smokeless tobacco: Never  Substance Use Topics   Alcohol use: Never    Allergies as of 04/14/2021   (No Known Allergies)    Review of Systems:    All systems reviewed and negative except where noted in HPI.   Physical Exam:  Constitutional: General:   Alert,  Well-developed, well-nourished, pleasant and cooperative in NAD BP 138/74   Pulse (!) 101   Temp 99.2 F (37.3 C) (Oral)   Wt 107 lb 3.2 oz (48.6 kg)   BMI 19.61 kg/m   Eyes:  Sclera clear, no icterus.   Conjunctiva pink. PERRLA  Ears:  No scars, lesions or masses, Normal auditory acuity. Nose:  No deformity, discharge, or lesions. Mouth:  No deformity or lesions, oropharynx pink & moist.  Neck:  Supple; no masses or thyromegaly.  Respiratory: Normal respiratory effort, Normal percussion  Gastrointestinal: Soft, non-tender and non-distended without masses, hepatosplenomegaly or hernias noted.  No guarding or rebound tenderness.     Cardiac: No clubbing or edema.  No cyanosis. Normal posterior tibial pedal pulses noted.  Lymphatic:  No significant cervical or axillary adenopathy.  Psych:  Alert and cooperative. Normal mood and  affect.  Musculoskeletal:  Normal gait. Head normocephalic, atraumatic. Symmetrical without gross deformities. 5/5 Upper and Lower extremity strength bilaterally.  Skin: Warm. Intact without significant lesions or rashes. No jaundice.  Neurologic:  Face symmetrical, tongue midline, Normal sensation to touch;  grossly normal neurologically.  Psych:  Alert and oriented x3, Alert and cooperative. Normal mood and affect.   Labs: CBC    Component Value Date/Time   WBC 8.9 10/19/2020 1103   RBC 4.85 10/19/2020 1103   HGB 14.8 10/19/2020 1103   HGB 12.2 12/25/2011 0531   HCT 44.9 10/19/2020 1103   HCT 37.0 12/25/2011 0531   PLT 274 10/19/2020 1103   PLT 169 12/25/2011 0531   MCV 92.6 10/19/2020 1103   MCV 90 12/25/2011 0531   MCH 30.5 10/19/2020 1103   MCHC 33.0 10/19/2020 1103   RDW 12.7 10/19/2020  1103   RDW 14.3 12/25/2011 0531   LYMPHSABS 1.6 10/19/2020 1103   LYMPHSABS 1.4 12/25/2011 0531   MONOABS 0.6 10/19/2020 1103   MONOABS 0.3 12/25/2011 0531   EOSABS 0.1 10/19/2020 1103   EOSABS 0.1 12/25/2011 0531   BASOSABS 0.1 10/19/2020 1103   BASOSABS 0.0 12/25/2011 0531   CMP     Component Value Date/Time   NA 137 01/22/2020 1127   NA 136 12/25/2011 0531   K 4.2 01/22/2020 1127   K 4.1 12/25/2011 0531   CL 99 01/22/2020 1127   CL 108 (H) 12/25/2011 0531   CO2 30 01/22/2020 1127   CO2 21 12/25/2011 0531   GLUCOSE 113 (H) 01/22/2020 1127   GLUCOSE 78 12/25/2011 0531   BUN 20 01/22/2020 1127   BUN 13 12/25/2011 0531   CREATININE 1.12 (H) 01/22/2020 1127   CREATININE 1.27 12/25/2011 0531   CALCIUM 9.8 01/22/2020 1127   CALCIUM 8.8 07/17/2019 0233   PROT 7.3 01/22/2020 1127   PROT 6.6 12/24/2011 0501   ALBUMIN 4.3 01/22/2020 1127   ALBUMIN 3.4 12/24/2011 0501   AST 22 01/22/2020 1127   AST 31 12/24/2011 0501   ALT 17 01/22/2020 1127   ALT 15 12/24/2011 0501   ALKPHOS 98 01/22/2020 1127   ALKPHOS 103 12/24/2011 0501   BILITOT 0.6 01/22/2020 1127   BILITOT 0.4  12/24/2011 0501   GFRNONAA 48 (L) 01/22/2020 1127   GFRNONAA 44 (L) 12/25/2011 0531   GFRAA 56 (L) 01/22/2020 1127   GFRAA 51 (L) 12/25/2011 0531   Scanned labs from January 27, 2021 reviewed, and showed normal liver enzymes and CMP.  CBC showed normal platelets  Imaging Studies: No results found.  Assessment and Plan:   Alexis Nichols is a 75 y.o. y/o female has been referred for constipation, that responds well to [facility], with fecal occult blood test positive in 2017, and no prior colonoscopies  High-fiber diet recommended for constipation Since MiraLAX is treating her constipation very well, okay to continue current dose which is every other day for this patient  We extensively discussed the fecal occult blood test positive in 2017 since she never had a subsequent colonoscopy to evaluate this further.  We discussed that even if the stool test that she had about a year and a half ago was negative, we cannot definitively rule out colon cancer given the positive fecal occult blood test in 2017 until the colonoscopy is done.  The negative stool test a year and a half ago is not available to me, but even if it were negative it may lower the probability that she has colon cancer, but cannot definitively rule it out given the positive test prior to that and no endoscopic visualization of her colon in her lifetime.  After discussing risk and benefits of colonoscopy in detail, taking into account her age above 70, patient would like to proceed with colonoscopy for evaluation of positive stool test in 2017  Given her history of alcoholic cirrhosis as listed in her PCP notes, with no clinical evidence of cirrhosis with normal platelets, liver enzymes, and no clinical symptoms of such, I will obtain right upper quadrant ultrasound to evaluate the liver further  I have discussed alternative options, risks & benefits,  which include, but are not limited to, bleeding, infection, perforation,respiratory  complication & drug reaction.  The patient agrees with this plan & written consent will be obtained.      Dr Alexis Nichols  Speech recognition software  was used to dictate the above note.

## 2021-04-26 ENCOUNTER — Ambulatory Visit: Payer: Medicare HMO

## 2021-05-13 ENCOUNTER — Telehealth: Payer: Self-pay | Admitting: Gastroenterology

## 2021-05-13 NOTE — Telephone Encounter (Signed)
Pt. Called pre service center to cancel colonoscopy.

## 2021-05-16 NOTE — Telephone Encounter (Signed)
Called Endo to cancel procedure

## 2021-05-17 ENCOUNTER — Encounter: Admission: RE | Payer: Self-pay | Source: Home / Self Care

## 2021-05-17 ENCOUNTER — Ambulatory Visit: Admission: RE | Admit: 2021-05-17 | Payer: Medicare HMO | Source: Home / Self Care | Admitting: Gastroenterology

## 2021-05-17 SURGERY — COLONOSCOPY WITH PROPOFOL
Anesthesia: General

## 2021-11-01 DIAGNOSIS — I6523 Occlusion and stenosis of bilateral carotid arteries: Secondary | ICD-10-CM | POA: Insufficient documentation

## 2021-11-01 DIAGNOSIS — I2510 Atherosclerotic heart disease of native coronary artery without angina pectoris: Secondary | ICD-10-CM | POA: Insufficient documentation

## 2021-11-10 ENCOUNTER — Ambulatory Visit (INDEPENDENT_AMBULATORY_CARE_PROVIDER_SITE_OTHER): Payer: Medicare HMO | Admitting: Dermatology

## 2021-11-10 DIAGNOSIS — L820 Inflamed seborrheic keratosis: Secondary | ICD-10-CM

## 2021-11-10 DIAGNOSIS — L2081 Atopic neurodermatitis: Secondary | ICD-10-CM | POA: Diagnosis not present

## 2021-11-10 DIAGNOSIS — D692 Other nonthrombocytopenic purpura: Secondary | ICD-10-CM | POA: Diagnosis not present

## 2021-11-10 MED ORDER — TRIAMCINOLONE ACETONIDE 0.1 % EX CREA: TOPICAL_CREAM | CUTANEOUS | 3 refills | Status: DC

## 2021-11-10 NOTE — Patient Instructions (Addendum)
Cryotherapy Aftercare ? ?Wash gently with soap and water everyday.   ?Apply Vaseline and Band-Aid daily until healed.  ? ? ?Topical steroids (such as triamcinolone, fluocinolone, fluocinonide, mometasone, clobetasol, halobetasol, betamethasone, hydrocortisone) can cause thinning and lightening of the skin if they are used for too long in the same area. Your physician has selected the right strength medicine for your problem and area affected on the body. Please use your medication only as directed by your physician to prevent side effects.  ?  ?DO NOT USE TRIAMCINOLONE CREAM AROUND THE NECK , ARMS OR FACE! ? ? ?If You Need Anything After Your Visit ? ?If you have any questions or concerns for your doctor, please call our main line at [phone] and press option 4 to reach your doctor's medical assistant. If no one answers, please leave a voicemail as directed and we will return your call as soon as possible. Messages left after 4 pm will be answered the following business day.  ? ?You may also send [place] a message via MyChart. We typically respond to MyChart messages within 1-2 business days. ? ?For prescription refills, please ask your pharmacy to contact our office. Our fax number is [phone]. ? ?If you have an urgent issue when the clinic is closed that cannot wait until the next business day, you can page your doctor at the number below.   ? ?Please note that while we do our best to be available for urgent issues outside of office hours, we are not available 24/7.  ? ?If you have an urgent issue and are unable to reach [place], you may choose to seek medical care at your doctor's office, retail clinic, urgent care center, or emergency room. ? ?If you have a medical emergency, please immediately call 911 or go to the emergency department. ? ?Pager Numbers ? ?- Dr. Nehemiah Massed: [phone] ? ?- Dr. Laurence Ferrari: [phone] ? ?- Dr. Nicole Kindred: [phone] ? ?In the event of inclement weather, please call our main line at  [phone] for an update on the status of any delays or closures. ? ?Dermatology Medication Tips: ?Please keep the boxes that topical medications come in in order to help keep track of the instructions about where and how to use these. Pharmacies typically print the medication instructions only on the boxes and not directly on the medication tubes.  ? ?If your medication is too expensive, please contact our office at [phone] option 4 or send [place] a message through [place].  ? ?We are unable to tell what your co-pay for medications will be in advance as this is different depending on your insurance coverage. However, we may be able to find a substitute medication at lower cost or fill out paperwork to get insurance to cover a needed medication.  ? ?If a prior authorization is required to get your medication covered by your insurance company, please allow [place] 1-2 business days to complete this process. ? ?Drug prices often vary depending on where the prescription is filled and some pharmacies may offer cheaper prices. ? ?The website www.goodrx.com contains coupons for medications through different pharmacies. The prices here do not account for what the cost may be with help from insurance (it may be cheaper with your insurance), but the website can give you the price if you did not use any insurance.  ?- You can print the associated coupon and take it with your prescription to the pharmacy.  ?- You may also stop by our office during regular business hours and  pick up a GoodRx coupon card.  ?- If you need your prescription sent electronically to a different pharmacy, notify our office through [facility] or by phone at [phone] option 4. ? ? ? ? ?Si Usted Necesita Algo Despu?s de Su Visita ? ?Tambi?n puede enviarnos un mensaje a trav?s de MyChart. Por lo general respondemos a los mensajes de MyChart en el transcurso de 1 a 2 d?as h?biles. ? ?Para renovar recetas, por favor pida a su farmacia que se  ponga en contacto con nuestra oficina. Nuestro n?mero de fax es el [phone]. ? ?Si tiene un asunto urgente cuando la cl?nica est? cerrada y que no puede esperar hasta el siguiente d?a h?bil, puede llamar/localizar a su doctor(a) al n?mero que aparece a continuaci?n.  ? ?Por favor, tenga en cuenta que aunque hacemos todo lo posible para estar disponibles para asuntos urgentes fuera del horario de oficina, no estamos disponibles las 24 horas del d?a, los 7 d?as de la semana.  ? ?Si tiene un problema urgente y no puede comunicarse con nosotros, puede optar por buscar atenci?n m?dica  en el consultorio de su doctor(a), en una cl?nica privada, en un centro de atenci?n urgente o en una sala de emergencias. ? ?Si tiene Engineer, maintenance (IT) m?dica, por favor llame inmediatamente al 911 o vaya a la sala de emergencias. ? ?N?meros de b?per ? ?- Dr. Nehemiah Massed: [phone] ? ?- Dra. Moye: [phone] ? ?- Dra. Nicole Kindred: [phone] ? ?En caso de inclemencias del tiempo, por favor llame a nuestra l?nea principal al [phone] para una actualizaci?n sobre el estado de cualquier retraso o cierre. ? ?Consejos para la medicaci?n en dermatolog?a: ?Por favor, guarde las cajas en las que vienen los medicamentos de uso t?pico para ayudarle a seguir las instrucciones sobre d?nde y c?mo usarlos. Las farmacias generalmente imprimen las instrucciones del medicamento s?lo en las cajas y no directamente en los tubos del [place].  ? ?Si su medicamento es muy caro, por favor, p?ngase en contacto con Zigmund Daniel llamando al [phone] y presione la opci?n 4 o env?enos un mensaje a trav?s de MyChart.  ? ?No podemos decirle cu?l ser? su copago por los medicamentos por adelantado ya que esto es diferente dependiendo de la cobertura de su seguro. Sin embargo, es posible que podamos encontrar un medicamento sustituto a Electrical engineer un formulario para que el seguro cubra el medicamento que se considera necesario.  ? ?Si se requiere  Ardelia Mems autorizaci?n previa para que su compa??a de seguros [place] su medicamento, por favor perm?tanos de 1 a 2 d?as h?biles para completar este proceso. ? ?Los precios de los medicamentos var?an con frecuencia dependiendo del Environmental consultant de d?nde se surte la receta y alguna farmacias pueden ofrecer precios m?s baratos. ? ?El sitio web www.goodrx.com tiene cupones para medicamentos de Airline pilot. Los precios aqu? no tienen en cuenta lo que podr?a costar con la ayuda del seguro (puede ser m?s barato con su seguro), pero el sitio web puede darle el precio si no utiliz? ning?n seguro.  ?- Puede imprimir el cup?n correspondiente y llevarlo con su receta a la farmacia.  ?- Tambi?n puede pasar por nuestra oficina durante el horario de atenci?n regular y recoger una tarjeta de cupones de GoodRx.  ?- Si necesita que su receta se env?e electr?nicamente a Chiropodist, informe a nuestra oficina a trav?s de MyChart de [place] o por tel?fono llamando al [phone] y presione la opci?n 4.  ?

## 2021-11-10 NOTE — Progress Notes (Signed)
? ?  New Patient Visit ? ?Subjective  ?Alexis Nichols is a 76 y.o. female who presents for the following: Skin Problem (Check itchy spots on her neck and back. ). Patient report long his eczema on her legs that will come and go, treating with Triamcinolone cream with a fair response.  ?The patient has spots, moles and lesions to be evaluated, some may be new or changing and the patient has concerns that these could be cancer. ? ?The following portions of the chart were reviewed this encounter and updated as appropriate:  ? Tobacco  Allergies  Meds  Problems  Med Hx  Surg Hx  Fam Hx   ?  ?Review of Systems:  No other skin or systemic complaints except as noted in HPI or Assessment and Plan. ? ?Objective  ?Well appearing patient in no apparent distress; mood and affect are within normal limits. ? ?A focused examination was performed including face,arms,legs,chest,back. Relevant physical exam findings are noted in the Assessment and Plan. ? ?Left Abdomen (side) - Lower ?Pink scaly patches on the legs  ? ?neck x 4, trunk x 3   (7) ?Stuck-on, waxy, tan-Bambrick papules--Discussed benign etiology and prognosis.  ? ? ?Assessment & Plan  ?Atopic neurodermatitis ?Left Abdomen (side) - Lower ? ?Atopic dermatitis (eczema) is a chronic, relapsing, pruritic condition that can significantly affect quality of life. It is often associated with allergic rhinitis and/or asthma and can require treatment with topical medications, phototherapy, or in severe cases biologic injectable medication (Dupixent; Adbry) or Oral JAK inhibitors.  ? ?Start  Triamcinolone 0.1% cream apply to eczema areas on the body qd-bid up to 5 days a week, Avoid applying to face, groin, and axilla. Use as directed. Long-term use can cause thinning of the skin.  ? ?Topical steroids (such as triamcinolone, fluocinolone, fluocinonide, mometasone, clobetasol, halobetasol, betamethasone, hydrocortisone) can cause thinning and lightening of the skin if they are used  for too long in the same area. Your physician has selected the right strength medicine for your problem and area affected on the body. Please use your medication only as directed by your physician to prevent side effects.   ? ?Inflamed seborrheic keratosis ?neck x 4, trunk x 3   (7) ? ?Reassured benign age-related growth.  Recommend observation.  Discussed cryotherapy if spot(s) become irritated or inflamed.  ? ?Destruction of lesion - neck x 4, trunk x 3   (7) ?Complexity: simple   ?Destruction method: cryotherapy   ?Informed consent: discussed and consent obtained   ?Timeout:  patient name, date of birth, surgical site, and procedure verified ?Lesion destroyed using liquid nitrogen: Yes   ?Region frozen until ice ball extended beyond lesion: Yes   ?Outcome: patient tolerated procedure well with no complications   ?Post-procedure details: wound care instructions given   ? ?Purpura with atrophy- Chronic; persistent and recurrent.  Treatable, but not curable. ?Do not use topical steroid cream around the neck  ?- Violaceous macules and patches ?- Benign ?- Related to trauma, age, sun damage and/or use of blood thinners, chronic use of topical and/or oral steroids ?- Observe ?- Can use OTC arnica containing moisturizer such as Dermend Bruise Formula if desired ?- Call for worsening or other concerns  ? ?Return if symptoms worsen or fail to improve. ? ?I, Marye Round, CMA, am acting as scribe for Sarina Ser, MD .  ?Documentation: I have reviewed the above documentation for accuracy and completeness, and I agree with the above. ? ?Sarina Ser, MD ? ?

## 2021-11-15 ENCOUNTER — Inpatient Hospital Stay
Admission: EM | Admit: 2021-11-15 | Discharge: 2021-11-19 | DRG: 872 | Disposition: A | Discharge status: Home / Self Care | Payer: Medicare HMO | Attending: Internal Medicine | Admitting: Internal Medicine

## 2021-11-15 ENCOUNTER — Emergency Department: Payer: Medicare HMO

## 2021-11-15 ENCOUNTER — Other Ambulatory Visit: Payer: Self-pay

## 2021-11-15 DIAGNOSIS — K219 Gastro-esophageal reflux disease without esophagitis: Secondary | ICD-10-CM | POA: Diagnosis present

## 2021-11-15 DIAGNOSIS — B3781 Candidal esophagitis: Secondary | ICD-10-CM | POA: Diagnosis present

## 2021-11-15 DIAGNOSIS — I493 Ventricular premature depolarization: Secondary | ICD-10-CM | POA: Diagnosis present

## 2021-11-15 DIAGNOSIS — K5730 Diverticulosis of large intestine without perforation or abscess without bleeding: Secondary | ICD-10-CM | POA: Diagnosis present

## 2021-11-15 DIAGNOSIS — E876 Hypokalemia: Secondary | ICD-10-CM | POA: Diagnosis not present

## 2021-11-15 DIAGNOSIS — R002 Palpitations: Secondary | ICD-10-CM | POA: Diagnosis present

## 2021-11-15 DIAGNOSIS — A419 Sepsis, unspecified organism: Principal | ICD-10-CM | POA: Diagnosis present

## 2021-11-15 DIAGNOSIS — M810 Age-related osteoporosis without current pathological fracture: Secondary | ICD-10-CM | POA: Diagnosis present

## 2021-11-15 DIAGNOSIS — N182 Chronic kidney disease, stage 2 (mild): Secondary | ICD-10-CM | POA: Diagnosis present

## 2021-11-15 DIAGNOSIS — Z79899 Other long term (current) drug therapy: Secondary | ICD-10-CM

## 2021-11-15 DIAGNOSIS — N390 Urinary tract infection, site not specified: Secondary | ICD-10-CM | POA: Diagnosis present

## 2021-11-15 DIAGNOSIS — N189 Chronic kidney disease, unspecified: Secondary | ICD-10-CM | POA: Diagnosis present

## 2021-11-15 DIAGNOSIS — K259 Gastric ulcer, unspecified as acute or chronic, without hemorrhage or perforation: Secondary | ICD-10-CM

## 2021-11-15 DIAGNOSIS — I4891 Unspecified atrial fibrillation: Secondary | ICD-10-CM | POA: Diagnosis present

## 2021-11-15 DIAGNOSIS — I10 Essential (primary) hypertension: Secondary | ICD-10-CM | POA: Diagnosis present

## 2021-11-15 DIAGNOSIS — K311 Adult hypertrophic pyloric stenosis: Secondary | ICD-10-CM | POA: Diagnosis present

## 2021-11-15 DIAGNOSIS — I129 Hypertensive chronic kidney disease with stage 1 through stage 4 chronic kidney disease, or unspecified chronic kidney disease: Secondary | ICD-10-CM | POA: Diagnosis present

## 2021-11-15 DIAGNOSIS — R1314 Dysphagia, pharyngoesophageal phase: Secondary | ICD-10-CM | POA: Diagnosis present

## 2021-11-15 DIAGNOSIS — R112 Nausea with vomiting, unspecified: Secondary | ICD-10-CM | POA: Diagnosis present

## 2021-11-15 DIAGNOSIS — K635 Polyp of colon: Secondary | ICD-10-CM | POA: Diagnosis present

## 2021-11-15 DIAGNOSIS — K317 Polyp of stomach and duodenum: Secondary | ICD-10-CM | POA: Diagnosis present

## 2021-11-15 DIAGNOSIS — K2210 Ulcer of esophagus without bleeding: Secondary | ICD-10-CM

## 2021-11-15 LAB — CBC
HCT: 45.6 % (ref 36.0–46.0)
Hemoglobin: 14.8 g/dL (ref 12.0–15.0)
MCH: 30 pg (ref 26.0–34.0)
MCHC: 32.5 g/dL (ref 30.0–36.0)
MCV: 92.3 fL (ref 80.0–100.0)
Platelets: 307 10*3/uL (ref 150–400)
RBC: 4.94 MIL/uL (ref 3.87–5.11)
RDW: 13.4 % (ref 11.5–15.5)
WBC: 21.1 10*3/uL — ABNORMAL HIGH (ref 4.0–10.5)
nRBC: 0 % (ref 0.0–0.2)

## 2021-11-15 LAB — URINALYSIS, ROUTINE W REFLEX MICROSCOPIC
Bilirubin Urine: NEGATIVE
Glucose, UA: NEGATIVE mg/dL
Hgb urine dipstick: NEGATIVE
Ketones, ur: 20 mg/dL — AB
Nitrite: NEGATIVE
Protein, ur: 30 mg/dL — AB
Specific Gravity, Urine: 1.018 (ref 1.005–1.030)
pH: 9 — ABNORMAL HIGH (ref 5.0–8.0)

## 2021-11-15 LAB — TROPONIN I (HIGH SENSITIVITY): Troponin I (High Sensitivity): 24 ng/L — ABNORMAL HIGH (ref <18)

## 2021-11-15 LAB — COMPREHENSIVE METABOLIC PANEL
ALT: 14 U/L (ref 0–44)
AST: 29 U/L (ref 15–41)
Albumin: 3.9 g/dL (ref 3.5–5.0)
Alkaline Phosphatase: 109 U/L (ref 38–126)
Anion gap: 15 (ref 5–15)
BUN: 25 mg/dL — ABNORMAL HIGH (ref 8–23)
CO2: 33 mmol/L — ABNORMAL HIGH (ref 22–32)
Calcium: 10.1 mg/dL (ref 8.9–10.3)
Chloride: 94 mmol/L — ABNORMAL LOW (ref 98–111)
Creatinine, Ser: 1.14 mg/dL — ABNORMAL HIGH (ref 0.44–1.00)
GFR, Estimated: 50 mL/min — ABNORMAL LOW (ref >60)
Glucose, Bld: 139 mg/dL — ABNORMAL HIGH (ref 70–99)
Potassium: 3.3 mmol/L — ABNORMAL LOW (ref 3.5–5.1)
Sodium: 142 mmol/L (ref 135–145)
Total Bilirubin: 1 mg/dL (ref 0.3–1.2)
Total Protein: 6.9 g/dL (ref 6.5–8.1)

## 2021-11-15 LAB — LACTIC ACID, PLASMA: Lactic Acid, Venous: 2.3 mmol/L (ref 0.5–1.9)

## 2021-11-15 LAB — LIPASE, BLOOD: Lipase: 41 U/L (ref 11–51)

## 2021-11-15 MED ORDER — PANTOPRAZOLE SODIUM 40 MG PO TBEC
40.0000 mg | DELAYED_RELEASE_TABLET | Freq: Every day | ORAL | Status: DC
  Administered 2021-11-16 – 2021-11-18 (×3): 40 mg via ORAL
  Filled 2021-11-15 (×3): qty 1

## 2021-11-15 MED ORDER — DARIFENACIN HYDROBROMIDE ER 7.5 MG PO TB24
15.0000 mg | ORAL_TABLET | Freq: Every day | ORAL | Status: DC
Start: 2021-11-16 — End: 2021-11-16

## 2021-11-15 MED ORDER — HEPARIN SODIUM (PORCINE) 5000 UNIT/ML IJ SOLN
5000.0000 [IU] | Freq: Three times a day (TID) | INTRAMUSCULAR | Status: DC
  Administered 2021-11-15 – 2021-11-19 (×10): 5000 [IU] via SUBCUTANEOUS
  Filled 2021-11-15 (×11): qty 1

## 2021-11-15 MED ORDER — SODIUM CHLORIDE 0.9 % IV SOLN
2.0000 g | INTRAVENOUS | Status: DC
  Administered 2021-11-15 – 2021-11-18 (×4): 2 g via INTRAVENOUS
  Filled 2021-11-15 (×4): qty 20

## 2021-11-15 MED ORDER — MORPHINE SULFATE (PF) 2 MG/ML IV SOLN: 2.0000 mg | Freq: Four times a day (QID) | INTRAVENOUS | Status: DC | PRN

## 2021-11-15 MED ORDER — IOHEXOL 300 MG/ML  SOLN
75.0000 mL | Freq: Once | INTRAMUSCULAR | Status: AC | PRN
  Administered 2021-11-15: 75 mL via INTRAVENOUS

## 2021-11-15 MED ORDER — SODIUM CHLORIDE 0.9% FLUSH
3.0000 mL | Freq: Two times a day (BID) | INTRAVENOUS | Status: DC
  Administered 2021-11-15 – 2021-11-19 (×6): 3 mL via INTRAVENOUS

## 2021-11-15 MED ORDER — ACETAMINOPHEN 325 MG PO TABS: 650.0000 mg | ORAL_TABLET | Freq: Four times a day (QID) | ORAL | Status: DC | PRN

## 2021-11-15 MED ORDER — SODIUM CHLORIDE 0.9 % IV SOLN
20.0000 mL/h | INTRAVENOUS | Status: AC
  Administered 2021-11-15 – 2021-11-17 (×3): 20 mL/h via INTRAVENOUS

## 2021-11-15 MED ORDER — METOPROLOL TARTRATE 25 MG PO TABS
25.0000 mg | ORAL_TABLET | Freq: Every day | ORAL | Status: DC
  Administered 2021-11-16 – 2021-11-18 (×2): 25 mg via ORAL
  Filled 2021-11-15 (×4): qty 1

## 2021-11-15 MED ORDER — SPIRONOLACTONE 25 MG PO TABS
50.0000 mg | ORAL_TABLET | Freq: Every day | ORAL | Status: DC
  Administered 2021-11-16 – 2021-11-17 (×2): 50 mg via ORAL
  Filled 2021-11-15 (×2): qty 2

## 2021-11-15 MED ORDER — SODIUM CHLORIDE 0.9 % IV BOLUS (SEPSIS)
1500.0000 mL | Freq: Once | INTRAVENOUS | Status: AC
  Administered 2021-11-15: 1500 mL via INTRAVENOUS

## 2021-11-15 MED ORDER — ACETAMINOPHEN 650 MG RE SUPP: 650.0000 mg | Freq: Four times a day (QID) | RECTAL | Status: DC | PRN

## 2021-11-15 MED ORDER — ENSURE ENLIVE PO LIQD
237.0000 mL | Freq: Every day | ORAL | Status: DC
  Administered 2021-11-16 – 2021-11-18 (×2): 237 mL via ORAL

## 2021-11-15 MED ORDER — METOCLOPRAMIDE HCL 5 MG/ML IJ SOLN
10.0000 mg | Freq: Once | INTRAMUSCULAR | Status: AC
  Administered 2021-11-15: 10 mg via INTRAVENOUS
  Filled 2021-11-15: qty 2

## 2021-11-15 MED ORDER — LACTATED RINGERS IV SOLN: INTRAVENOUS | Status: AC

## 2021-11-15 NOTE — ED Provider Triage Note (Signed)
Emergency Medicine Provider Triage Evaluation Note ? ?Alexis Nichols , a 76 y.o. female  was evaluated in triage.  Pt complains of nausea, vomiting, body aches x1 day.  Patient had symptoms beginning yesterday.  No reported fevers, chills, congestion, sore throat, cough.  Patient has had some diarrhea.  No urinary changes.. ? ?Review of Systems  ?Positive: Nausea, vomiting, diarrhea, body aches ?Negative: Fever, nasal congestion, sore throat, cough, urinary changes ? ?Physical Exam  ?BP (!) 128/58   Pulse (!) 108   Temp 100 ?F (37.8 ?C) (Oral)   Resp 18   Ht '5\' 2"'$  (1.575 m)   SpO2 97%   BMI 19.61 kg/m?  ?Gen:   Awake, no distress   ?Resp:  Normal effort  ?MSK:   Moves extremities without difficulty  ?Other:  No tenderness to abdomen on palpation ? ?Medical Decision Making  ?Medically screening exam initiated at 6:03 PM.  Appropriate orders placed.  Alexis Nichols was informed that the remainder of the evaluation will be completed by another provider, this initial triage assessment does not replace that evaluation, and the importance of remaining in the ED until their evaluation is complete. ? ?Patient presented with nausea, vomiting, body aches x24 hours.  Patient will have labs at this time. ?  ?Alexis Moll, PA-C ?11/15/21 1803 ? ?

## 2021-11-15 NOTE — Assessment & Plan Note (Addendum)
-  replete °

## 2021-11-15 NOTE — Sepsis Progress Note (Signed)
Following per sepsis protocol   

## 2021-11-15 NOTE — Assessment & Plan Note (Addendum)
-  PPI BID ?-EGD: gastric stenosis: will need soft diet and then repeat EGD in 2 months  ?Colonoscopy: One 15 mm polyp in the sigmoid colon, removed with a  ?                       hot snare. Resected and retrieved. Clips (MR  ?                       conditional) were placed. ?                       - Severe diverticulosis in the recto-sigmoid colon, in  ?                       the sigmoid colon  ? ?

## 2021-11-15 NOTE — H&P (Signed)
?History and Physical  ? ? ?Patient: Alexis Nichols TML:[id number] DOB: 1945-12-06 ?DOA: 11/15/2021 ?DOS: the patient was seen and examined on 11/16/2021 ?PCP: Marguerita Merles, MD  ?Patient coming from: Home ? ?Chief Complaint:  ?Chief Complaint  ?Patient presents with  ? Emesis  ? ?HPI: Alexis Nichols is a 76 y.o. female with medical history significant of CKD, hypertension, GERD. ?Patient coming today for nausea vomiting.  Since yesterday and inability to keep anything down.  Patient also had diagnosed for UTI by PCP and started on antibiotics.  Recently found to have atrial fibrillation. ?In the emergency room patient meets sepsis criteria and blood work shows mild hypokalemia with a potassium of 3.3 creatinine 1.14 white count of 21.  EKG shows sinus rhythm  at 103 with multiple PVCs and artifact. ?Patient had a CT of the abdomen and pelvis with contrast in the emergency room which showed:  ? IMPRESSION: 1. Prominent gastric distension, with chronic circumferential wall thickening in the region of the pylorus. This persists on delayed imaging, and endoscopy may be useful to evaluate for underlying scarring or mass lesion resulting in gastric outlet obstruction. 2. Distal colonic diverticulosis without diverticulitis. 3. 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 Please correlate with any symptoms of left lower extremity claudication. 4.  Aortic Atherosclerosis (ICD10-I70.0). Electronically Signed   By: Randa Ngo M.D.   On: 11/15/2021 21:58   ?Patient's EKG last visit on 11/01/2021 shows sinus bradycardia with PACs left axis deviation, inferior infarct and anterolateral infarct and shortened QT interpreted by cardiology. ?Patient's EKG today is very difficult to interpret it is sinus with intermittent PVCs auscultation is regular multiple artifacts on EKG. ?Patient is seen cardiology on 18 April  of this year and is scheduled to have evaluation for her ongoing palpitations and shortness of breath. ?Yesterday chart review shows a procedure visit showing baseline is normal sinus rhythm with a max heart rate of 162 with frequent premature atrial contractions occasional PVCs but no evidence of symptomatic bradycardia or advanced heart block or malignant tachycardia per cardiology note. ? ?Review of Systems  ?Respiratory:  Positive for shortness of breath.   ?Gastrointestinal:  Positive for abdominal pain, nausea and vomiting.  ?All other systems reviewed and are negative. ? ?Past Medical History:  ?Diagnosis Date  ? Acoustic neuroma ([place])   ? CKD (chronic kidney disease)   ? Fracture of tibial shaft, left, closed 07/14/2019  ? GERD (gastroesophageal reflux disease)   ? HTN (hypertension)   ? Osteoporosis   ? Vitamin D insufficiency 07/14/2019  ? ?Past Surgical History:  ?Procedure Laterality Date  ? BRAIN TUMOR EXCISION  11/2018  ? excision of acoustic neuroma   ? EXTERNAL FIXATION LEG Left 07/12/2019  ? Procedure: EXTERNAL FIXATION LEG;  Surgeon: Marchia Bond, MD;  Location: [place];  Service: Orthopedics;  Laterality: Left;  ? EXTERNAL FIXATION REMOVAL Left 07/15/2019  ? Procedure: Removal External Fixation Leg;  Surgeon: Altamese Wainaku, MD;  Location: [place];  Service: Orthopedics;  Laterality: Left;  ? ORIF TIBIA FRACTURE Left 07/15/2019  ? Procedure: OPEN REDUCTION INTERNAL FIXATION (ORIF) TIBIA FRACTURE;  Surgeon: Altamese [place], MD;  Location: [place];  Service: Orthopedics;  Laterality: Left;  ? ORIF TIBIA PLATEAU Left 07/15/2019  ? Procedure: OPEN REDUCTION INTERNAL FIXATION (ORIF) TIBIAL PLATEAU;  Surgeon: Altamese [place], MD;  Location: [place];  Service: Orthopedics;  Laterality: Left;  ? ?  Social History:  reports that she has never smoked. She has never used smokeless tobacco. She reports that she does not drink alcohol. No history on file for drug use. ? ?No Known Allergies ? ?No family history on  file. ? ?Prior to Admission medications   ?Medication Sig Start Date End Date Taking? Authorizing Provider  ?acetaminophen (TYLENOL) 500 MG tablet Take 1 tablet (500 mg total) by mouth every 12 (twelve) hours. 07/24/19   Ainsley Spinner, PA-C  ?docusate sodium (COLACE) 100 MG capsule Take 1 capsule (100 mg total) by mouth 2 (two) times daily. 07/24/19   Ainsley Spinner, PA-C  ?feeding supplement, ENSURE ENLIVE, (ENSURE ENLIVE) LIQD Take 237 mLs by mouth daily at 8 pm. 07/24/19   Ainsley Spinner, PA-C  ?metoprolol tartrate (LOPRESSOR) 25 MG tablet Take 25 mg by mouth daily.  01/30/19   [provider]  ?Multiple Vitamins-Minerals (PRESERVISION AREDS PO) Take by mouth.    [provider]  ?omeprazole (PRILOSEC) 40 MG capsule Take 40 mg by mouth daily. 07/03/19   [provider]  ?solifenacin (VESICARE) 10 MG tablet Take 10 mg by mouth daily.     [provider]  ?spironolactone (ALDACTONE) 50 MG tablet Take 50 mg by mouth daily.  01/30/19   [provider]  ?triamcinolone cream (KENALOG) 0.1 % Apply to eczema skin qd-bid up to 5 days a week prn,Avoid applying to face,neck, groin, and axilla. Use as directed. Long-term use can cause thinning of the skin. 11/10/21   Ralene Bathe, MD  ?vitamin B-12 (CYANOCOBALAMIN) 1000 MCG tablet Take 1,000 mcg by mouth daily.    [provider]  ? ? ?Physical Exam: ?Vitals:  ? 11/15/21 2128 11/15/21 2130 11/15/21 2200 11/16/21 0015  ?BP: (!) 147/63 134/61 (!) 143/63 (!) 134/58  ?Pulse: 97 77 75 72  ?Resp: (!) 24 (!) 25 12 (!) 23  ?Temp:      ?TempSrc:      ?SpO2: 92% 100% 100% 97%  ?Height:      ?Physical Exam ?Vitals and nursing note reviewed.  ?Constitutional:   ?   General: She is not in acute distress. ?   Appearance: Normal appearance. She is not ill-appearing, toxic-appearing or diaphoretic.  ?HENT:  ?   Head: Normocephalic and atraumatic.  ?   Right Ear: Hearing and external ear normal.  ?   Left Ear: Hearing and external ear normal.  ?    Nose: Nose normal. No nasal deformity.  ?   Mouth/Throat:  ?   Lips: Pink.  ?   Mouth: Mucous membranes are moist.  ?   Tongue: No lesions.  ?   Pharynx: Oropharynx is clear.  ?Eyes:  ?   Extraocular Movements: Extraocular movements intact.  ?   Pupils: Pupils are equal, round, and reactive to light.  ?Cardiovascular:  ?   Rate and Rhythm: Normal rate and regular rhythm.  ?   Pulses: Normal pulses.  ?   Heart sounds: Normal heart sounds.  ?Pulmonary:  ?   Effort: Pulmonary effort is normal.  ?   Breath sounds: Wheezing present.  ?Abdominal:  ?   General: Bowel sounds are normal. There is no distension.  ?   Palpations: Abdomen is soft. There is no mass.  ?   Tenderness: There is no abdominal tenderness. There is no guarding.  ?   Hernia: No hernia is present.  ?Musculoskeletal:  ?   Right lower leg: No edema.  ?   Left lower leg: No  edema.  ?Skin: ?   General: Skin is warm.  ?Neurological:  ?   General: No focal deficit present.  ?   Mental Status: She is alert and oriented to person, place, and time.  ?   Cranial Nerves: Cranial nerves 2-12 are intact.  ?   Motor: Motor function is intact.  ?Psychiatric:     ?   Attention and Perception: Attention normal.     ?   Mood and Affect: Mood normal.     ?   Speech: Speech normal.     ?   Behavior: Behavior normal. Behavior is cooperative.     ?   Cognition and Memory: Cognition normal.  ? ? ?Data Reviewed: ?Results for orders placed or performed during the hospital encounter of 11/15/21 (from the past 24 hour(s))  ?Lipase, blood     Status: None  ? Collection Time: 11/15/21  6:00 PM  ?Result Value Ref Range  ? Lipase 41 11 - 51 U/L  ?Comprehensive metabolic panel     Status: Abnormal  ? Collection Time: 11/15/21  6:00 PM  ?Result Value Ref Range  ? Sodium 142 135 - 145 mmol/L  ? Potassium 3.3 (L) 3.5 - 5.1 mmol/L  ? Chloride 94 (L) 98 - 111 mmol/L  ? CO2 33 (H) 22 - 32 mmol/L  ? Glucose, Bld 139 (H) 70 - 99 mg/dL  ? BUN 25 (H) 8 - 23 mg/dL  ? Creatinine, Ser 1.14 (H) 0.44  - 1.00 mg/dL  ? Calcium 10.1 8.9 - 10.3 mg/dL  ? Total Protein 6.9 6.5 - 8.1 g/dL  ? Albumin 3.9 3.5 - 5.0 g/dL  ? AST 29 15 - 41 U/L  ? ALT 14 0 - 44 U/L  ? Alkaline Phosphatase 109 38 - 126 U/L  ? Total Bil

## 2021-11-15 NOTE — ED Provider Notes (Signed)
? ?[facility] ?Provider Note ? ? ? Event Date/Time  ? First MD Initiated Contact with Patient 11/15/21 2109   ?  (approximate) ? ? ?History  ? ?Emesis ? ? ?HPI ? ?Alexis Nichols is a 76 y.o. female who presents to the ED for evaluation of Emesis ?  ?I reviewed initial cardiology consult from 4/18.  History of CKD, GERD ? ?Patient presents to the ED for evaluation of about 24 hours of nausea and vomiting.  Yesterday afternoon, she reports postprandial nausea and emesis that has been persistent despite inability to keep any other food down.  She reports 5-10 episodes of nonbloody nonbilious emesis. ? ?Reports she has some dysuria over the weekend and was started on antibiotics for a UTI by her PCP.  She reports taking a single dose of medication that she is supposed to take twice daily, does not know the name of it. ? ?Physical Exam  ? ?Triage Vital Signs: ?ED Triage Vitals  ?Enc Vitals Group  ?   BP 11/15/21 1801 (!) 128/58  ?   Pulse Rate 11/15/21 1801 (!) 108  ?   Resp 11/15/21 1801 18  ?   Temp 11/15/21 1801 100 ?F (37.8 ?C)  ?   Temp Source 11/15/21 1801 Oral  ?   SpO2 11/15/21 1801 97 %  ?   Weight --   ?   Height 11/15/21 1758 '5\' 2"'$  (1.575 m)  ?   Head Circumference --   ?   Peak Flow --   ?   Pain Score 11/15/21 1758 0  ?   Pain Loc --   ?   Pain Edu? --   ?   Excl. in [place]? --   ? ? ?Most recent vital signs: ?Vitals:  ? 11/15/21 2130 11/15/21 2200  ?BP: 134/61 (!) 143/63  ?Pulse: 77 75  ?Resp: (!) 25 12  ?Temp:    ?SpO2: 100% 100%  ? ? ?General: Awake, no distress.  Pleasant and conversational ?CV:  Good peripheral perfusion.  Tachycardic and regular ?Resp:  Normal effort.  ?Abd:  No distention.  Mild suprapubic tenderness, otherwise benign.  No peritoneal features. ?MSK:  No deformity noted.  ?Neuro:  No focal deficits appreciated. ?Other:   ? ? ?ED Results / Procedures / Treatments  ? ?Labs ?(all labs ordered are listed, but only abnormal results are displayed) ?Labs Reviewed   ?COMPREHENSIVE METABOLIC PANEL - Abnormal; Notable for the following components:  ?    Result Value  ? Potassium 3.3 (*)   ? Chloride 94 (*)   ? CO2 33 (*)   ? Glucose, Bld 139 (*)   ? BUN 25 (*)   ? Creatinine, Ser 1.14 (*)   ? GFR, Estimated 50 (*)   ? All other components within normal limits  ?CBC - Abnormal; Notable for the following components:  ? WBC 21.1 (*)   ? All other components within normal limits  ?URINALYSIS, ROUTINE W REFLEX MICROSCOPIC - Abnormal; Notable for the following components:  ? Color, Urine YELLOW (*)   ? APPearance HAZY (*)   ? pH 9.0 (*)   ? Ketones, ur 20 (*)   ? Protein, ur 30 (*)   ? Leukocytes,Ua SMALL (*)   ? Bacteria, UA RARE (*)   ? All other components within normal limits  ?LACTIC ACID, PLASMA - Abnormal; Notable for the following components:  ? Lactic Acid, Venous 2.3 (*)   ? All other components within normal limits  ?  TROPONIN I (HIGH SENSITIVITY) - Abnormal; Notable for the following components:  ? Troponin I (High Sensitivity) 24 (*)   ? All other components within normal limits  ?CULTURE, BLOOD (SINGLE)  ?URINE CULTURE  ?LIPASE, BLOOD  ?PROCALCITONIN  ?PROCALCITONIN  ?LACTIC ACID, PLASMA  ?CBC  ?CREATININE, SERUM  ?COMPREHENSIVE METABOLIC PANEL  ?CBC  ?TROPONIN I (HIGH SENSITIVITY)  ? ? ?EKG ?Poor quality EKG demonstrates a narrow complex irregular rhythm that may be sinus.  Multiple PVCs.  Tremulous baseline.  No clear evidence of STEMI. ? ?RADIOLOGY ?CT abdomen/pelvis reviewed by me with distended stomach, but no signs of SBO ?CXR reviewed by me without evidence of acute cardiopulmonary pathology. ? ?Official radiology report(s): ?CT ABDOMEN PELVIS W CONTRAST ? ?Result Date: 11/15/2021 ?CLINICAL DATA:  Nausea and vomiting since yesterday EXAM: CT ABDOMEN AND PELVIS WITH CONTRAST TECHNIQUE: Multidetector CT imaging of the abdomen and pelvis was performed using the standard protocol following bolus administration of intravenous contrast. RADIATION DOSE REDUCTION: This exam  was performed according to the departmental dose-optimization program which includes automated exposure control, adjustment of the mA and/or kV according to patient size and/or use of iterative reconstruction technique. CONTRAST:  58m OMNIPAQUE IOHEXOL 300 MG/ML  SOLN COMPARISON:  02/06/2020 FINDINGS: Lower chest: No acute pleural or parenchymal lung disease. Hepatobiliary: No focal liver abnormality is seen. No gallstones, gallbladder wall thickening, or biliary dilatation. Pancreas: Unremarkable. No pancreatic ductal dilatation or surrounding inflammatory changes. Spleen: Normal in size without focal abnormality. Adrenals/Urinary Tract: Bilateral renal cortical thinning. Otherwise the kidneys enhance normally and symmetrically. No urinary tract calculi or obstructive uropathy. The adrenals and bladder are unremarkable. Stomach/Bowel: No bowel obstruction or ileus. Normal appendix right lower quadrant. Scattered diverticulosis within the descending and sigmoid colon without evidence of acute diverticulitis. There is prominent gastric distension, with chronic circumferential wall thickening in the region of the gastric pylorus. If not previously evaluated, endoscopy may be useful. Vascular/Lymphatic: Aortic atherosclerosis. 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 No pathologic adenopathy within the abdomen or pelvis. Reproductive: Uterus and bilateral adnexa are unremarkable. Other: No free fluid or free intraperitoneal gas. No abdominal wall hernia. Musculoskeletal: No acute or destructive bony lesions. Reconstructed images demonstrate chronic anterior wedging of the lower thoracic spine with prominent Schmorl's nodes from T8 through T10. IMPRESSION: 1. Prominent gastric distension, with chronic circumferential wall thickening in the region of the pylorus. This persists on delayed imaging, and  endoscopy may be useful to evaluate for underlying scarring or mass lesion resulting in gastric outlet obstruction. 2. Distal colonic diverticulosis without diverticulitis. 3. 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 Please correlate with any symptoms of left lower extremity claudication. 4.  Aortic Atherosclerosis (ICD10-I70.0). Electronically Signed   By: MRanda NgoM.D.   On: 11/15/2021 21:58  ? ?DG Chest Portable 1 View ? ?Result Date: 11/15/2021 ?CLINICAL DATA:  Sepsis, vomiting, diarrhea EXAM: PORTABLE CHEST 1 VIEW COMPARISON:  12/23/2011 FINDINGS: Single frontal view of the chest demonstrates an unremarkable cardiac silhouette. No acute airspace disease, effusion, or pneumothorax. Severe degenerative changes of the bilateral shoulders, left greater than right, stable. No acute fracture. IMPRESSION: 1. No acute intrathoracic process. Electronically Signed   By: MRanda NgoM.D.   On: 11/15/2021 22:45   ? ?PROCEDURES and INTERVENTIONS: ? ?.1-3 Lead EKG Interpretation ?Performed  by: Vladimir Crofts, MD ?Authorized by: Vladimir Crofts, MD  ? ?  Interpretation: abnormal   ?  ECG rate:  103 ?  ECG rate assessment: tachycardic   ?  Rhythm: sinus tachycardia   ?  Ectopy: none   ?  Conduction: normal   ?.Critical Care ?Performed by: Vladimir Crofts, MD ?Authorized by: Vladimir Crofts, MD  ? ?Critical care provider statement:  ?  Critical care time (minutes):  30 ?  Critical care time was exclusive of:  Separately billable procedures and treating other patients ?  Critical care was necessary to treat or prevent imminent or life-threatening deterioration of the following conditions:  Sepsis ?  Critical care was time spent personally by me on the following activities:  Development of treatment plan with patient or surrogate, discussions with consultants, evaluation of patient's response to treatment,  examination of patient, ordering and review of laboratory studies, ordering and review of radiographic studies, ordering and performing treatments and interventions, pulse oximetry, re-evaluation of patient's condit

## 2021-11-15 NOTE — Assessment & Plan Note (Addendum)
resolved 

## 2021-11-15 NOTE — Consult Note (Signed)
CODE SEPSIS - PHARMACY COMMUNICATION ? ?**Broad Spectrum Antibiotics should be administered within 1 hour of Sepsis diagnosis** ? ?Time Code Sepsis Called/Page Received: 2136 ? ?Antibiotics Ordered: rocephin ? ?Time of 1st antibiotic administration: 2156 ? ?Additional action taken by pharmacy: none ? ?If necessary, Name of Provider/Nurse Contacted: n/a ? ? ? ?Pearla Dubonnet ,PharmD ?Clinical Pharmacist  ?11/15/2021  10:04 PM ? ?

## 2021-11-15 NOTE — Assessment & Plan Note (Addendum)
-  urine cultures with multiple species ?-resume home treatment ?

## 2021-11-15 NOTE — Assessment & Plan Note (Addendum)
-   aldactone and lopressor.  ? ?

## 2021-11-15 NOTE — ED Notes (Signed)
First nurse note-pt brought in via ems from home with n/v since yesterday.   Pt has iv lfa 20 g per ems.  Zofran '4mg'$  and 500 nsaline given by ems.  Hx afib new onset last week.  Pt alert, sitting in wheelchair in lobby.  99.5 oral, p-70, 120/48 per ems.   ?

## 2021-11-15 NOTE — ED Triage Notes (Addendum)
Patient to ER from home. Reports being unable to keep anything down since yesterday around 1300. Reports some diarrhea. Denies abdominal pain. Reports that she has a lady that comes to help her that is possibly sick too. Also reports eating some leftovers from her church.  ? ?Patient HOH. ?

## 2021-11-16 ENCOUNTER — Encounter: Payer: Self-pay | Admitting: Internal Medicine

## 2021-11-16 DIAGNOSIS — N3000 Acute cystitis without hematuria: Secondary | ICD-10-CM

## 2021-11-16 DIAGNOSIS — N189 Chronic kidney disease, unspecified: Secondary | ICD-10-CM

## 2021-11-16 DIAGNOSIS — R002 Palpitations: Secondary | ICD-10-CM | POA: Diagnosis not present

## 2021-11-16 DIAGNOSIS — R112 Nausea with vomiting, unspecified: Secondary | ICD-10-CM | POA: Diagnosis not present

## 2021-11-16 DIAGNOSIS — E876 Hypokalemia: Secondary | ICD-10-CM

## 2021-11-16 DIAGNOSIS — K311 Adult hypertrophic pyloric stenosis: Secondary | ICD-10-CM

## 2021-11-16 DIAGNOSIS — I10 Essential (primary) hypertension: Secondary | ICD-10-CM

## 2021-11-16 LAB — CBC
HCT: 40.2 % (ref 36.0–46.0)
Hemoglobin: 13 g/dL (ref 12.0–15.0)
MCH: 30 pg (ref 26.0–34.0)
MCHC: 32.3 g/dL (ref 30.0–36.0)
MCV: 92.8 fL (ref 80.0–100.0)
Platelets: 270 10*3/uL (ref 150–400)
RBC: 4.33 MIL/uL (ref 3.87–5.11)
RDW: 13.5 % (ref 11.5–15.5)
WBC: 17 10*3/uL — ABNORMAL HIGH (ref 4.0–10.5)
nRBC: 0 % (ref 0.0–0.2)

## 2021-11-16 LAB — COMPREHENSIVE METABOLIC PANEL
ALT: 13 U/L (ref 0–44)
AST: 23 U/L (ref 15–41)
Albumin: 3.4 g/dL — ABNORMAL LOW (ref 3.5–5.0)
Alkaline Phosphatase: 91 U/L (ref 38–126)
Anion gap: 11 (ref 5–15)
BUN: 22 mg/dL (ref 8–23)
CO2: 31 mmol/L (ref 22–32)
Calcium: 8.9 mg/dL (ref 8.9–10.3)
Chloride: 100 mmol/L (ref 98–111)
Creatinine, Ser: 0.98 mg/dL (ref 0.44–1.00)
GFR, Estimated: 59 mL/min — ABNORMAL LOW (ref >60)
Glucose, Bld: 125 mg/dL — ABNORMAL HIGH (ref 70–99)
Potassium: 3.2 mmol/L — ABNORMAL LOW (ref 3.5–5.1)
Sodium: 142 mmol/L (ref 135–145)
Total Bilirubin: 0.8 mg/dL (ref 0.3–1.2)
Total Protein: 6 g/dL — ABNORMAL LOW (ref 6.5–8.1)

## 2021-11-16 LAB — D-DIMER, QUANTITATIVE: D-Dimer, Quant: 2.61 ug/mL-FEU — ABNORMAL HIGH (ref 0.00–0.50)

## 2021-11-16 LAB — BASIC METABOLIC PANEL
Anion gap: 6 (ref 5–15)
BUN: 15 mg/dL (ref 8–23)
CO2: 32 mmol/L (ref 22–32)
Calcium: 8.5 mg/dL — ABNORMAL LOW (ref 8.9–10.3)
Chloride: 101 mmol/L (ref 98–111)
Creatinine, Ser: 0.84 mg/dL (ref 0.44–1.00)
GFR, Estimated: >60 mL/min (ref >60)
Glucose, Bld: 121 mg/dL — ABNORMAL HIGH (ref 70–99)
Potassium: 3.2 mmol/L — ABNORMAL LOW (ref 3.5–5.1)
Sodium: 139 mmol/L (ref 135–145)

## 2021-11-16 LAB — PROCALCITONIN
Procalcitonin: <0.1 ng/mL
Procalcitonin: <0.1 ng/mL

## 2021-11-16 LAB — LACTIC ACID, PLASMA: Lactic Acid, Venous: 1.8 mmol/L (ref 0.5–1.9)

## 2021-11-16 LAB — TROPONIN I (HIGH SENSITIVITY): Troponin I (High Sensitivity): 26 ng/L — ABNORMAL HIGH (ref <18)

## 2021-11-16 MED ORDER — OXYBUTYNIN CHLORIDE 5 MG PO TABS
5.0000 mg | ORAL_TABLET | Freq: Three times a day (TID) | ORAL | Status: DC
  Administered 2021-11-16 – 2021-11-19 (×10): 5 mg via ORAL
  Filled 2021-11-16 (×11): qty 1

## 2021-11-16 MED ORDER — ALUM & MAG HYDROXIDE-SIMETH 200-200-20 MG/5ML PO SUSP
30.0000 mL | ORAL | Status: DC | PRN
  Administered 2021-11-16 – 2021-11-18 (×4): 30 mL via ORAL
  Filled 2021-11-16 (×5): qty 30

## 2021-11-16 NOTE — Progress Notes (Signed)
Admission profile updated. ?

## 2021-11-16 NOTE — ED Notes (Signed)
Sent msg to attending re: d dimer elevated at 2.61. ?

## 2021-11-16 NOTE — ED Notes (Signed)
Per IP nurse room is actually still being cleaned she will let us know when room is clean. ?

## 2021-11-16 NOTE — Assessment & Plan Note (Addendum)
-   seeing cardiology outpatient ?-replete K ?

## 2021-11-16 NOTE — Progress Notes (Addendum)
Patient complaint of food "getting stuck" while trying to eat dinner. SLP ordered. Patient requested Maalox, PRN Maalox given per [facility] ?

## 2021-11-17 DIAGNOSIS — A419 Sepsis, unspecified organism: Principal | ICD-10-CM

## 2021-11-17 DIAGNOSIS — K2210 Ulcer of esophagus without bleeding: Secondary | ICD-10-CM | POA: Diagnosis present

## 2021-11-17 DIAGNOSIS — I493 Ventricular premature depolarization: Secondary | ICD-10-CM | POA: Diagnosis present

## 2021-11-17 DIAGNOSIS — N182 Chronic kidney disease, stage 2 (mild): Secondary | ICD-10-CM | POA: Diagnosis present

## 2021-11-17 DIAGNOSIS — N390 Urinary tract infection, site not specified: Secondary | ICD-10-CM | POA: Diagnosis present

## 2021-11-17 DIAGNOSIS — R195 Other fecal abnormalities: Secondary | ICD-10-CM | POA: Diagnosis not present

## 2021-11-17 DIAGNOSIS — N189 Chronic kidney disease, unspecified: Secondary | ICD-10-CM | POA: Diagnosis not present

## 2021-11-17 DIAGNOSIS — R1319 Other dysphagia: Secondary | ICD-10-CM

## 2021-11-17 DIAGNOSIS — I4891 Unspecified atrial fibrillation: Secondary | ICD-10-CM | POA: Diagnosis present

## 2021-11-17 DIAGNOSIS — R002 Palpitations: Secondary | ICD-10-CM | POA: Diagnosis present

## 2021-11-17 DIAGNOSIS — K317 Polyp of stomach and duodenum: Secondary | ICD-10-CM | POA: Diagnosis present

## 2021-11-17 DIAGNOSIS — K5730 Diverticulosis of large intestine without perforation or abscess without bleeding: Secondary | ICD-10-CM | POA: Diagnosis present

## 2021-11-17 DIAGNOSIS — K311 Adult hypertrophic pyloric stenosis: Secondary | ICD-10-CM | POA: Diagnosis present

## 2021-11-17 DIAGNOSIS — I129 Hypertensive chronic kidney disease with stage 1 through stage 4 chronic kidney disease, or unspecified chronic kidney disease: Secondary | ICD-10-CM | POA: Diagnosis present

## 2021-11-17 DIAGNOSIS — K635 Polyp of colon: Secondary | ICD-10-CM | POA: Diagnosis present

## 2021-11-17 DIAGNOSIS — M810 Age-related osteoporosis without current pathological fracture: Secondary | ICD-10-CM | POA: Diagnosis present

## 2021-11-17 DIAGNOSIS — E876 Hypokalemia: Secondary | ICD-10-CM | POA: Diagnosis present

## 2021-11-17 DIAGNOSIS — B3781 Candidal esophagitis: Secondary | ICD-10-CM | POA: Diagnosis present

## 2021-11-17 DIAGNOSIS — Z79899 Other long term (current) drug therapy: Secondary | ICD-10-CM | POA: Diagnosis not present

## 2021-11-17 DIAGNOSIS — R112 Nausea with vomiting, unspecified: Secondary | ICD-10-CM | POA: Diagnosis present

## 2021-11-17 DIAGNOSIS — K259 Gastric ulcer, unspecified as acute or chronic, without hemorrhage or perforation: Secondary | ICD-10-CM | POA: Diagnosis present

## 2021-11-17 DIAGNOSIS — I10 Essential (primary) hypertension: Secondary | ICD-10-CM | POA: Diagnosis not present

## 2021-11-17 DIAGNOSIS — R1013 Epigastric pain: Secondary | ICD-10-CM | POA: Diagnosis not present

## 2021-11-17 DIAGNOSIS — K219 Gastro-esophageal reflux disease without esophagitis: Secondary | ICD-10-CM | POA: Diagnosis present

## 2021-11-17 DIAGNOSIS — R1314 Dysphagia, pharyngoesophageal phase: Secondary | ICD-10-CM | POA: Diagnosis present

## 2021-11-17 LAB — BASIC METABOLIC PANEL
Anion gap: 6 (ref 5–15)
BUN: 13 mg/dL (ref 8–23)
CO2: 28 mmol/L (ref 22–32)
Calcium: 8.7 mg/dL — ABNORMAL LOW (ref 8.9–10.3)
Chloride: 108 mmol/L (ref 98–111)
Creatinine, Ser: 0.83 mg/dL (ref 0.44–1.00)
GFR, Estimated: >60 mL/min (ref >60)
Glucose, Bld: 105 mg/dL — ABNORMAL HIGH (ref 70–99)
Potassium: 3.9 mmol/L (ref 3.5–5.1)
Sodium: 142 mmol/L (ref 135–145)

## 2021-11-17 LAB — CBC
HCT: 37.4 % (ref 36.0–46.0)
Hemoglobin: 12 g/dL (ref 12.0–15.0)
MCH: 29.9 pg (ref 26.0–34.0)
MCHC: 32.1 g/dL (ref 30.0–36.0)
MCV: 93.3 fL (ref 80.0–100.0)
Platelets: 244 10*3/uL (ref 150–400)
RBC: 4.01 MIL/uL (ref 3.87–5.11)
RDW: 13.6 % (ref 11.5–15.5)
WBC: 11.5 10*3/uL — ABNORMAL HIGH (ref 4.0–10.5)
nRBC: 0 % (ref 0.0–0.2)

## 2021-11-17 LAB — PROCALCITONIN: Procalcitonin: <0.1 ng/mL

## 2021-11-17 LAB — URINE CULTURE

## 2021-11-17 MED ORDER — POTASSIUM CHLORIDE CRYS ER 20 MEQ PO TBCR
40.0000 meq | EXTENDED_RELEASE_TABLET | Freq: Once | ORAL | Status: AC
  Administered 2021-11-17: 40 meq via ORAL
  Filled 2021-11-17: qty 2

## 2021-11-17 MED ORDER — POLYETHYLENE GLYCOL 3350 17 GM/SCOOP PO POWD
1.0000 | Freq: Once | ORAL | Status: AC
  Administered 2021-11-17: 255 g via ORAL
  Filled 2021-11-17: qty 255

## 2021-11-17 NOTE — Evaluation (Signed)
Clinical/Bedside Swallow Evaluation ?Patient Details  ?Name: Alexis Nichols ?MRN: [id number] ?Date of Birth: [birth year] ? ?Today's Date: 11/17/2021 ?Time: SLP Start Time (ACUTE ONLY): 1937 SLP Stop Time (ACUTE ONLY): 9024 ?SLP Time Calculation (min) (ACUTE ONLY): 35 min ? ?Past Medical History:  ?Past Medical History:  ?Diagnosis Date  ? Acoustic neuroma ([place])   ? CKD (chronic kidney disease)   ? Fracture of tibial shaft, left, closed 07/14/2019  ? GERD (gastroesophageal reflux disease)   ? HTN (hypertension)   ? Osteoporosis   ? Vitamin D insufficiency 07/14/2019  ? ?Past Surgical History:  ?Past Surgical History:  ?Procedure Laterality Date  ? BRAIN TUMOR EXCISION  11/2018  ? excision of acoustic neuroma   ? EXTERNAL FIXATION LEG Left 07/12/2019  ? Procedure: EXTERNAL FIXATION LEG;  Surgeon: Marchia Bond, MD;  Location: [facility];  Service: Orthopedics;  Laterality: Left;  ? EXTERNAL FIXATION REMOVAL Left 07/15/2019  ? Procedure: Removal External Fixation Leg;  Surgeon: Altamese Gates, MD;  Location: [place];  Service: Orthopedics;  Laterality: Left;  ? ORIF TIBIA FRACTURE Left 07/15/2019  ? Procedure: OPEN REDUCTION INTERNAL FIXATION (ORIF) TIBIA FRACTURE;  Surgeon: Altamese [place], MD;  Location: [place];  Service: Orthopedics;  Laterality: Left;  ? ORIF TIBIA PLATEAU Left 07/15/2019  ? Procedure: OPEN REDUCTION INTERNAL FIXATION (ORIF) TIBIAL PLATEAU;  Surgeon: Altamese Colfax, MD;  Location: [place] [facility];  Service: Orthopedics;  Laterality: Left;  ? ?HPI:  ?Per H&P " Alexis Nichols is a 76 y.o. female with medical history significant of CKD, hypertension, GERD.  Patient coming today for nausea vomiting.  Since yesterday and inability to keep anything down.  Patient also had diagnosed for UTI by PCP and started on antibiotics.  Recently found to have atrial fibrillation.  In the emergency room patient meets sepsis criteria and blood work shows mild hypokalemia with a potassium of 3.3 creatinine 1.14 white count of 21.  EKG shows  sinus rhythm  at 103 with multiple PVCs and artifact.  Patient had a CT of the abdomen and pelvis with contrast in the emergency room which showed:    IMPRESSION: 1. Prominent gastric distension, with chronic circumferential wall thickening in the region of the pylorus. This persists on delayed imaging, and endoscopy may be useful to evaluate for underlying scarring or mass lesion resulting in gastric outlet obstruction. 2. Distal colonic diverticulosis without diverticulitis. 3. 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 Please correlate with any symptoms of left lower extremity claudication. 4.  Aortic Atherosclerosis (ICD10-I70.0). Electronically Signed   By: Randa Ngo M.D.   On: 11/15/2021 21:58    Patient's EKG last visit on 11/01/2021 shows sinus bradycardia with PACs left axis deviation, inferior infarct and anterolateral infarct and shortened QT interpreted by cardiology.  Patient's EKG today is very difficult to interpret it is sinus with intermittent PVCs auscultation is regular multiple artifacts on EKG.  Patient is seen cardiology on 18 April of this year and is scheduled to have evaluation for her ongoing palpitations and shortness of breath.  Yesterday chart review shows a procedure visit showing baseline is normal sinus rhythm with a max heart rate of 162 with frequent premature atrial contractions occasional PVCs but no evidence of symptomatic bradycardia or advanced heart block or malignant tachycardia per cardiology note."  ?  ?Assessment / Plan / Recommendation  ?Clinical Impression ? Pt seen for clincial swallowing evaluation.  Pt alert, pleasant, and cooperative. Endorses worsening c/o globus sensation with solids since episodes of n/v on Monday and Tuesday of this week. Pt report hx of GERD with PPI use and esophageal dilation ~10-12 years ago. Pt also noted consuming  regular solids and thin liquids PTA with preference for well cut meat and naturally softer food given hx of esophageal dysphagia. Cleared with RN. ? ?Pt given trials of solids and thin liquids (via cup and straw) from meal tray. Pt demonstrated an intact oral swallow. Pharyngeal swallow appeared [facility] per clinical assessment. To palpation, pt with seemingly timely swallow initiation and seemingly adequate laryngeal elevation to palpation. No change to vocal quality. Pt with complaints of muffin "going down slow" which subjectively improved with spontaneous sips of liquid wash. Of note, pt with belching and c/o reflux "coming back up" following POs. Pt's complaints appear more c/w an esophageal dysphagia. ? ?Given pt's complaints, pt may benefit from GI consult (acute vs post-acute) for further esophageal work up. ? ?Pt educated at length re: role of SLP vs GI in management of dysphagia, diet recommendations, safe swallowing strategies/aspiration precautions, reflux precautions and SLP POC. Pt verbalized understanding/agreement. RN and MD made aware of results, recommendation, and SLP POC.  ? ?Recommend continuation of regular diet with thin liquids and safe swallowing strategies/aspiration precautions/reflux precautions as outlined below.  ? ?SLP to sign off as pt has no acute SLP needs at this time.  ? ?SLP Visit Diagnosis: Dysphagia, pharyngoesophageal phase (R13.14) ?   ?Aspiration Risk ? Mild aspiration risk;Risk for inadequate nutrition/hydration (from reflux)  ?  ?Diet Recommendation Regular;Thin liquid  ? ?Medication Administration: Whole meds with liquid (consider giving larger pills in puree for ease of swallowing) ?Supervision: Patient able to self feed ?Compensations: Slow rate;Small sips/bites;Follow solids with liquid (REFLUX PRECAUTIONS) ?Postural Changes: Seated upright at 90 degrees (Remain upright 60-90 minutes after POs)  ?  ?Other  Recommendations Recommended Consults: Consider GI  evaluation;Consider esophageal assessment ?Oral Care Recommendations: Oral care BID;Patient independent with oral care   ? ?Recommendations for follow up therapy are one component of a multi-disciplinary discharge planning process, led by the attending physician.  Recommendations may be updated based on patient status, additional functional criteria and insurance authorization. ? ?Follow up Recommendations No SLP follow up  ? ? ?  ?Assistance Recommended at Discharge None  ?Functional Status Assessment Patient has not had a recent decline in their functional status (for oropharyngeal swallow function)  ?Frequency and Duration   ?   ? ?Prognosis Prognosis for Safe Diet Advancement: Fair ?Barriers to Reach Goals:  (hx of esophageal issues)  ? ?  ? ?Swallow Study   ?General HPI: Per H&P " Alexis Nichols is a 76 y.o. female with medical history significant of CKD, hypertension, GERD.  Patient coming today for nausea vomiting.  Since yesterday and inability to keep anything down.  Patient also had diagnosed for UTI by PCP and started on antibiotics.  Recently found to have atrial fibrillation.  In the emergency room patient meets sepsis criteria and blood work shows mild hypokalemia with a potassium of 3.3 creatinine 1.14 white count of 21.  EKG shows sinus rhythm  at 103 with multiple PVCs and artifact.  Patient had a CT of the abdomen and pelvis with contrast in the emergency room which showed:    IMPRESSION: 1. Prominent gastric distension, with chronic circumferential wall thickening in the region of the pylorus. This persists on delayed imaging, and endoscopy may be useful to evaluate  for underlying scarring or mass lesion resulting in gastric outlet obstruction. 2. Distal colonic diverticulosis without diverticulitis. 3. 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 Please correlate with any  symptoms of left lower extremity claudication. 4.  Aortic Atherosclerosis (ICD10-I70.0). Electronically Signed   By: Randa Ngo M.D.   On: 11/15/2021 21:58    Patient's EKG last visit on 11/01/2021 shows sinus bradycardia with

## 2021-11-17 NOTE — Progress Notes (Signed)
?PROGRESS NOTE ? ?SYLIVA MEE RUE:[id number] DOB: 02-Sep-1945 DOA: 11/15/2021 ?PCP: Marguerita Merles, MD ? ?Brief History   ? URIYAH Nichols is a 76 y.o. female with medical history significant of CKD, hypertension, GERD. ?Patient coming today for nausea vomiting.  Since yesterday and inability to keep anything down.  Patient also had diagnosed for UTI by PCP and started on antibiotics.  Recently found to have atrial fibrillation. ?In the emergency room patient meets sepsis criteria and blood work shows mild hypokalemia with a potassium of 3.3 creatinine 1.14 white count of 21.  EKG shows sinus rhythm  at 103 with multiple PVCs and artifact. ?Patient had a CT of the abdomen and pelvis with contrast in the emergency room which showed:  ? IMPRESSION: 1. Prominent gastric distension, with chronic circumferential wall thickening in the region of the pylorus. This persists on delayed imaging, and endoscopy may be useful to evaluate for underlying scarring or mass lesion resulting in gastric outlet obstruction. 2. Distal colonic diverticulosis without diverticulitis. 3. 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 Please correlate with any symptoms of left lower extremity claudication. 4.  Aortic Atherosclerosis (ICD10-I70.0). Electronically Signed   By: Randa Ngo M.D.   On: 11/15/2021 21:58   ?Patient's EKG last visit on 11/01/2021 shows sinus bradycardia with PACs left axis deviation, inferior infarct and anterolateral infarct and shortened QT interpreted by cardiology. ?Patient's EKG today is very difficult to interpret it is sinus with intermittent PVCs auscultation is regular multiple artifacts on EKG. ?Patient is seen cardiology on 18 April of this year and is scheduled to have evaluation for her ongoing palpitations and shortness of breath. ?Yesterday chart review shows a procedure visit showing  baseline is normal sinus rhythm with a max heart rate of 162 with frequent premature atrial contractions occasional PVCs but no evidence of symptomatic bradycardia or advanced heart block or malignant tachycardia per cardiology note. ? ?CT abdomen and pelvis describes prominent gastric distention with chronic circumferential wall thickening in the region of the pylorus possibly due to scarring or mass resulting in gastric outlet obstruction.  ? 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  ? ?Consultants  ? ? ?Procedures  ?NGT in place ? ?Antibiotics  ? ? ?Subjective  ?The patient is resting comfortably. No new complaints. ? ?Objective  ? ?Vitals:  ?Vitals:  ? 11/17/21 0453 11/17/21 0735  ?BP: (!) 109/50 (!) 105/42  ?Pulse: 100 (!) 51  ?Resp: 16 19  ?Temp: 99.4 ?F (37.4 ?C) 98 ?F (36.7 ?C)  ?SpO2: 95% 93%  ? ? ?Exam: ? ?Constitutional:  ?The patient is awake, alert, and oriented x 3. No acute distress. ?Eyes:  ?pupils and irises appear normal ?Normal lids and conjunctivae ?ENMT:  ?grossly normal hearing  ?Lips appear normal ?external ears, nose appear normal ?Oropharynx: mucosa, tongue,posterior pharynx appear normal ?Neck:  ?neck appears normal, no masses, normal ROM, supple ?no thyromegaly ?Respiratory:  ?No increased work of breathing. ?No wheezes, rales, or rhonchi ?No tactile fremitus ?Cardiovascular:  ?Regular rate and rhythm ?No murmurs, ectopy, or gallups. ?No lateral PMI. No thrills. ?Abdomen:  ?Abdomen is soft, non-tender, non-distended ?No hernias, masses, or organomegaly ?Normoactive bowel sounds.  ?Musculoskeletal:  ?No cyanosis, clubbing, or edema ?Skin:  ?No rashes, lesions, ulcers ?palpation of skin: no induration or nodules ?Neurologic:  ?  CN 2-12 intact ?Sensation all 4 extremities intact ?Psychiatric:  ?Mental status ?Mood, affect appropriate ?Orientation to person, place, time  ?judgment  and insight appear intact ? ?I have personally reviewed the following:  ? ?Today's Data  ?Vitals ? ?Lab Data  ?CBC, BMP ? ?Micro Data  ? ? ?Imaging  ?CT abdomen and pelvis ? ?Cardiology Data  ?EKG ? ?Other Data  ? ? ?Scheduled Meds: ? feeding supplement  237 mL Oral Q2000  ? heparin  5,000 Units Subcutaneous Q8H  ? metoprolol tartrate  25 mg Oral Daily  ? oxybutynin  5 mg Oral TID  ? pantoprazole  40 mg Oral Daily  ? potassium chloride  40 mEq Oral Once  ? sodium chloride flush  3 mL Intravenous Q12H  ? spironolactone  50 mg Oral Daily  ? ?Continuous Infusions: ? sodium chloride 20 mL/hr (11/17/21 0618)  ? cefTRIAXone (ROCEPHIN)  IV Stopped (11/16/21 2144)  ? ? ?Active Problems: ?  Nausea & vomiting ?  UTI (urinary tract infection) ?  Palpitation ?  HTN (hypertension) ?  CKD (chronic kidney disease) ?  Sepsis ([place]) ?  Hypokalemia ? ? ?A & P  ?Nausea & vomiting ?Suspect gastric outlet obstruction. GI has been consulted. ?She is receiving PPI and as needed antiemetics. ?  ?Palpitation ?Likely due to issues with gastric outlet obstruction have likely resulted in malabsorption of her medications including metoprol/ Will monitor on telemetry. ?  ?UTI (urinary tract infection) ?Continue iv abx.  ?Follow culture and sensitivity.  ?  ?HTN (hypertension) ?Blood pressure (!) 143/63, pulse 75, temperature 100 ?F (37.8 ?C), temperature source Oral, resp. rate 12, height _0  (1.575 m), SpO2 100 %. ?cont aldactone and lopressor.  ?  ?  ?CKD (chronic kidney disease) ?     ?Lab Results  ?Component Value Date  ?  CREATININE 1.14 (H) 11/15/2021  ?  CREATININE 1.12 (H) 01/22/2020  ?  CREATININE 0.94 07/19/2019  ?avoid contrast studies.  ?renally dose all needled meds.  ?  ?  ?Sepsis ([place]) ?Blood pressure (!) 143/63, pulse 75, temperature 100 ?F (37.8 ?C), temperature source Oral, resp. rate 12, height _1  (1.575 m), SpO2 100 %. Lactic acid 2.3. Criteria for Sepsis not met. She is receiving rocephin. ?  ?Hypokalemia: Supplemented.  Monitor. ? ?I have seen and examined this patient myself. I have spent 34 minutes in her evaluation and care. ? ?DVT prophylaxis: Heparin ?Code Status: Full Code ?Family Communication: None available ?Disposition Plan: Home  ? ? ?Kayleb Warshaw, DO ?[facility] ?Direct contact: see [email or web address]  ?7PM-7AM contact night coverage as above ?11/16/2021, 8:42 AM  LOS: 0 days  ? LOS: 0 days  ? ? ? ? ? ? ?  ?

## 2021-11-17 NOTE — Consult Note (Signed)
? ? ? ?[name], MD ?[address]  ?[address]  ?[place], [place] [address]  ?Main: [phone]  ?Fax: [phone] ?Pager: [phone] ? ? Consultation ? ?Referring Provider:     No ref. provider found ?Primary Care Physician:  Marguerita Merles, MD ?Primary Gastroenterologist:  Dr. Bonna Gains       ?Reason for Consultation:     Nausea, vomiting and difficulty swallowing ? ?Date of Admission:  11/15/2021 ?Date of Consultation:  11/17/2021 ?       ? HPI:   ?Alexis Nichols is a 76 y.o. female history of nausea, vomiting, she is not able to keep anything down and she reported some diarrhea as well.  Apparently, patient was eating some leftovers from her church.  Patient was hemodynamically stable, labs were significant for leukocytosis WBC count 21.1, no evidence of anemia, mild AKI and hypokalemia.  She was initiated on sepsis protocol, underwent CT abdomen pelvis with contrast which revealed prominent gastric distention, with chronic circumferential wall thickening in the region of the pylorus, colonic diverticulosis without diverticulitis.  Patient is being treated for UTI and gastritis and GI is consulted for further evaluation.  When I saw the patient, she reports that she is able to tolerate soft foods.  She was also supposed to undergo colonoscopy in the past for FOBT positive which she deferred at that time.  She does admits to weight loss which has been gradual over the last 1 year ?Her leukocytosis has significantly improved ? ?NSAIDs: None ? ?Antiplts/Anticoagulants/Anti thrombotics: None ? ?GI Procedures: Upper endoscopy several years ago with possible esophageal dilation ? ?Past Medical History:  ?Diagnosis Date  ? Acoustic neuroma ([place])   ? CKD (chronic kidney disease)   ? Fracture of tibial shaft, left, closed 07/14/2019  ? GERD (gastroesophageal reflux disease)   ? HTN (hypertension)   ? Osteoporosis   ? Vitamin D insufficiency 07/14/2019  ? ? ?Past Surgical History:  ?Procedure Laterality Date  ?  BRAIN TUMOR EXCISION  11/2018  ? excision of acoustic neuroma   ? EXTERNAL FIXATION LEG Left 07/12/2019  ? Procedure: EXTERNAL FIXATION LEG;  Surgeon: Marchia Bond, MD;  Location: [place];  Service: Orthopedics;  Laterality: Left;  ? EXTERNAL FIXATION REMOVAL Left 07/15/2019  ? Procedure: Removal External Fixation Leg;  Surgeon: Altamese [place]outh, MD;  Location: [place];  Service: Orthopedics;  Laterality: Left;  ? ORIF TIBIA FRACTURE Left 07/15/2019  ? Procedure: OPEN REDUCTION INTERNAL FIXATION (ORIF) TIBIA FRACTURE;  Surgeon: Altamese [place]field, MD;  Location: [place] [facility];  Service: Orthopedics;  Laterality: Left;  ? ORIF TIBIA PLATEAU Left 07/15/2019  ? Procedure: OPEN REDUCTION INTERNAL FIXATION (ORIF) TIBIAL PLATEAU;  Surgeon: Altamese Lewellen, MD;  Location: [place];  Service: Orthopedics;  Laterality: Left;  ? ? ?Current Facility-Administered Medications:  ?  0.9 %  sodium chloride infusion, 20 mL/hr, Intravenous, Continuous, Florina Ou V, MD, Last Rate: 20 mL/hr at 11/17/21 1055, 20 mL/hr at 11/17/21 1055 ?  acetaminophen (TYLENOL) tablet 650 mg, 650 mg, Oral, Q6H PRN **OR** acetaminophen (TYLENOL) suppository 650 mg, 650 mg, Rectal, Q6H PRN, Para Skeans, MD ?  alum & mag hydroxide-simeth (MAALOX/MYLANTA) 200-200-20 MG/5ML suspension 30 mL, 30 mL, Oral, Q4H PRN, Foust, Katy L, NP, 30 mL at 11/16/21 1745 ?  cefTRIAXone (ROCEPHIN) 2 g in sodium chloride 0.9 % 100 mL IVPB, 2 g, Intravenous, Q24H, Para Skeans, MD, Stopped at 11/16/21 2144 ?  feeding supplement (ENSURE ENLIVE / ENSURE PLUS) liquid 237 mL, 237  mL, Oral, Q2000, Para Skeans, MD, 237 mL at 11/16/21 2045 ?  heparin injection 5,000 Units, 5,000 Units, Subcutaneous, Q8H, Para Skeans, MD, 5,000 Units at 11/17/21 8333 ?  metoprolol tartrate (LOPRESSOR) tablet 25 mg, 25 mg, Oral, Daily, Para Skeans, MD, 25 mg at 11/16/21 0941 ?  morphine (PF) 2 MG/ML injection 2 mg, 2 mg, Intravenous, Q6H PRN, Para Skeans, MD ?  oxybutynin (DITROPAN) tablet 5 mg, 5 mg, Oral,  TID, Swayze, Ava, DO, 5 mg at 11/17/21 0939 ?  pantoprazole (PROTONIX) EC tablet 40 mg, 40 mg, Oral, Daily, Para Skeans, MD, 40 mg at 11/17/21 8329 ?  polyethylene glycol powder (GLYCOLAX/MIRALAX) container 255 g, 1 Container, Oral, Once, Calvin Jablonowski, Tally Due, MD ?  sodium chloride flush (NS) 0.9 % injection 3 mL, 3 mL, Intravenous, Q12H, Florina Ou V, MD, 3 mL at 11/16/21 2143 ?  spironolactone (ALDACTONE) tablet 50 mg, 50 mg, Oral, Daily, Para Skeans, MD, 50 mg at 11/17/21 1916 ? ? ?History reviewed. No pertinent family history.  ? ?Social History  ? ?Tobacco Use  ? Smoking status: Never  ? Smokeless tobacco: Never  ?Substance Use Topics  ? Alcohol use: Never  ? ? ?Allergies as of 11/15/2021  ? (No Known Allergies)  ? ? ?Review of Systems:    ?All systems reviewed and negative except where noted in HPI. ? ? Physical Exam:  ?Vital signs in last 24 hours: ?Temp:  [98 ?F (36.7 ?C)-99.8 ?F (37.7 ?C)] 98 ?F (36.7 ?C) (05/04 0735) ?Pulse Rate:  [51-100] 57 (05/04 0936) ?Resp:  [16-19] 19 (05/04 0735) ?BP: (104-130)/(42-67) 112/66 (05/04 6060) ?SpO2:  [93 %-96 %] 93 % (05/04 0735) ?  ?General: Thin built, pleasant, cooperative in NAD ?Head:  Normocephalic and atraumatic. ?Eyes:   No icterus.   Conjunctiva pink. PERRLA. ?Ears:  Normal auditory acuity. ?Neck:  Supple; no masses or thyroidomegaly ?Lungs: Respirations even and unlabored. Lungs clear to auscultation bilaterally.   No wheezes, crackles, or rhonchi.  ?Heart:  Regular rate and rhythm;  Without murmur, clicks, rubs or gallops ?Abdomen:  Soft, mildly distended, nontender. Normal bowel sounds. No appreciable masses or hepatomegaly.  No rebound or guarding.  ?Rectal:  Not performed. ?Msk:  Symmetrical without gross deformities. generalized weakness ?Extremities:  Without edema, cyanosis or clubbing. ?Neurologic:  Alert and oriented x3;  grossly normal neurologically. ?Skin:  Intact without significant lesions or rashes. ?Psych:  Alert and cooperative. Normal  affect. ? ?LAB RESULTS: ? ?  Latest Ref Rng & Units 11/17/2021  ?  4:35 AM 11/16/2021  ?  5:33 AM 11/15/2021  ?  6:00 PM  ?CBC  ?WBC 4.0 - 10.5 K/uL 11.5   17.0   21.1    ?Hemoglobin 12.0 - 15.0 g/dL 12.0   13.0   14.8    ?Hematocrit 36.0 - 46.0 % 37.4   40.2   45.6    ?Platelets 150 - 400 K/uL 244   270   307    ? ? ?BMET ? ?  Latest Ref Rng & Units 11/17/2021  ?  4:35 AM 11/16/2021  ?  7:54 PM 11/16/2021  ?  5:33 AM  ?BMP  ?Glucose 70 - 99 mg/dL 105   121   125    ?BUN 8 - 23 mg/dL '13   15   22    ' ?Creatinine 0.44 - 1.00 mg/dL 0.83   0.84   0.98    ?Sodium 135 - 145 mmol/L 142  139   142    ?Potassium 3.5 - 5.1 mmol/L 3.9   3.2   3.2    ?Chloride 98 - 111 mmol/L 108   101   100    ?CO2 22 - 32 mmol/L 28   32   31    ?Calcium 8.9 - 10.3 mg/dL 8.7   8.5   8.9    ? ? ?LFT ? ?  Latest Ref Rng & Units 11/16/2021  ?  5:33 AM 11/15/2021  ?  6:00 PM 01/22/2020  ? 11:27 AM  ?Hepatic Function  ?Total Protein 6.5 - 8.1 g/dL 6.0   6.9   7.3    ?Albumin 3.5 - 5.0 g/dL 3.4   3.9   4.3    ?AST 15 - 41 U/L '23   29   22    ' ?ALT 0 - 44 U/L '13   14   17    ' ?Alk Phosphatase 38 - 126 U/L 91   109   98    ?Total Bilirubin 0.3 - 1.2 mg/dL 0.8   1.0   0.6    ? ? ? ?STUDIES: ?CT ABDOMEN PELVIS W CONTRAST ? ?Result Date: 11/15/2021 ?CLINICAL DATA:  Nausea and vomiting since yesterday EXAM: CT ABDOMEN AND PELVIS WITH CONTRAST TECHNIQUE: Multidetector CT imaging of the abdomen and pelvis was performed using the standard protocol following bolus administration of intravenous contrast. RADIATION DOSE REDUCTION: This exam was performed according to the departmental dose-optimization program which includes automated exposure control, adjustment of the mA and/or kV according to patient size and/or use of iterative reconstruction technique. CONTRAST:  39m OMNIPAQUE IOHEXOL 300 MG/ML  SOLN COMPARISON:  02/06/2020 FINDINGS: Lower chest: No acute pleural or parenchymal lung disease. Hepatobiliary: No focal liver abnormality is seen. No gallstones, gallbladder wall  thickening, or biliary dilatation. Pancreas: Unremarkable. No pancreatic ductal dilatation or surrounding inflammatory changes. Spleen: Normal in size without focal abnormality. Adrenals/Urinary Tract: BBuilding control surveyor

## 2021-11-17 NOTE — Progress Notes (Signed)
?PROGRESS NOTE ? ?Alexis Nichols QMG:[id number] DOB: 1945-07-27 DOA: 11/15/2021 ?PCP: Marguerita Merles, MD ? ?Brief History   ? Alexis Nichols is a 76 y.o. female with medical history significant of CKD, hypertension, GERD. ?Patient coming today for nausea vomiting.  Since yesterday and inability to keep anything down.  Patient also had diagnosed for UTI by PCP and started on antibiotics.  Recently found to have atrial fibrillation. ?In the emergency room patient meets sepsis criteria and blood work shows mild hypokalemia with a potassium of 3.3 creatinine 1.14 white count of 21.  EKG shows sinus rhythm  at 103 with multiple PVCs and artifact. ?Patient had a CT of the abdomen and pelvis with contrast in the emergency room which showed:  ? IMPRESSION: 1. Prominent gastric distension, with chronic circumferential wall thickening in the region of the pylorus. This persists on delayed imaging, and endoscopy may be useful to evaluate for underlying scarring or mass lesion resulting in gastric outlet obstruction. 2. Distal colonic diverticulosis without diverticulitis. 3. 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 Please correlate with any symptoms of left lower extremity claudication. 4.  Aortic Atherosclerosis (ICD10-I70.0). Electronically Signed   By: Randa Ngo M.D.   On: 11/15/2021 21:58   ?Patient's EKG last visit on 11/01/2021 shows sinus bradycardia with PACs left axis deviation, inferior infarct and anterolateral infarct and shortened QT interpreted by cardiology. ?Patient's EKG today is very difficult to interpret it is sinus with intermittent PVCs auscultation is regular multiple artifacts on EKG. ?Patient is seen cardiology on 18 April of this year and is scheduled to have evaluation for her ongoing palpitations and shortness of breath. ?Yesterday chart review shows a procedure visit showing  baseline is normal sinus rhythm with a max heart rate of 162 with frequent premature atrial contractions occasional PVCs but no evidence of symptomatic bradycardia or advanced heart block or malignant tachycardia per cardiology note. ? ?CT abdomen and pelvis describes prominent gastric distention with chronic circumferential wall thickening in the region of the pylorus possibly due to scarring or mass resulting in gastric outlet obstruction.  ? 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  ? ?The patient has been evaluated by Dr. Marius Ditch. She will take the patient for EGD and colonoscopy tomorrow. ? ?Consultants  ?Gastroenterology. ? ?Procedures  ?NGT in place ? ?Antibiotics  ? ?Anti-infectives (From admission, onward)  ? ? Start     Dose/Rate Route Frequency Ordered Stop  ? 11/15/21 2145  cefTRIAXone (ROCEPHIN) 2 g in sodium chloride 0.9 % 100 mL IVPB       ? 2 g ?200 mL/hr over 30 Minutes Intravenous Every 24 hours 11/15/21 2132 11/22/21 2159  ? ?  ? ?Subjective  ?The patient is resting comfortably. No new complaints. ? ?Objective  ? ?Vitals:  ?Vitals:  ? 11/17/21 0735 11/17/21 0936  ?BP: (!) 105/42 112/66  ?Pulse: (!) 51 (!) 57  ?Resp: 19   ?Temp: 98 ?F (36.7 ?C)   ?SpO2: 93%   ? ? ?Exam: ? ?Constitutional:  ?The patient is awake, alert, and oriented x 3. No acute distress. ?Eyes:  ?pupils and irises appear normal ?Normal lids and conjunctivae ?ENMT:  ?grossly normal hearing  ?Lips appear normal ?external ears, nose appear normal ?Oropharynx: mucosa, tongue,posterior pharynx appear normal ?Neck:  ?neck appears  normal, no masses, normal ROM, supple ?no thyromegaly ?Respiratory:  ?No increased work of breathing. ?No wheezes, rales, or rhonchi ?No tactile fremitus ?Cardiovascular:  ?Regular rate and rhythm ?No murmurs, ectopy, or gallups. ?No lateral PMI. No thrills. ?Abdomen:  ?Abdomen is soft, non-tender,  non-distended ?No hernias, masses, or organomegaly ?Normoactive bowel sounds.  ?Musculoskeletal:  ?No cyanosis, clubbing, or edema ?Skin:  ?No rashes, lesions, ulcers ?palpation of skin: no induration or nodules ?Neurologic:  ?CN 2-12 intact ?Sensation all 4 extremities intact ?Psychiatric:  ?Mental status ?Mood, affect appropriate ?Orientation to person, place, time  ?judgment and insight appear intact ? ?I have personally reviewed the following:  ? ?Today's Data  ?Vitals ? ?Lab Data  ?CBC, BMP ? ?Micro Data  ?Urine culture: polymmicrobial. ? ?Imaging  ?CT abdomen and pelvis ? ?Cardiology Data  ?EKG ? ?Other Data  ? ? ?Scheduled Meds: ? feeding supplement  237 mL Oral Q2000  ? heparin  5,000 Units Subcutaneous Q8H  ? metoprolol tartrate  25 mg Oral Daily  ? oxybutynin  5 mg Oral TID  ? pantoprazole  40 mg Oral Daily  ? polyethylene glycol powder  1 Container Oral Once  ? sodium chloride flush  3 mL Intravenous Q12H  ? spironolactone  50 mg Oral Daily  ? ?Continuous Infusions: ? sodium chloride 20 mL/hr (11/17/21 1055)  ? cefTRIAXone (ROCEPHIN)  IV Stopped (11/16/21 2144)  ? ? ?Principal Problem: ?  Nausea & vomiting ?Active Problems: ?  UTI (urinary tract infection) ?  Palpitation ?  HTN (hypertension) ?  CKD (chronic kidney disease) ?  Sepsis ([place]) ?  Hypokalemia ? ? ?A & P  ?Nausea & vomiting ?Suspect gastric outlet obstruction. GI has been consulted. ?She is receiving PPI and as needed antiemetics. ?  ?Palpitation ?Likely due to issues with gastric outlet obstruction have likely resulted in malabsorption of her medications including metoprol/ Will monitor on telemetry. ?  ?UTI (urinary tract infection) ?Continue iv abx.  ?Follow culture and sensitivity.  ?  ?HTN (hypertension) ?Blood pressure (!) 143/63, pulse 75, temperature 100 ?F (37.8 ?C), temperature source Oral, resp. rate 12, height _0  (1.575 m), SpO2 100 %. ?cont aldactone and lopressor.  ?  ?  ?CKD (chronic kidney disease) ?     ?Lab Results   ?Component Value Date  ?  CREATININE 1.14 (H) 11/15/2021  ?  CREATININE 1.12 (H) 01/22/2020  ?  CREATININE 0.94 07/19/2019  ?avoid contrast studies.  ?renally dose all needled meds.  ?  ?  ?Sepsis ([place]) ?Blood pressure (!) 143/63, pulse 75, temperature 100 ?F (37.8 ?C), temperature source Oral, resp. rate 12, height _1  (1.575 m), SpO2 100 %. Lactic acid 2.3. Criteria for Sepsis not met. She is receiving rocephin. ?  ?Hypokalemia: Supplemented. Monitor. ? ?I have seen and examined this patient myself. I have spent 34 minutes in her evaluation and care. ? ?DVT prophylaxis: Heparin ?Code Status: Full Code ?Family Communication: None available ?Disposition Plan: Home  ? ? ?Andrey Hoobler, DO ?[facility] ?Direct contact: see [email or web address]  ?7PM-7AM contact night coverage as above ?11/17/2021, 1:51 PM  LOS: 0 days  ? LOS: 0 days  ? ?

## 2021-11-18 ENCOUNTER — Inpatient Hospital Stay: Payer: Medicare HMO | Admitting: Anesthesiology

## 2021-11-18 ENCOUNTER — Encounter
Admission: EM | Disposition: A | Discharge status: Home / Self Care | Payer: Self-pay | Source: Home / Self Care | Attending: Internal Medicine

## 2021-11-18 DIAGNOSIS — K635 Polyp of colon: Secondary | ICD-10-CM | POA: Diagnosis not present

## 2021-11-18 DIAGNOSIS — K2210 Ulcer of esophagus without bleeding: Secondary | ICD-10-CM

## 2021-11-18 DIAGNOSIS — K311 Adult hypertrophic pyloric stenosis: Secondary | ICD-10-CM

## 2021-11-18 DIAGNOSIS — R1013 Epigastric pain: Secondary | ICD-10-CM

## 2021-11-18 DIAGNOSIS — R112 Nausea with vomiting, unspecified: Secondary | ICD-10-CM | POA: Diagnosis not present

## 2021-11-18 DIAGNOSIS — E876 Hypokalemia: Secondary | ICD-10-CM | POA: Diagnosis not present

## 2021-11-18 DIAGNOSIS — K259 Gastric ulcer, unspecified as acute or chronic, without hemorrhage or perforation: Secondary | ICD-10-CM | POA: Diagnosis not present

## 2021-11-18 DIAGNOSIS — R1314 Dysphagia, pharyngoesophageal phase: Secondary | ICD-10-CM

## 2021-11-18 DIAGNOSIS — I10 Essential (primary) hypertension: Secondary | ICD-10-CM | POA: Diagnosis not present

## 2021-11-18 HISTORY — PX: COLONOSCOPY WITH PROPOFOL: SHX5780

## 2021-11-18 HISTORY — PX: ESOPHAGOGASTRODUODENOSCOPY (EGD) WITH PROPOFOL: SHX5813

## 2021-11-18 LAB — KOH PREP: KOH Prep: NONE SEEN

## 2021-11-18 SURGERY — ESOPHAGOGASTRODUODENOSCOPY (EGD) WITH PROPOFOL
Anesthesia: General

## 2021-11-18 MED ORDER — PROPOFOL 10 MG/ML IV BOLUS
INTRAVENOUS | Status: DC | PRN
  Administered 2021-11-18: 10 mg via INTRAVENOUS
  Administered 2021-11-18: 60 mg via INTRAVENOUS

## 2021-11-18 MED ORDER — LIDOCAINE HCL (CARDIAC) PF 100 MG/5ML IV SOSY
PREFILLED_SYRINGE | INTRAVENOUS | Status: DC | PRN
  Administered 2021-11-18: 50 mg via INTRAVENOUS

## 2021-11-18 MED ORDER — LIDOCAINE HCL (CARDIAC) PF 100 MG/5ML IV SOSY: PREFILLED_SYRINGE | INTRAVENOUS | Status: DC | PRN

## 2021-11-18 MED ORDER — PROPOFOL 10 MG/ML IV BOLUS: INTRAVENOUS | Status: DC | PRN

## 2021-11-18 MED ORDER — PROPOFOL 500 MG/50ML IV EMUL
INTRAVENOUS | Status: DC | PRN
  Administered 2021-11-18: 120 ug/kg/min via INTRAVENOUS

## 2021-11-18 MED ORDER — PROPOFOL 500 MG/50ML IV EMUL: INTRAVENOUS | Status: DC | PRN

## 2021-11-18 MED ORDER — PROPOFOL 500 MG/50ML IV EMUL
INTRAVENOUS | Status: AC
  Filled 2021-11-18: qty 50

## 2021-11-18 MED ORDER — PANTOPRAZOLE SODIUM 40 MG PO TBEC
40.0000 mg | DELAYED_RELEASE_TABLET | Freq: Two times a day (BID) | ORAL | Status: DC
  Administered 2021-11-18 – 2021-11-19 (×2): 40 mg via ORAL
  Filled 2021-11-18 (×2): qty 1

## 2021-11-18 MED ORDER — SODIUM CHLORIDE 0.9 % IV SOLN
INTRAVENOUS | Status: DC
  Administered 2021-11-18: 1000 mL via INTRAVENOUS

## 2021-11-18 MED ORDER — GLYCOPYRROLATE 0.2 MG/ML IJ SOLN
INTRAMUSCULAR | Status: DC | PRN
  Administered 2021-11-18: .2 mg via INTRAVENOUS

## 2021-11-18 NOTE — Anesthesia Procedure Notes (Signed)
Procedure Name: Ona ?Date/Time: 11/18/2021 12:18 PM ?Performed by: Jerrye Noble, CRNA ?Pre-anesthesia Checklist: Patient identified, Emergency Drugs available, Suction available and Patient being monitored ?Patient Re-evaluated:Patient Re-evaluated prior to induction ?Oxygen Delivery Method: Nasal cannula ? ? ? ? ?

## 2021-11-18 NOTE — Brief Op Note (Signed)
Esophageal brushing for Candida/KOH sent to lab ?

## 2021-11-18 NOTE — Anesthesia Preprocedure Evaluation (Signed)
Anesthesia Evaluation  ?Patient identified by MRN, date of birth, ID band ?Patient awake ? ? ? ?Reviewed: ?Allergy & Precautions, NPO status , Patient's Chart, lab work & pertinent test results ? ?History of Anesthesia Complications ?Negative for: history of anesthetic complications ? ?Airway ?Mallampati: III ? ?TM Distance: >3 FB ?Neck ROM: full ? ? ? Dental ? ?(+) Missing ?  ?Pulmonary ?neg pulmonary ROS, neg shortness of breath,  ?  ?Pulmonary exam normal ? ? ? ? ? ? ? Cardiovascular ?Exercise Tolerance: Good ?hypertension, (-) angina(-) Past MI Normal cardiovascular exam ? ? ?  ?Neuro/Psych ? Neuromuscular disease negative psych ROS  ? GI/Hepatic ?Neg liver ROS, GERD  Controlled,  ?Endo/Other  ?negative endocrine ROS ? Renal/GU ?Renal disease  ?negative genitourinary ?  ?Musculoskeletal ? ? Abdominal ?  ?Peds ? Hematology ?  ?Anesthesia Other Findings ?Patient is NPO appropriate and reports no nausea or vomiting today or yesterday. ? ? 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 ?Past Surgical History: ?11/2018: BRAIN TUMOR EXCISION ?    Comment:  excision of acoustic neuroma  ?07/12/2019: EXTERNAL FIXATION LEG; Left ?    Comment:  Procedure: EXTERNAL FIXATION LEG;  Surgeon: [name],  ?             Vonna Kotyk, MD;  Location: [place];  Service: Orthopedics;   ?             Laterality: Left; ?07/15/2019: EXTERNAL FIXATION REMOVAL; Left ?    Comment:  Procedure: Removal External Fixation Leg;  Surgeon:  ?             Altamese [place], MD;  Location: [place];  Service:  ?             Orthopedics;  Laterality: Left; ?07/15/2019: ORIF TIBIA FRACTURE; Left ?    Comment:  Procedure: OPEN REDUCTION INTERNAL FIXATION (ORIF) TIBIA ?             FRACTURE;   Surgeon: Altamese [place], MD;  Location: [place]; ?             Service: Orthopedics;  Laterality: Left; ?07/15/2019: ORIF TIBIA PLATEAU; Left ?    Comment:  Procedure: OPEN REDUCTION INTERNAL FIXATION (ORIF)  ?             TIBIAL PLATEAU;  Surgeon: Altamese Eitzen, MD;  Location:  ?             [place];  Service: Orthopedics;  Laterality: Left; ? ?BMI   ? Body Mass Index: 20.48 kg/m?  ?  ? ? Reproductive/Obstetrics ?negative OB ROS ? ?  ? ? ? ? ? ? ? ? ? ? ? ? ? ?  ?  ? ? ? ? ? ? ? ? ?Anesthesia Physical ?Anesthesia Plan ? ?ASA: 3 ? ?Anesthesia Plan: General  ? ?Post-op Pain Management:   ? ?Induction: Intravenous ? ?PONV Risk Score and Plan: Propofol infusion and TIVA ? ?Airway Management Planned: Natural Airway and Nasal Cannula ? ?Additional Equipment:  ? ?Intra-op Plan:  ? ?Post-operative Plan:  ? ?Informed Consent: I have reviewed the patients History and Physical, chart, labs and discussed the procedure including the risks, benefits and alternatives for the proposed anesthesia with the patient or authorized representative who has indicated his/her understanding and acceptance.  ? ? ? ?  Dental Advisory Given ? ?Plan Discussed with: Anesthesiologist, CRNA and Surgeon ? ?Anesthesia Plan Comments: (Patient consented for risks of anesthesia including but not limited to:  ?- adverse reactions to medications ?- risk of airway placement if required ?- damage to eyes, teeth, lips or other oral mucosa ?- nerve damage due to positioning  ?- sore throat or hoarseness ?- Damage to heart, brain, nerves, lungs, other parts of body or loss of life ? ?Patient voiced understanding.)  ? ? ? ? ? ? ?Anesthesia Quick Evaluation ? ?

## 2021-11-18 NOTE — Op Note (Signed)
[facility] ?Gastroenterology ?Patient Name: Alexis Nichols ?Procedure Date: 11/18/2021 12:43 PM ?MRN: [id number] ?Account #: [id number] ?Date of Birth: Dec 29, 1945 ?Admit Type: Outpatient ?Age: 76 ?Room: [facility] ENDO ROOM 2 ?Gender: Female ?Note Status: Finalized ?Instrument Name: Peds Colonoscope [id number] ?Procedure:             Colonoscopy ?Indications:           This is the patient's first colonoscopy, Positive  ?                       fecal immunochemical test ?Providers:             Lin Landsman MD, MD ?Referring MD:          No Local Md, MD (Referring MD) ?Medicines:             General Anesthesia ?Complications:         No immediate complications. Estimated blood loss: None. ?Procedure:             Pre-Anesthesia Assessment: ?                       - Prior to the procedure, a History and Physical was  ?                       performed, and patient medications and allergies were  ?                       reviewed. The patient is competent. The risks and  ?                       benefits of the procedure and the sedation options and  ?                       risks were discussed with the patient. All questions  ?                       were answered and informed consent was obtained.  ?                       Patient identification and proposed procedure were  ?                       verified by the physician, the nurse, the  ?                       anesthesiologist, the anesthetist and the technician  ?                       in the pre-procedure area in the procedure room in the  ?                       endoscopy suite. Mental Status Examination: alert and  ?                       oriented. Airway Examination: normal oropharyngeal  ?                       airway and neck mobility. Respiratory Examination:  ?  clear to auscultation. CV Examination: normal.  ?                       Prophylactic Antibiotics: The patient does not require  ?                       prophylactic  antibiotics. Prior Anticoagulants: The  ?                       patient has taken no previous anticoagulant or  ?                       antiplatelet agents. ASA Grade Assessment: III - A  ?                       patient with severe systemic disease. After reviewing  ?                       the risks and benefits, the patient was deemed in  ?                       satisfactory condition to undergo the procedure. The  ?                       anesthesia plan was to use general anesthesia.  ?                       Immediately prior to administration of medications,  ?                       the patient was re-assessed for adequacy to receive  ?                       sedatives. The heart rate, respiratory rate, oxygen  ?                       saturations, blood pressure, adequacy of pulmonary  ?                       ventilation, and response to care were monitored  ?                       throughout the procedure. The physical status of the  ?                       patient was re-assessed after the procedure. ?                       After obtaining informed consent, the colonoscope was  ?                       passed under direct vision. Throughout the procedure,  ?                       the patient's blood pressure, pulse, and oxygen  ?                       saturations were monitored continuously. The  ?  Colonoscope was introduced through the anus and  ?                       advanced to the the cecum, identified by appendiceal  ?                       orifice and ileocecal valve. The colonoscopy was  ?                       unusually difficult due to significant looping and the  ?                       patient's body habitus. Successful completion of the  ?                       procedure was aided by applying abdominal pressure.  ?                       The patient tolerated the procedure well. The quality  ?                       of the bowel preparation was fair. ?Findings: ?     The perianal  and digital rectal examinations were normal. Pertinent  ?     negatives include normal sphincter tone and no palpable rectal lesions. ?     A 15 mm polyp was found in the sigmoid colon. The polyp was sessile. The  ?     polyp was removed with a hot snare. Resection and retrieval were  ?     complete. To prevent bleeding after the polypectomy, two hemostatic  ?     clips were successfully placed (MR conditional). There was no bleeding  ?     during, or at the end, of the procedure. ?     Multiple small and large-mouthed diverticula were found in the  ?     recto-sigmoid colon, sigmoid colon and descending colon. There was no  ?     evidence of diverticular bleeding. ?     The retroflexed view of the distal rectum and anal verge was normal and  ?     showed no anal or rectal abnormalities. ?Impression:            - Preparation of the colon was fair. ?                       - One 15 mm polyp in the sigmoid colon, removed with a  ?                       hot snare. Resected and retrieved. Clips (MR  ?                       conditional) were placed. ?                       - Severe diverticulosis in the recto-sigmoid colon, in  ?                       the sigmoid colon and in the descending colon. There  ?  was no evidence of diverticular bleeding. ?                       - The distal rectum and anal verge are normal on  ?                       retroflexion view. ?Recommendation:        - Return patient to hospital ward for ongoing care. ?                       - Full liquid diet and mechanical soft diet today. ?                       - Await pathology results. ?Procedure Code(s):     --- Professional --- ?                       [phone], Colonoscopy, flexible; with removal of  ?                       tumor(s), polyp(s), or other lesion(s) by snare  ?                       technique ?Diagnosis Code(s):     --- Professional --- ?                       K63.5, Polyp of colon ?                       R19.5,  Other fecal abnormalities ?                       K57.30, Diverticulosis of large intestine without  ?                       perforation or abscess without bleeding ?CPT copyright 2019 [employer] Medical Association. All rights reserved. ?The codes documented in this report are preliminary and upon coder review may  ?be revised to meet current compliance requirements. ?Dr. Ulyess Mort ?Punam Broussard Raeanne Gathers MD, MD ?11/18/2021 1:56:55 PM ?This report has been signed electronically. ?Number of Addenda: 0 ?Note Initiated On: 11/18/2021 12:43 PM ?Scope Withdrawal Time: 0 hours 14 minutes 55 seconds  ?Total Procedure Duration: 0 hours 34 minutes 8 seconds  ?Estimated Blood Loss:  Estimated blood loss: none. ?     [facility] ?

## 2021-11-18 NOTE — Progress Notes (Signed)
Changed to pediatric scope ?

## 2021-11-18 NOTE — Progress Notes (Signed)
?PROGRESS NOTE ? ? ? ?Alexis Nichols  KLK:[id number] DOB: April 16, 1946 DOA: 11/15/2021 ?PCP: Marguerita Merles, MD  ? ? ?Brief Narrative:  ?Alexis Nichols is a 76 y.o. female with history of CKD, hypertension, chronic GERD presented with nausea, vomiting currently being treated for UTI, CT concerning for possible gastric outlet obstruction.   ? ? ?Assessment and Plan: ?* Nausea & vomiting ?-PPI ?-EGD to r/o gastric outlet obstruction  ? ? ?Palpitation ?- seeing cardiology outpatient ?-replete K ? ?UTI (urinary tract infection) ?-urine cultures with multiple species ? ?HTN (hypertension) ?- aldactone and lopressor.  ? ? ?CKD (chronic kidney disease) ?-resolved ? ? ?Sepsis ([place]) ?-resolved ? ? ?Hypokalemia ?-replete ? ? ? ? ? ? ? ? ? ?DVT prophylaxis: heparin injection 5,000 Units Start: 11/15/21 2315 ? ?  Code Status: Full Code ? ? ?Disposition Plan:  ?Level of care: Med-Surg ?Status is: Inpatient ?Remains inpatient appropriate because: getting EGD/colonoscopy ?  ? ?Consultants:  ?GI ? ? ?Subjective: ?Sitting on bedside commode ? ?Objective: ?Vitals:  ? 11/17/21 1638 11/17/21 2058 11/18/21 0612 11/18/21 0745  ?BP: 113/65 111/66 (!) 112/57 118/65  ?Pulse: 87 82 94 100  ?Resp: '16 18 16 16  '$ ?Temp: 98.5 ?F (36.9 ?C) 99.7 ?F (37.6 ?C) 98.6 ?F (37 ?C) 98.5 ?F (36.9 ?C)  ?TempSrc: Oral   Oral  ?SpO2: 96% 92% 96% 98%  ?Weight:   50.8 kg   ?Height:      ? ? ?Intake/Output Summary (Last 24 hours) at 11/18/2021 1101 ?Last data filed at 11/18/2021 0601 ?Gross per 24 hour  ?Intake 549.48 ml  ?Output --  ?Net 549.48 ml  ? ?Filed Weights  ? 11/16/21 1100 11/18/21 0612  ?Weight: 48.2 kg 50.8 kg  ? ? ?Examination: ? ? ?General: Appearance:    Well developed, well nourished female in no acute distress  ?   ?Lungs:     respirations unlabored  ?Heart:    Tachycardic.  ?  ?MS:   All extremities are intact.  ?  ?Neurologic:   Awake, alert  ?  ? ? ? ?Data Reviewed: I have personally reviewed following labs and imaging studies ? ?CBC: ?Recent Labs  ?Lab  11/15/21 ?1800 11/16/21 ?9794 11/17/21 ?8016  ?WBC 21.1* 17.0* 11.5*  ?HGB 14.8 13.0 12.0  ?HCT 45.6 40.2 37.4  ?MCV 92.3 92.8 93.3  ?PLT 307 270 244  ? ?Basic Metabolic Panel: ?Recent Labs  ?Lab 11/15/21 ?1800 11/16/21 ?0533 11/16/21 ?1954 11/17/21 ?5537  ?NA 142 142 139 142  ?K 3.3* 3.2* 3.2* 3.9  ?CL 94* 100 101 108  ?CO2 33* 31 32 28  ?GLUCOSE 139* 125* 121* 105*  ?BUN 25* '22 15 13  '$ ?CREATININE 1.14* 0.98 0.84 0.83  ?CALCIUM 10.1 8.9 8.5* 8.7*  ? ?GFR: ?Estimated Creatinine Clearance: 44.9 mL/min (by C-G formula based on SCr of 0.83 mg/dL). ?Liver Function Tests: ?Recent Labs  ?Lab 11/15/21 ?1800 11/16/21 ?0533  ?AST 29 23  ?ALT 14 13  ?ALKPHOS 109 91  ?BILITOT 1.0 0.8  ?PROT 6.9 6.0*  ?ALBUMIN 3.9 3.4*  ? ?Recent Labs  ?Lab 11/15/21 ?1800  ?LIPASE 41  ? ?No results for input(s): AMMONIA in the last 168 hours. ?Coagulation Profile: ?No results for input(s): INR, PROTIME in the last 168 hours. ?Cardiac Enzymes: ?No results for input(s): CKTOTAL, CKMB, CKMBINDEX, TROPONINI in the last 168 hours. ?BNP (last 3 results) ?No results for input(s): PROBNP in the last 8760 hours. ?HbA1C: ?No results for input(s): HGBA1C in the last 72 hours. ?  CBG: ?No results for input(s): GLUCAP in the last 168 hours. ?Lipid Profile: ?No results for input(s): CHOL, HDL, LDLCALC, TRIG, CHOLHDL, LDLDIRECT in the last 72 hours. ?Thyroid Function Tests: ?No results for input(s): TSH, T4TOTAL, FREET4, T3FREE, THYROIDAB in the last 72 hours. ?Anemia Panel: ?No results for input(s): VITAMINB12, FOLATE, FERRITIN, TIBC, IRON, RETICCTPCT in the last 72 hours. ?Sepsis Labs: ?Recent Labs  ?Lab 11/15/21 ?2157 11/15/21 ?2336 11/16/21 ?3785 11/17/21 ?8850  ?PROCALCITON <0.10  --  <0.10 <0.10  ?LATICACIDVEN 2.3* 1.8  --   --   ? ? ?Recent Results (from the past 240 hour(s))  ?Urine Culture     Status: Abnormal  ? Collection Time: 11/15/21  8:32 PM  ? Specimen: Urine, Clean Catch  ?Result Value Ref Range Status  ? Specimen Description   Final  ?  URINE,  CLEAN CATCH ?Performed at [facility], [address], [place], [place] [address] ?  ? Special Requests   Final  ?  NONE ?Performed at [facility], [address] [place], [place] [address] ?  ? Culture MULTIPLE SPECIES PRESENT, SUGGEST RECOLLECTION (A)  Final  ? Report Status 11/17/2021 FINAL  Final  ?Culture, blood (single)     Status: None (Preliminary result)  ? Collection Time: 11/15/21  9:57 PM  ? Specimen: BLOOD  ?Result Value Ref Range Status  ? Specimen Description BLOOD RIGHT FOREARM  Final  ? Special Requests   Final  ?  BOTTLES DRAWN AEROBIC AND ANAEROBIC Blood Culture results may not be optimal due to an inadequate volume of blood received in culture bottles  ? Culture   Final  ?  NO GROWTH 3 DAYS ?Performed at [facility] [address] [place], [place] [address] ?  ? Report Status PENDING  Incomplete  ?  ? ? ? ? ? ?Radiology Studies: ?No results found. ? ? ? ? ? ?Scheduled Meds: ? feeding supplement  237 mL Oral Q2000  ? heparin  5,000 Units Subcutaneous Q8H  ? metoprolol tartrate  25 mg Oral Daily  ? oxybutynin  5 mg Oral TID  ? pantoprazole  40 mg Oral Daily  ? sodium chloride flush  3 mL Intravenous Q12H  ? spironolactone  50 mg Oral Daily  ? ?Continuous Infusions: ? cefTRIAXone (ROCEPHIN)  IV Stopped (11/17/21 2144)  ? ? ? LOS: 1 day  ? ? ?Time spent: 45 minutes spent on chart review, discussion with nursing staff, consultants, updating family and interview/physical exam; more than 50% of that time was spent in counseling and/or coordination of care. ? ? ? ?Geradine Girt, DO ?[facility] ?Available via Epic secure chat 7am-7pm ?After these hours, please refer to coverage provider listed on [email or web address] ?11/18/2021, 11:01 AM   ?

## 2021-11-18 NOTE — Op Note (Signed)
[facility] ?Gastroenterology ?Patient Name: Alexis Nichols ?Procedure Date: 11/18/2021 12:44 PM ?MRN: [id number] ?Account #: [id number] ?Date of Birth: 10/28/1945 ?Admit Type: Outpatient ?Age: 76 ?Room: [facility] ENDO ROOM 2 ?Gender: Female ?Note Status: Finalized ?Instrument Name: Upper-Endoscope [id number] ?Procedure:             Upper GI endoscopy ?Indications:           Epigastric abdominal pain, Esophageal dysphagia,  ?                       Nausea with vomiting ?Providers:             Lin Landsman MD, MD ?Referring MD:          Forest Gleason Md, MD (Referring MD) ?Medicines:             General Anesthesia ?Complications:         No immediate complications. Estimated blood loss:  ?                       Minimal. ?Procedure:             Pre-Anesthesia Assessment: ?                       - Prior to the procedure, a History and Physical was  ?                       performed, and patient medications and allergies were  ?                       reviewed. The patient is competent. The risks and  ?                       benefits of the procedure and the sedation options and  ?                       risks were discussed with the patient. All questions  ?                       were answered and informed consent was obtained.  ?                       Patient identification and proposed procedure were  ?                       verified by the physician, the nurse, the  ?                       anesthesiologist, the anesthetist and the technician  ?                       in the pre-procedure area in the procedure room in the  ?                       endoscopy suite. Mental Status Examination: alert and  ?                       oriented. Airway Examination: normal oropharyngeal  ?  airway and neck mobility. Respiratory Examination:  ?                       clear to auscultation. CV Examination: normal.  ?                       Prophylactic Antibiotics: The patient does not require  ?                        prophylactic antibiotics. Prior Anticoagulants: The  ?                       patient has taken no previous anticoagulant or  ?                       antiplatelet agents. ASA Grade Assessment: III - A  ?                       patient with severe systemic disease. After reviewing  ?                       the risks and benefits, the patient was deemed in  ?                       satisfactory condition to undergo the procedure. The  ?                       anesthesia plan was to use general anesthesia.  ?                       Immediately prior to administration of medications,  ?                       the patient was re-assessed for adequacy to receive  ?                       sedatives. The heart rate, respiratory rate, oxygen  ?                       saturations, blood pressure, adequacy of pulmonary  ?                       ventilation, and response to care were monitored  ?                       throughout the procedure. The physical status of the  ?                       patient was re-assessed after the procedure. ?                       After obtaining informed consent, the endoscope was  ?                       passed under direct vision. Throughout the procedure,  ?                       the patient's blood pressure, pulse, and oxygen  ?  saturations were monitored continuously. The Endoscope  ?                       was introduced through the mouth, and advanced to the  ?                       second part of duodenum. The upper GI endoscopy was  ?                       accomplished without difficulty. The patient tolerated  ?                       the procedure well. ?Findings: ?     LA Grade D (one or more mucosal breaks involving at least 75% of  ?     esophageal circumference) esophagitis with no bleeding was found 23 to  ?     35 cm from the incisors. Cells for cytology were obtained by brushing.  ?     Estimated blood loss: none. ?     One non-bleeding superficial gastric  ulcer with a clean ulcer base  ?     (Forrest Class III) was found in the gastric antrum. The lesion was 20  ?     mm in largest dimension. ?     A benign-appearing, intrinsic severe stenosis was found at the pylorus.  ?     This was non-traversed. A TTS dilator was passed through the scope.  ?     Dilation with an 02-22-09 mm pyloric balloon dilator was performed to  ?     74m. The dilation site was examined following endoscope reinsertion and  ?     showed mild mucosal disruption and moderate improvement in luminal  ?     narrowing. Estimated blood loss was minimal. ?     A few small sessile polyps with no bleeding and no stigmata of recent  ?     bleeding were found in the gastric body. Biopsies were taken with a cold  ?     forceps for histology. ?     Retained fluid was found in the gastric fundus and in the gastric body.  ?     Suctioned ?     The cardia and gastric fundus were normal on retroflexion. ?Impression:            - LA Grade D reflux and candidiasis esophagitis with  ?                       no bleeding. Cells for cytology obtained. ?                       - Non-bleeding gastric ulcer with a clean ulcer base  ?                       (Forrest Class III). ?                       - Gastric stenosis was found at the pylorus. Dilated. ?                       - A few gastric polyps. Biopsied. ?                       -  Retained gastric fluid. ?Recommendation:        - Use Prilosec (omeprazole) 40 mg PO BID indefinitely. ?                       - Full liquid diet and mechanical soft diet long term. ?                       - Repeat upper endoscopy in 2 months to check healing. ?                       - Return to my office in 1 week. ?                       - Await pathology results. ?Procedure Code(s):     --- Professional --- ?                       [id number], Esophagogastroduodenoscopy, flexible,  ?                       transoral; with dilation of gastric/duodenal  ?                       stricture(s) (eg,  balloon, bougie) ?                       43239, 59, Esophagogastroduodenoscopy, flexible,  ?                       transoral; with biopsy, single or multiple ?Diagnosis Code(s):     --- Professional --- ?                       K21.00, Gastro-esophageal reflux disease with  ?                       esophagitis, without bleeding ?                       B37.81, Candidal esophagitis ?                       K25.9, Gastric ulcer, unspecified as acute or chronic,  ?                       without hemorrhage or perforation ?                       K31.1, Adult hypertrophic pyloric stenosis ?                       K31.7, Polyp of stomach and duodenum ?                       R10.13, Epigastric pain ?                       R11.2, Nausea with vomiting, unspecified ?                       R13.14, Dysphagia, pharyngoesophageal phase ?CPT copyright 2019 [employer]. All rights reserved. ?The codes documented in this report are preliminary and upon  coder review may  ?be revised to meet current compliance requirements. ?Dr. Ulyess Mort ?Quinnie Barcelo Raeanne Gathers MD, MD ?11/18/2021 1:17:40 PM ?This report has been signed electronically. ?Number of Addenda: 0 ?Note Initiated On: 11/18/2021 12:44 PM ?Estimated Blood Loss:  Estimated blood loss was minimal. ?     [facility] ?

## 2021-11-18 NOTE — Hospital Course (Addendum)
Alexis Nichols is a 76 y.o. female with history of CKD, hypertension, chronic GERD presented with nausea, vomiting currently being treated for UTI, CT concerning for possible gastric outlet obstruction.  S/p EGD/colonoscopy. ?

## 2021-11-18 NOTE — Transfer of Care (Signed)
Immediate Anesthesia Transfer of Care Note ? ?Patient: QUIERA DIFFEE ? ?Procedure(s) Performed: ESOPHAGOGASTRODUODENOSCOPY (EGD) WITH PROPOFOL ?COLONOSCOPY WITH PROPOFOL ? ?Patient Location: PACU and Endoscopy Unit ? ?Anesthesia Type:General ? ?Level of Consciousness: awake, drowsy and patient cooperative ? ?Airway & Oxygen Therapy: Patient Spontanous Breathing ? ?Post-op Assessment: Report given to RN and Post -op Vital signs reviewed and stable ? ?Post vital signs: Reviewed and stable ? ?Last Vitals:  ?Vitals Value Taken Time  ?BP 122/54 11/18/21 1402  ?Temp    ?Pulse 111 11/18/21 1401  ?Resp 18 11/18/21 1403  ?SpO2 96 % 11/18/21 1401  ?Vitals shown include unvalidated device data. ? ?Last Pain:  ?Vitals:  ? 11/18/21 1400  ?TempSrc: (P) Temporal  ?PainSc:   ?   ? ?  ? ?Complications: No notable events documented. ?

## 2021-11-18 NOTE — Plan of Care (Signed)

## 2021-11-19 DIAGNOSIS — N189 Chronic kidney disease, unspecified: Secondary | ICD-10-CM | POA: Diagnosis not present

## 2021-11-19 DIAGNOSIS — K2210 Ulcer of esophagus without bleeding: Secondary | ICD-10-CM

## 2021-11-19 DIAGNOSIS — R112 Nausea with vomiting, unspecified: Secondary | ICD-10-CM | POA: Diagnosis not present

## 2021-11-19 DIAGNOSIS — E876 Hypokalemia: Secondary | ICD-10-CM | POA: Diagnosis not present

## 2021-11-19 LAB — MAGNESIUM: Magnesium: 2 mg/dL (ref 1.7–2.4)

## 2021-11-19 LAB — BASIC METABOLIC PANEL
Anion gap: 6 (ref 5–15)
BUN: 7 mg/dL — ABNORMAL LOW (ref 8–23)
CO2: 29 mmol/L (ref 22–32)
Calcium: 9.1 mg/dL (ref 8.9–10.3)
Chloride: 105 mmol/L (ref 98–111)
Creatinine, Ser: 0.81 mg/dL (ref 0.44–1.00)
GFR, Estimated: >60 mL/min (ref >60)
Glucose, Bld: 98 mg/dL (ref 70–99)
Potassium: 3.7 mmol/L (ref 3.5–5.1)
Sodium: 140 mmol/L (ref 135–145)

## 2021-11-19 LAB — CBC
HCT: 39.8 % (ref 36.0–46.0)
Hemoglobin: 12.7 g/dL (ref 12.0–15.0)
MCH: 29.6 pg (ref 26.0–34.0)
MCHC: 31.9 g/dL (ref 30.0–36.0)
MCV: 92.8 fL (ref 80.0–100.0)
Platelets: 285 10*3/uL (ref 150–400)
RBC: 4.29 MIL/uL (ref 3.87–5.11)
RDW: 13.6 % (ref 11.5–15.5)
WBC: 8.4 10*3/uL (ref 4.0–10.5)
nRBC: 0 % (ref 0.0–0.2)

## 2021-11-19 MED ORDER — DOCUSATE SODIUM 100 MG PO CAPS: 100.0000 mg | ORAL_CAPSULE | Freq: Two times a day (BID) | ORAL | 0 refills | Status: DC

## 2021-11-19 MED ORDER — OXYBUTYNIN CHLORIDE 5 MG PO TABS: 5.0000 mg | ORAL_TABLET | Freq: Three times a day (TID) | ORAL | 0 refills | Status: DC

## 2021-11-19 MED ORDER — PHENAZOPYRIDINE HCL 100 MG PO TABS
100.0000 mg | ORAL_TABLET | Freq: Once | ORAL | Status: AC
  Administered 2021-11-19: 100 mg via ORAL
  Filled 2021-11-19: qty 1

## 2021-11-19 NOTE — Progress Notes (Signed)
Attempted to call patient's daughter to arrange pick up. No answer. Message left.  ?

## 2021-11-19 NOTE — Progress Notes (Signed)
Patient verbalized understanding of all discharge instructions, including follow up with both GI and PCP. Patient's daughter Eustaquio Maize to provide transportation home. Patient off unit via w/c in stable condition.  ?

## 2021-11-19 NOTE — Discharge Summary (Signed)
?Physician Discharge Summary ?  ?Patient: Alexis Nichols MRN: [id number] DOB: [birth year]  ?Admit date:     11/15/2021  ?Discharge date: 11/19/21  ?Discharge Physician: Geradine Girt  ? ?PCP: Marguerita Merles, MD  ? ?Recommendations at discharge:  ? ? Soft diet ?Repeat upper endoscopy in 2 months to check healing. ?GI follow up 1 week ?Pathology pending ?PPI BID ? ? ?Discharge Diagnoses: ?Principal Problem: ?  Nausea & vomiting ?Active Problems: ?  UTI (urinary tract infection) ?  Palpitation ?  HTN (hypertension) ?  CKD (chronic kidney disease) ?  Sepsis ([place]) ?  Hypokalemia ?  Acquired pyloric stricture ?  Partial gastric outlet obstruction ?  Gastric ulcer without hemorrhage or perforation ?  Erosive esophagitis ?  Polyp of sigmoid colon ? ? ? ?Hospital Course: ?Alexis Nichols is a 76 y.o. female with history of CKD, hypertension, chronic GERD presented with nausea, vomiting currently being treated for UTI, CT concerning for possible gastric outlet obstruction.  S/p EGD/colonoscopy. ? ?Assessment and Plan: ?* Nausea & vomiting ?-PPI BID ?-EGD: gastric stenosis: will need soft diet and then repeat EGD in 2 months  ?Colonoscopy: One 15 mm polyp in the sigmoid colon, removed with a  ?                       hot snare. Resected and retrieved. Clips (MR  ?                       conditional) were placed. ?                       - Severe diverticulosis in the recto-sigmoid colon, in  ?                       the sigmoid colon  ? ? ?Palpitation ?- seeing cardiology outpatient ?-replete K ? ?UTI (urinary tract infection) ?-urine cultures with multiple species ?-resume home treatment ? ?HTN (hypertension) ?- aldactone and lopressor.  ? ? ?CKD (chronic kidney disease) ?-resolved ? ? ?Sepsis ([place]) ?-resolved ? ? ?Hypokalemia ?-replete ? ? ? ? ?  ?Consultants: GI ?Disposition: Home ?Diet recommendation:  ?Soft ? ?DISCHARGE MEDICATION: ?Allergies as of 11/19/2021   ?No Known Allergies ?  ? ?  ?Medication List  ?  ? ?TAKE these  medications   ? ?acetaminophen 500 MG tablet ?Commonly known as: TYLENOL ?Take 1 tablet (500 mg total) by mouth every 12 (twelve) hours. ?  ?ciprofloxacin 500 MG tablet ?Commonly known as: CIPRO ?Take 500 mg by mouth 2 (two) times daily. ?  ?docusate sodium 100 MG capsule ?Commonly known as: COLACE ?Take 1 capsule (100 mg total) by mouth 2 (two) times daily. ?  ?feeding supplement Liqd ?Take 237 mLs by mouth daily at 8 pm. ?  ?metoprolol tartrate 25 MG tablet ?Commonly known as: LOPRESSOR ?Take 25 mg by mouth daily. ?  ?omeprazole 40 MG capsule ?Commonly known as: PRILOSEC ?Take 40 mg by mouth daily. ?  ?oxybutynin 5 MG tablet ?Commonly known as: DITROPAN ?Take 1 tablet (5 mg total) by mouth 3 (three) times daily. ?  ?PRESERVISION AREDS PO ?Take by mouth. ?  ?spironolactone 50 MG tablet ?Commonly known as: ALDACTONE ?Take 50 mg by mouth daily. ?  ?triamcinolone cream 0.1 % ?Commonly known as: KENALOG ?Apply to eczema skin qd-bid up to 5 days a week prn,Avoid applying to face,neck, groin, and axilla.  Use as directed. Long-term use can cause thinning of the skin. ?  ? ?  ? ? Follow-up Information   ? ? Lin Landsman, MD. Call in 1 week(s).   ?Specialty: Gastroenterology ?Contact information: ?[place] [address] ?[phone] ? ? ?  ?  ? ? Marguerita Merles, MD. Call in 2 week(s).   ?Specialty: Family Medicine ?Contact information: ?[address] ?[place] [address] ?[phone] ? ? ?  ?  ? ?  ?  ? ?  ? ?Discharge Exam: ?Filed Weights  ? 11/16/21 1100 11/18/21 0612  ?Weight: 48.2 kg 50.8 kg  ? ? ? ?Condition at discharge: good ? ?The results of significant diagnostics from this hospitalization (including imaging, microbiology, ancillary and laboratory) are listed below for reference.  ? ?Imaging Studies: ?CT ABDOMEN PELVIS W CONTRAST ? ?Result Date: 11/15/2021 ?CLINICAL DATA:  Nausea and vomiting since yesterday EXAM: CT ABDOMEN AND PELVIS WITH CONTRAST TECHNIQUE: Multidetector CT  imaging of the abdomen and pelvis was performed using the standard protocol following bolus administration of intravenous contrast. RADIATION DOSE REDUCTION: This exam was performed according to the departmental dose-optimization program which includes automated exposure control, adjustment of the mA and/or kV according to patient size and/or use of iterative reconstruction technique. CONTRAST:  97m OMNIPAQUE IOHEXOL 300 MG/ML  SOLN COMPARISON:  02/06/2020 FINDINGS: Lower chest: No acute pleural or parenchymal lung disease. Hepatobiliary: No focal liver abnormality is seen. No gallstones, gallbladder wall thickening, or biliary dilatation. Pancreas: Unremarkable. No pancreatic ductal dilatation or surrounding inflammatory changes. Spleen: Normal in size without focal abnormality. Adrenals/Urinary Tract: Bilateral renal cortical thinning. Otherwise the kidneys enhance normally and symmetrically. No urinary tract calculi or obstructive uropathy. The adrenals and bladder are unremarkable. Stomach/Bowel: No bowel obstruction or ileus. Normal appendix right lower quadrant. Scattered diverticulosis within the descending and sigmoid colon without evidence of acute diverticulitis. There is prominent gastric distension, with chronic circumferential wall thickening in the region of the gastric pylorus. If not previously evaluated, endoscopy may be useful. Vascular/Lymphatic: Aortic atherosclerosis. 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 No pathologic adenopathy within the abdomen or pelvis. Reproductive: Uterus and bilateral adnexa are unremarkable. Other: No free fluid or free intraperitoneal gas. No abdominal wall hernia. Musculoskeletal: No acute or destructive bony lesions. Reconstructed images demonstrate chronic anterior wedging of the lower thoracic spine with prominent Schmorl's nodes from T8  through T10. IMPRESSION: 1. Prominent gastric distension, with chronic circumferential wall thickening in the region of the pylorus. This persists on delayed imaging, and endoscopy may be useful to evaluate for underlying scarring or mass lesion resulting in gastric outlet obstruction. 2. Distal colonic diverticulosis without diverticulitis. 3. 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 Please correlate with any symptoms of left lower extremity claudication. 4.  Aortic Atherosclerosis (ICD10-I70.0). Electronically Signed   By: MRanda NgoM.D.   On: 11/15/2021 21:58  ? ?DG Chest Portable 1 View ? ?Result Date: 11/15/2021 ?CLINICAL DATA:  Sepsis, vomiting, diarrhea EXAM: PORTABLE CHEST 1 VIEW COMPARISON:  12/23/2011 FINDINGS: Single frontal view of the chest demonstrates an unremarkable cardiac silhouette. No acute airspace disease, effusion, or pneumothorax. Severe degenerative changes of the bilateral shoulders, left greater than right, stable. No acute fracture. IMPRESSION: 1. No acute intrathoracic process. Electronically Signed   By: MLegrand Como  Owens Shark M.D.   On: 11/15/2021 22:45   ? ?Microbiology: ?Results for orders placed or performed during the hospital encounter of 11/15/21  ?Urine Culture     Status: Abnormal  ? Collection Time: 11/15/21  8:32 PM  ? Specimen: Urine, Clean Catch  ?Result Value Ref Range Status  ? Specimen Description   Final  ?  URINE, CLEAN CATCH ?Performed at [facility], [address], [place], [place] [address] ?  ? Special Requests   Final  ?  NONE ?Performed at [facility], [address], [place], [place] [address] ?  ? Culture MULTIPLE SPECIES PRESENT, SUGGEST RECOLLECTION (A)  Final  ? Report Status 11/17/2021 FINAL  Final  ?Culture, blood (single)     Status: None (Preliminary result)  ? Collection Time: 11/15/21  9:57 PM  ? Specimen: BLOOD   ?Result Value Ref Range Status  ? Specimen Description BLOOD RIGHT FOREARM  Final  ? Special Requests   Final  ?  BOTTLES DRAWN AEROBIC AND ANAEROBIC Blood Culture results may not be optimal due to an inadequate volum

## 2021-11-21 ENCOUNTER — Telehealth: Payer: Self-pay

## 2021-11-21 ENCOUNTER — Encounter: Payer: Self-pay | Admitting: Gastroenterology

## 2021-11-21 NOTE — Telephone Encounter (Signed)
Made appointment 12/19/2021 ?

## 2021-11-21 NOTE — Telephone Encounter (Signed)
-----   Message from Lin Landsman, MD sent at 11/18/2021  2:50 PM EDT ----- ?Regarding: Hospital follow-up ?Caryl Pina ? ?Please schedule this patient for follow-up in 4 weeks, okay to overbook ?Dx: Gastric outlet obstruction, pyloric stricture, esophagitis ? ?Thanks ?RV ? ?

## 2021-11-21 NOTE — Anesthesia Postprocedure Evaluation (Signed)
Anesthesia Post Note ? ?Patient: Alexis Nichols ? ?Procedure(s) Performed: ESOPHAGOGASTRODUODENOSCOPY (EGD) WITH PROPOFOL ?COLONOSCOPY WITH PROPOFOL ? ?Patient location during evaluation: Endoscopy ?Anesthesia Type: General ?Level of consciousness: awake and alert ?Pain management: pain level controlled ?Vital Signs Assessment: post-procedure vital signs reviewed and stable ?Respiratory status: spontaneous breathing, nonlabored ventilation, respiratory function stable and patient connected to nasal cannula oxygen ?Cardiovascular status: blood pressure returned to baseline and stable ?Postop Assessment: no apparent nausea or vomiting ?Anesthetic complications: no ? ? ?No notable events documented. ? ? ?Last Vitals:  ?Vitals:  ? 11/19/21 0502 11/19/21 0825  ?BP: 96/69 (!) 127/54  ?Pulse: (!) 52 69  ?Resp: 18 16  ?Temp: 36.7 ?C 36.8 ?C  ?SpO2: 98% 99%  ?  ?Last Pain:  ?Vitals:  ? 11/19/21 0825  ?TempSrc:   ?PainSc: 0-No pain  ? ? ?  ?  ?  ?  ?  ?  ? ?[name] ? ? ? ? ?

## 2021-11-22 ENCOUNTER — Encounter: Payer: Self-pay | Admitting: Dermatology

## 2021-11-22 LAB — CULTURE, BLOOD (SINGLE): Culture: NO GROWTH

## 2021-11-22 LAB — SURGICAL PATHOLOGY

## 2021-11-23 ENCOUNTER — Telehealth: Payer: Self-pay

## 2021-11-23 ENCOUNTER — Other Ambulatory Visit: Payer: Self-pay

## 2021-11-23 DIAGNOSIS — K635 Polyp of colon: Secondary | ICD-10-CM

## 2021-11-23 DIAGNOSIS — K259 Gastric ulcer, unspecified as acute or chronic, without hemorrhage or perforation: Secondary | ICD-10-CM

## 2021-11-23 NOTE — Telephone Encounter (Signed)
Patient verbalized understanding of results and schedule the procedure for 01/23/2022 went over instructions and mailed them  ?

## 2021-11-23 NOTE — Telephone Encounter (Signed)
-----   Message from Lin Landsman, MD sent at 11/22/2021 10:39 PM EDT ----- ?Based on the pathology results from colon polyp, she will need a sigmoidoscopy in 2 months, please schedule along with EGD ?Flex sig for sigmoid colon high risk  adenoma ?EGD for gastric ulcer ? ?Thanks ?RV ? ?

## 2021-12-19 ENCOUNTER — Ambulatory Visit (INDEPENDENT_AMBULATORY_CARE_PROVIDER_SITE_OTHER): Payer: Medicare HMO | Admitting: Gastroenterology

## 2021-12-19 ENCOUNTER — Encounter: Payer: Self-pay | Admitting: Gastroenterology

## 2021-12-19 ENCOUNTER — Other Ambulatory Visit: Payer: Self-pay

## 2021-12-19 VITALS — BP 136/60 | HR 65 | Temp 98.7°F | Ht 62.0 in | Wt 101.5 lb

## 2021-12-19 DIAGNOSIS — D126 Benign neoplasm of colon, unspecified: Secondary | ICD-10-CM | POA: Diagnosis not present

## 2021-12-19 DIAGNOSIS — K2210 Ulcer of esophagus without bleeding: Secondary | ICD-10-CM

## 2021-12-19 DIAGNOSIS — K311 Adult hypertrophic pyloric stenosis: Secondary | ICD-10-CM | POA: Diagnosis not present

## 2021-12-19 NOTE — Progress Notes (Signed)
[name], MD [address]  [place]  [place], [place] [address]  Main: [phone]  Fax: [phone]    Gastroenterology Consultation  Referring Provider:     Marguerita Merles, MD Primary Care Physician:  Marguerita Merles, MD Primary Gastroenterologist:  Dr. [name] Reason for Consultation: Erosive esophagitis, gastric outlet obstruction        HPI:   Alexis Nichols is a 76 y.o. female referred by Dr. Lennox Grumbles, Connye Burkitt, MD  for consultation & management of recent hospitalization for nausea, vomiting and dysphagia.  Patient was originally seen at [facility] on 11/17/2021.  She underwent upper endoscopy which revealed severe erosive esophagitis, Candida esophagitis as well as pyloric stenosis which was dilated to 10 mm.  She also had gastric ulcer, clean-based.  Patient is currently taking omeprazole 40 mg once a day.  Patient denies any difficulty swallowing.  Her weight has been stable.  She does not have any concerns today  NSAIDs: None  Antiplts/Anticoagulants/Anti thrombotics: None  GI Procedures:  EGD and colonoscopy 11/18/2021 - LA Grade D reflux and candidiasis esophagitis with no bleeding. Cells for cytology obtained. - Non-bleeding gastric ulcer with a clean ulcer base (Forrest Class III). - Gastric stenosis was found at the pylorus. Dilated to 10 mm. - A few gastric polyps. Biopsied. - Retained gastric fluid.  - Preparation of the colon was fair. - One 15 mm polyp in the sigmoid colon, removed with a hot snare. Resected and retrieved. Clips (MR conditional) were placed. - Severe diverticulosis in the recto-sigmoid colon, in the sigmoid colon and in the descending colon. There was no evidence of diverticular bleeding. - The distal rectum and anal verge are normal on retroflexion view.  DIAGNOSIS:  A. STOMACH, RANDOM; COLD BIOPSY:  - GASTRIC ANTRAL AND OXYNTIC MUCOSA WITH MILD CHRONIC INACTIVE GASTRITIS  AND REACTIVE FOVEOLAR HYPERPLASIA.  - NEGATIVE FOR H.  PYLORI, DYSPLASIA, AND MALIGNANCY.   B. GASTRIC POLYP; COLD BIOPSY:  - FUNDIC GLAND POLYP.  - NEGATIVE FOR H. PYLORI, DYSPLASIA, AND MALIGNANCY.   C. 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  - NO EVIDENCE OF MALIGNANCY.   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([place])    CKD (chronic kidney disease)    Fracture of tibial shaft, left, closed 07/14/2019   GERD (gastroesophageal reflux disease)    HTN (hypertension)    Osteoporosis    Vitamin D insufficiency 07/14/2019    Past Surgical History:  Procedure Laterality Date   BRAIN TUMOR EXCISION  11/2018   excision of acoustic neuroma    COLONOSCOPY WITH PROPOFOL N/A 11/18/2021   Procedure: COLONOSCOPY WITH PROPOFOL;  Surgeon: Lin Landsman, MD;  Location: [facility];  Service: Gastroenterology;  Laterality: N/A;   ESOPHAGOGASTRODUODENOSCOPY (EGD) WITH PROPOFOL N/A 11/18/2021   Procedure: ESOPHAGOGASTRODUODENOSCOPY (EGD) WITH PROPOFOL;  Surgeon: Lin Landsman, MD;  Location: [facility] ENDOSCOPY;  Service: Gastroenterology;  Laterality: N/A;   EXTERNAL FIXATION LEG Left 07/12/2019   Procedure: EXTERNAL FIXATION LEG;  Surgeon: Marchia Bond, MD;  Location: [place];  Service: Orthopedics;  Laterality: Left;   EXTERNAL FIXATION REMOVAL Left 07/15/2019   Procedure: Removal External Fixation Leg;  Surgeon: Altamese Menahga, MD;  Location: [place];  Service: Orthopedics;  Laterality: Left;   ORIF TIBIA FRACTURE Left 07/15/2019   Procedure: OPEN REDUCTION INTERNAL FIXATION (ORIF) TIBIA FRACTURE;  Surgeon: Altamese , MD;  Location: [place];  Service:  Orthopedics;  Laterality: Left;   ORIF TIBIA PLATEAU Left 07/15/2019   Procedure: OPEN REDUCTION INTERNAL FIXATION (ORIF) TIBIAL PLATEAU;  Surgeon: Altamese [place], MD;  Location: [place];  Service: Orthopedics;  Laterality: Left;   Current Outpatient Medications:    acetaminophen (TYLENOL) 500 MG tablet, Take 1  tablet (500 mg total) by mouth every 12 (twelve) hours., Disp: 30 tablet, Rfl: 0   cetirizine (ZYRTEC) 10 MG tablet, Take 1 tablet by mouth daily., Disp: , Rfl:    ciprofloxacin (CIPRO) 500 MG tablet, Take 500 mg by mouth 2 (two) times daily., Disp: , Rfl:    docusate sodium (COLACE) 100 MG capsule, Take 1 capsule (100 mg total) by mouth 2 (two) times daily., Disp: 20 capsule, Rfl: 0   feeding supplement, ENSURE ENLIVE, (ENSURE ENLIVE) LIQD, Take 237 mLs by mouth daily at 8 pm., Disp: 237 mL, Rfl: 12   magnesium oxide (MAG-OX) 400 MG tablet, Take 1 tablet by mouth 2 (two) times daily., Disp: , Rfl:    metoprolol tartrate (LOPRESSOR) 25 MG tablet, Take 25 mg by mouth daily. , Disp: , Rfl:    Multiple Vitamins-Minerals (PRESERVISION AREDS PO), Take by mouth., Disp: , Rfl:    omeprazole (PRILOSEC) 40 MG capsule, Take 40 mg by mouth daily., Disp: , Rfl:    oxybutynin (DITROPAN) 5 MG tablet, Take 1 tablet by mouth 3 (three) times daily., Disp: , Rfl:    spironolactone (ALDACTONE) 50 MG tablet, Take 50 mg by mouth daily. , Disp: , Rfl:    triamcinolone cream (KENALOG) 0.5 %, Apply topically., Disp: , Rfl:   No family history on file.   Social History   Tobacco Use   Smoking status: Never   Smokeless tobacco: Never  Substance Use Topics   Alcohol use: Never    Allergies as of 12/19/2021 - Review Complete 12/19/2021  Allergen Reaction Noted   Beta adrenergic blockers Other (See Comments) 04/20/2015    Review of Systems:    All systems reviewed and negative except where noted in HPI.   Physical Exam:  BP 136/60 (BP Location: Left Arm, Patient Position: Sitting, Cuff Size: Normal)   Pulse 65   Temp 98.7 F (37.1 C) (Oral)   Ht '5\' 2"'$  (1.575 m)   Wt 101 lb 8 oz (46 kg)   BMI 18.56 kg/m  No LMP recorded. Patient is postmenopausal.  General:   Alert, thin built, moderately nourished, pleasant and cooperative in NAD Head:  Normocephalic and atraumatic. Eyes:  Sclera clear, no icterus.    Conjunctiva pink. Ears:  Normal auditory acuity. Nose:  No deformity, discharge, or lesions. Mouth:  No deformity or lesions,oropharynx pink & moist. Neck:  Supple; no masses or thyromegaly. Lungs:  Respirations even and unlabored.  Clear throughout to auscultation.   No wheezes, crackles, or rhonchi. No acute distress. Heart:  Regular rate and rhythm; no murmurs, clicks, rubs, or gallops. Abdomen:  Normal bowel sounds. Soft, non-tender and non-distended without masses, hepatosplenomegaly or hernias noted.  No guarding or rebound tenderness.   Rectal: Not performed Msk:  Symmetrical without gross deformities. Good, equal movement & strength bilaterally. Pulses:  Normal pulses noted. Extremities:  No clubbing or edema.  No cyanosis. Neurologic:  Alert and oriented x3;  grossly normal neurologically. Skin:  Intact without significant lesions or rashes. No jaundice. Psych:  Alert and cooperative. Normal mood and affect.  Imaging Studies: Reviewed  Assessment and Plan:   YARNELL KOZLOSKI is a 76 y.o. pleasant Caucasian female with  history of osteoporosis is seen in consultation for dysphagia, gastric outlet obstruction.   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

## 2021-12-26 ENCOUNTER — Ambulatory Visit (INDEPENDENT_AMBULATORY_CARE_PROVIDER_SITE_OTHER): Payer: Medicare HMO | Admitting: Podiatry

## 2021-12-26 ENCOUNTER — Encounter: Payer: Self-pay | Admitting: Podiatry

## 2021-12-26 DIAGNOSIS — M79675 Pain in left toe(s): Secondary | ICD-10-CM

## 2021-12-26 DIAGNOSIS — M79674 Pain in right toe(s): Secondary | ICD-10-CM

## 2021-12-26 DIAGNOSIS — B351 Tinea unguium: Secondary | ICD-10-CM

## 2021-12-30 ENCOUNTER — Encounter: Payer: Self-pay | Admitting: Podiatry

## 2021-12-30 NOTE — Progress Notes (Signed)
  Subjective:  Patient ID: Alexis Nichols, female    DOB: 1946-06-29,  MRN: [id number]  Chief Complaint  Patient presents with   Nail Problem    NP   Thick painful toenails    76 y.o. female presents with the above complaint. History confirmed with patient.  Her nails are thickened and elongated and she has difficulty cutting them, she has macular degeneration and cannot see her toenails  Objective:  Physical Exam: warm, good capillary refill, no trophic changes or ulcerative lesions, normal DP and PT pulses, and normal sensory exam. Left Foot: dystrophic yellowed discolored nail plates with subungual debris Right Foot: dystrophic yellowed discolored nail plates with subungual debris  Assessment:   1. Pain due to onychomycosis of toenails of both feet      Plan:  Patient was evaluated and treated and all questions answered.  Discussed the etiology and treatment options for the condition in detail with the patient. Educated patient on the topical and oral treatment options for mycotic nails. Recommended debridement of the nails today. Sharp and mechanical debridement performed of all painful and mycotic nails today. Nails debrided in length and thickness using a nail nipper to level of comfort. Discussed treatment options including appropriate shoe gear. Follow up as needed for painful nails.    Return in about 10 weeks (around 03/06/2022) for thickened painful nails.

## 2022-01-23 ENCOUNTER — Ambulatory Visit
Admission: RE | Admit: 2022-01-23 | Discharge: 2022-01-23 | Disposition: A | Discharge status: Home / Self Care | Payer: Medicare HMO | Attending: Gastroenterology | Admitting: Gastroenterology

## 2022-01-23 ENCOUNTER — Encounter
Admission: RE | Disposition: A | Discharge status: Home / Self Care | Payer: Self-pay | Source: Home / Self Care | Attending: Gastroenterology

## 2022-01-23 ENCOUNTER — Ambulatory Visit: Payer: Medicare HMO | Admitting: Anesthesiology

## 2022-01-23 ENCOUNTER — Encounter: Payer: Self-pay | Admitting: Gastroenterology

## 2022-01-23 DIAGNOSIS — I129 Hypertensive chronic kidney disease with stage 1 through stage 4 chronic kidney disease, or unspecified chronic kidney disease: Secondary | ICD-10-CM | POA: Diagnosis not present

## 2022-01-23 DIAGNOSIS — K635 Polyp of colon: Secondary | ICD-10-CM

## 2022-01-23 DIAGNOSIS — D125 Benign neoplasm of sigmoid colon: Secondary | ICD-10-CM | POA: Diagnosis not present

## 2022-01-23 DIAGNOSIS — Z8711 Personal history of peptic ulcer disease: Secondary | ICD-10-CM | POA: Insufficient documentation

## 2022-01-23 DIAGNOSIS — Z8719 Personal history of other diseases of the digestive system: Secondary | ICD-10-CM | POA: Diagnosis not present

## 2022-01-23 DIAGNOSIS — N189 Chronic kidney disease, unspecified: Secondary | ICD-10-CM | POA: Diagnosis not present

## 2022-01-23 DIAGNOSIS — K2100 Gastro-esophageal reflux disease with esophagitis, without bleeding: Secondary | ICD-10-CM | POA: Insufficient documentation

## 2022-01-23 DIAGNOSIS — Z09 Encounter for follow-up examination after completed treatment for conditions other than malignant neoplasm: Secondary | ICD-10-CM | POA: Diagnosis not present

## 2022-01-23 DIAGNOSIS — Z86010 Personal history of colonic polyps: Secondary | ICD-10-CM | POA: Diagnosis not present

## 2022-01-23 DIAGNOSIS — K644 Residual hemorrhoidal skin tags: Secondary | ICD-10-CM | POA: Diagnosis not present

## 2022-01-23 DIAGNOSIS — K259 Gastric ulcer, unspecified as acute or chronic, without hemorrhage or perforation: Secondary | ICD-10-CM

## 2022-01-23 HISTORY — PX: FLEXIBLE SIGMOIDOSCOPY: SHX5431

## 2022-01-23 HISTORY — PX: ESOPHAGOGASTRODUODENOSCOPY (EGD) WITH PROPOFOL: SHX5813

## 2022-01-23 SURGERY — SIGMOIDOSCOPY, FLEXIBLE
Anesthesia: General

## 2022-01-23 MED ORDER — PROPOFOL 1000 MG/100ML IV EMUL
INTRAVENOUS | Status: AC
  Filled 2022-01-23: qty 100

## 2022-01-23 MED ORDER — PROPOFOL 500 MG/50ML IV EMUL
INTRAVENOUS | Status: DC | PRN
  Administered 2022-01-23: 175 ug/kg/min via INTRAVENOUS

## 2022-01-23 MED ORDER — LIDOCAINE HCL (CARDIAC) PF 100 MG/5ML IV SOSY
PREFILLED_SYRINGE | INTRAVENOUS | Status: DC | PRN
  Administered 2022-01-23: 45 mg via INTRAVENOUS

## 2022-01-23 MED ORDER — SODIUM CHLORIDE 0.9 % IV SOLN
INTRAVENOUS | Status: DC
  Administered 2022-01-23: 1000 mL via INTRAVENOUS

## 2022-01-23 MED ORDER — PROPOFOL 10 MG/ML IV BOLUS
INTRAVENOUS | Status: DC | PRN
  Administered 2022-01-23: 25 mg via INTRAVENOUS
  Administered 2022-01-23: 65 mg via INTRAVENOUS

## 2022-01-23 MED ORDER — LIDOCAINE HCL (PF) 2 % IJ SOLN
INTRAMUSCULAR | Status: AC
  Filled 2022-01-23: qty 5

## 2022-01-23 NOTE — Anesthesia Postprocedure Evaluation (Signed)
Anesthesia Post Note  Patient: Alexis Nichols  Procedure(s) Performed: FLEXIBLE SIGMOIDOSCOPY ESOPHAGOGASTRODUODENOSCOPY (EGD) WITH PROPOFOL  Patient location during evaluation: Endoscopy Anesthesia Type: General Level of consciousness: awake and alert Pain management: pain level controlled Vital Signs Assessment: post-procedure vital signs reviewed and stable Respiratory status: spontaneous breathing, nonlabored ventilation, respiratory function stable and patient connected to nasal cannula oxygen Cardiovascular status: blood pressure returned to baseline and stable Postop Assessment: no apparent nausea or vomiting Anesthetic complications: no   No notable events documented.   Last Vitals:  Vitals:   01/23/22 1006 01/23/22 1016  BP: (!) 109/57 108/61  Pulse: 61 (!) 57  Resp: (!) 23 19  Temp:    SpO2: 95% 98%    Last Pain:  Vitals:   01/23/22 1016  TempSrc:   PainSc: 0-No pain                 Precious Haws Sencere Symonette

## 2022-01-23 NOTE — H&P (Signed)
Alexis Darby, MD [address]  [place]  [place], [place] [address]  Main: [phone]  Fax: [phone] Pager: [phone]  Primary Care Physician:  Marguerita Merles, MD Primary Gastroenterologist:  Dr. Cephas Nichols  Pre-Procedure History & Physical: HPI:  Alexis Nichols is a 76 y.o. female is here for an endoscopy and flexible sigmoidoscopy.   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([place])    CKD (chronic kidney disease)    Fracture of tibial shaft, left, closed 07/14/2019   GERD (gastroesophageal reflux disease)    HTN (hypertension)    Osteoporosis    Vitamin D insufficiency 07/14/2019    Past Surgical History:  Procedure Laterality Date   BRAIN TUMOR EXCISION  11/2018   excision of acoustic neuroma    COLONOSCOPY WITH PROPOFOL N/A 11/18/2021   Procedure: COLONOSCOPY WITH PROPOFOL;  Surgeon: Lin Landsman, MD;  Location: [place];  Service: Gastroenterology;  Laterality: N/A;   ESOPHAGOGASTRODUODENOSCOPY (EGD) WITH PROPOFOL N/A 11/18/2021   Procedure: ESOPHAGOGASTRODUODENOSCOPY (EGD) WITH PROPOFOL;  Surgeon: Lin Landsman, MD;  Location: [facility] ENDOSCOPY;  Service: Gastroenterology;  Laterality: N/A;   EXTERNAL FIXATION LEG Left 07/12/2019   Procedure: EXTERNAL FIXATION LEG;  Surgeon: Marchia Bond, MD;  Location: [place];  Service: Orthopedics;  Laterality: Left;   EXTERNAL FIXATION REMOVAL Left 07/15/2019   Procedure: Removal External Fixation Leg;  Surgeon: Altamese [place], MD;  Location: [place];  Service: Orthopedics;  Laterality: Left;   FRACTURE SURGERY     ORIF TIBIA FRACTURE Left 07/15/2019   Procedure: OPEN REDUCTION INTERNAL FIXATION (ORIF) TIBIA FRACTURE;  Surgeon: Altamese Custer, MD;  Location: [place];  Service: Orthopedics;  Laterality: Left;   ORIF TIBIA PLATEAU Left 07/15/2019   Procedure: OPEN REDUCTION INTERNAL FIXATION (ORIF) TIBIAL PLATEAU;  Surgeon: Altamese [place], MD;  Location: [place];  Service: Orthopedics;   Laterality: Left;    Prior to Admission medications   Medication Sig Start Date End Date Taking? Authorizing Provider  cetirizine (ZYRTEC) 10 MG tablet Take 1 tablet by mouth daily. 10/06/21  Yes [provider]  docusate sodium (COLACE) 100 MG capsule Take 1 capsule (100 mg total) by mouth 2 (two) times daily. 11/19/21  Yes Geradine Girt, DO  feeding supplement, ENSURE ENLIVE, (ENSURE ENLIVE) LIQD Take 237 mLs by mouth daily at 8 pm. 07/24/19  Yes Ainsley Spinner, PA-C  magnesium oxide (MAG-OX) 400 MG tablet Take 1 tablet by mouth 2 (two) times daily. 12/07/21  Yes [provider]  metoprolol tartrate (LOPRESSOR) 25 MG tablet Take 25 mg by mouth daily.  01/30/19  Yes [provider]  Multiple Vitamins-Minerals (PRESERVISION AREDS PO) Take by mouth.   Yes [provider]  omeprazole (PRILOSEC) 40 MG capsule Take 40 mg by mouth daily. 07/03/19  Yes [provider]  oxybutynin (DITROPAN) 5 MG tablet Take 1 tablet by mouth 3 (three) times daily. 10/11/21  Yes [provider]  spironolactone (ALDACTONE) 50 MG tablet Take 50 mg by mouth daily.  01/30/19  Yes [provider]  triamcinolone cream (KENALOG) 0.5 % Apply topically. 06/30/21  Yes [provider]  acetaminophen (TYLENOL) 500 MG tablet Take 1 tablet (500 mg total) by mouth every 12 (twelve) hours. 07/24/19   Ainsley Spinner, PA-C  ciprofloxacin (CIPRO) 500 MG tablet Take 500 mg by mouth 2 (two) times daily. Patient not taking: Reported on 01/23/2022    [provider]    Allergies as of 11/23/2021   (  No Known Allergies)    History reviewed. No pertinent family history.  Social History   Socioeconomic History   Marital status: Widowed    Spouse name: Not on file   Number of children: Not on file   Years of education: Not on file   Highest education level: Not on file  Occupational History   Not on file  Tobacco Use   Smoking status: Never   Smokeless tobacco:  Never  Vaping Use   Vaping Use: Never used  Substance and Sexual Activity   Alcohol use: Never   Drug use: Never   Sexual activity: Not on file  Other Topics Concern   Not on file  Social History Narrative   Not on file   Social Determinants of Health   Financial Resource Strain: Not on file  Food Insecurity: Not on file  Transportation Needs: Not on file  Physical Activity: Not on file  Stress: Not on file  Social Connections: Not on file  Intimate Partner Violence: Not on file    Review of Systems: See HPI, otherwise negative ROS  Physical Exam: BP (!) 126/50   Pulse (!) 52   Temp (!) 96.8 F (36 C) (Temporal)   Resp 16   Ht '5\' 2"'$  (1.575 m)   Wt 46.4 kg   SpO2 100%   BMI 18.69 kg/m  General:   Alert,  pleasant and cooperative in NAD Head:  Normocephalic and atraumatic. Neck:  Supple; no masses or thyromegaly. Lungs:  Clear throughout to auscultation.    Heart:  Regular rate and rhythm. Abdomen:  Soft, nontender and nondistended. Normal bowel sounds, without guarding, and without rebound.   Neurologic:  Alert and  oriented x4;  grossly normal neurologically.  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  Questions have been answered.  All parties agreeable.   Sherri Sear, MD  01/23/2022, 9:04 AM

## 2022-01-23 NOTE — Op Note (Signed)
[facility] Gastroenterology Patient Name: Alexis Nichols Procedure Date: 01/23/2022 9:18 AM MRN: [id number] Account #: [id number] Date of Birth: September 30, 1945 Admit Type: Outpatient Age: 76 Room: [facility] ENDO ROOM 4 Gender: Female Note Status: Finalized Instrument Name: Upper Endoscope [id number] Procedure:             Upper GI endoscopy Indications:           Follow-up of esophagitis Providers:             Lin Landsman MD, MD Referring MD:          No Local Md, MD (Referring MD) Medicines:             General Anesthesia Complications:         No immediate complications. Estimated blood loss: None. Procedure:             Pre-Anesthesia Assessment:                        - Prior to the procedure, a History and Physical was                         performed, and patient medications and allergies were                         reviewed. The patient is competent. The risks and                         benefits of the procedure and the sedation options and                         risks were discussed with the patient. All questions                         were answered and informed consent was obtained.                         Patient identification and proposed procedure were                         verified by the physician, the nurse, the                         anesthesiologist, the anesthetist and the technician                         in the pre-procedure area in the procedure room in the                         endoscopy suite. Mental Status Examination: alert and                         oriented. Airway Examination: normal oropharyngeal                         airway and neck mobility. Respiratory Examination:                         clear to auscultation. CV Examination: normal.  Prophylactic Antibiotics: The patient does not require                         prophylactic antibiotics. Prior Anticoagulants: The                         patient has  taken no previous anticoagulant or                         antiplatelet agents. ASA Grade Assessment: III - A                         patient with severe systemic disease. After reviewing                         the risks and benefits, the patient was deemed in                         satisfactory condition to undergo the procedure. The                         anesthesia plan was to use general anesthesia.                         Immediately prior to administration of medications,                         the patient was re-assessed for adequacy to receive                         sedatives. The heart rate, respiratory rate, oxygen                         saturations, blood pressure, adequacy of pulmonary                         ventilation, and response to care were monitored                         throughout the procedure. The physical status of the                         patient was re-assessed after the procedure.                        After obtaining informed consent, the endoscope was                         passed under direct vision. Throughout the procedure,                         the patient's blood pressure, pulse, and oxygen                         saturations were monitored continuously. The Endoscope                         was introduced through the mouth, and advanced to the  second part of duodenum. The upper GI endoscopy was                         accomplished without difficulty. The patient tolerated                         the procedure well. Findings:      The duodenal bulb and second portion of the duodenum were normal.      The entire examined stomach was normal.      LA Grade B (one or more mucosal breaks greater than 5 mm, not extending       between the tops of two mucosal folds) esophagitis with no bleeding was       found in the lower third of the esophagus. Impression:            - Normal duodenal bulb and second portion of the                          duodenum.                        - Normal stomach.                        - LA Grade B reflux esophagitis with no bleeding.                        - No specimens collected. Recommendation:        - Use Prilosec (omeprazole) 40 mg PO daily for 6                         months.                        - Follow an antireflux regimen. Procedure Code(s):     --- Professional ---                        [phone], Esophagogastroduodenoscopy, flexible,                         transoral; diagnostic, including collection of                         specimen(s) by brushing or washing, when performed                         (separate procedure) Diagnosis Code(s):     --- Professional ---                        K21.00, Gastro-esophageal reflux disease with                         esophagitis, without bleeding CPT copyright 2019 American Medical Association. All rights reserved. The codes documented in this report are preliminary and upon coder review may  be revised to meet current compliance requirements. Dr. Ulyess Mort Lin Landsman MD, MD 01/23/2022 9:30:31 AM This report has been signed electronically. Number of Addenda: 0 Note Initiated On: 01/23/2022 9:18 AM Estimated Blood Loss:  Estimated blood loss: none.  [facility]

## 2022-01-23 NOTE — Transfer of Care (Signed)
Immediate Anesthesia Transfer of Care Note  Patient: Alexis Nichols  Procedure(s) Performed: FLEXIBLE SIGMOIDOSCOPY ESOPHAGOGASTRODUODENOSCOPY (EGD) WITH PROPOFOL  Patient Location: PACU and Endoscopy Unit  Anesthesia Type:General  Level of Consciousness: awake  Airway & Oxygen Therapy: Patient Spontanous Breathing  Post-op Assessment: Report given to RN  Post vital signs: stable  Last Vitals:  Vitals Value Taken Time  BP 107/41 01/23/22 0956  Temp 36.4 C 01/23/22 0956  Pulse 66 01/23/22 0956  Resp 20 01/23/22 0956  SpO2 100 % 01/23/22 0956    Last Pain:  Vitals:   01/23/22 0956  TempSrc: Temporal  PainSc: 0-No pain         Complications: No notable events documented.

## 2022-01-23 NOTE — Op Note (Signed)
[facility] Gastroenterology Patient Name: Alexis Nichols Procedure Date: 01/23/2022 9:17 AM MRN: [id number] Account #: [id number] Date of Birth: Dec 01, 1945 Admit Type: Outpatient Age: 76 Room: [facility] ENDO ROOM 4 Gender: Female Note Status: Finalized Instrument Name: Upper Endoscope [id number] Procedure:             Flexible Sigmoidoscopy Indications:           Surveillance: Personal history of piecemeal removal of                         large sessile adenoma on last colonoscopy (less than 6                         months ago) Providers:             Lin Landsman MD, MD Referring MD:          No Local Md, MD (Referring MD) Medicines:             General Anesthesia Complications:         No immediate complications. Estimated blood loss:                         Minimal. Procedure:             Pre-Anesthesia Assessment:                        - Prior to the procedure, a History and Physical was                         performed, and patient medications and allergies were                         reviewed. The patient is competent. The risks and                         benefits of the procedure and the sedation options and                         risks were discussed with the patient. All questions                         were answered and informed consent was obtained.                         Patient identification and proposed procedure were                         verified by the physician, the nurse, the                         anesthesiologist, the anesthetist and the technician                         in the pre-procedure area in the procedure room in the                         endoscopy suite. Mental Status Examination: alert and  oriented. Airway Examination: normal oropharyngeal                         airway and neck mobility. Respiratory Examination:                         clear to auscultation. CV Examination: normal.                          Prophylactic Antibiotics: The patient does not require                         prophylactic antibiotics. Prior Anticoagulants: The                         patient has taken no previous anticoagulant or                         antiplatelet agents. ASA Grade Assessment: III - A                         patient with severe systemic disease. After reviewing                         the risks and benefits, the patient was deemed in                         satisfactory condition to undergo the procedure. The                         anesthesia plan was to use general anesthesia.                         Immediately prior to administration of medications,                         the patient was re-assessed for adequacy to receive                         sedatives. The heart rate, respiratory rate, oxygen                         saturations, blood pressure, adequacy of pulmonary                         ventilation, and response to care were monitored                         throughout the procedure. The physical status of the                         patient was re-assessed after the procedure.                        After obtaining informed consent, the scope was passed                         under direct vision. The Endoscope was introduced  through the anus and advanced to the the descending                         colon. The flexible sigmoidoscopy was accomplished                         without difficulty. The patient tolerated the                         procedure well. The quality of the bowel preparation                         was adequate. Findings:      The perianal and digital rectal examinations were normal. Pertinent       negatives include normal sphincter tone and no palpable rectal lesions.      A 10 mm polyp was found in the sigmoid colon. The polyp was       pedunculated. The polyp was removed with a hot snare. Resection and       retrieval were  complete. Estimated blood loss: none.      A 10 mm post polypectomy scar was found in the sigmoid colon. There was       residual polypoid tissue. Biopsies were taken with a cold forceps for       histology. Estimated blood loss was minimal.      Non-bleeding external hemorrhoids were found during retroflexion. The       hemorrhoids were moderate. Impression:            - One 10 mm polyp in the sigmoid colon, removed with a                         hot snare. Resected and retrieved.                        - Post-polypectomy scar in the sigmoid colon. Biopsied.                        - Non-bleeding external hemorrhoids. Recommendation:        - Discharge patient to home (with escort).                        - Resume previous diet today.                        - Await pathology results.                        - Perform a colonoscopy after studies are complete and                         based on pathology results. Procedure Code(s):     --- Professional ---                        [phone], Sigmoidoscopy, flexible; with removal of                         tumor(s), polyp(s), or other lesion(s) by snare  technique                        45331, 59, Sigmoidoscopy, flexible; with biopsy,                         single or multiple Diagnosis Code(s):     --- Professional ---                        K63.5, Polyp of colon                        Z98.890, Other specified postprocedural states                        K64.4, Residual hemorrhoidal skin tags                        Z09, Encounter for follow-up examination after                         completed treatment for conditions other than                         malignant neoplasm                        Z86.010, Personal history of colonic polyps CPT copyright 2019 [employer]. All rights reserved. The codes documented in this report are preliminary and upon coder review may  be revised to meet current compliance  requirements. Dr. Ulyess Mort Lin Landsman MD, MD 01/23/2022 9:54:43 AM This report has been signed electronically. Number of Addenda: 0 Note Initiated On: 01/23/2022 9:17 AM Scope Withdrawal Time: 0 hours 14 minutes 33 seconds  Total Procedure Duration: 0 hours 17 minutes 54 seconds  Estimated Blood Loss:  Estimated blood loss was minimal.      [facility]

## 2022-01-23 NOTE — Anesthesia Preprocedure Evaluation (Signed)
Anesthesia Evaluation  Patient identified by MRN, date of birth, ID band Patient awake    Reviewed: Allergy & Precautions, NPO status , Patient's Chart, lab work & pertinent test results  History of Anesthesia Complications Negative for: history of anesthetic complications  Airway Mallampati: III  TM Distance: <3 FB Neck ROM: full    Dental  (+) Upper Dentures, Lower Dentures   Pulmonary neg pulmonary ROS, neg shortness of breath,    Pulmonary exam normal        Cardiovascular Exercise Tolerance: Good hypertension, (-) angina+ CAD  Normal cardiovascular exam     Neuro/Psych  Neuromuscular disease negative psych ROS   GI/Hepatic Neg liver ROS, PUD, GERD  Controlled,  Endo/Other  negative endocrine ROS  Renal/GU Renal disease  negative genitourinary   Musculoskeletal   Abdominal   Peds  Hematology negative hematology ROS (+)   Anesthesia Other Findings Past Medical History: No date: Acoustic neuroma (HCC) No date: CKD (chronic kidney disease) 07/14/2019: Fracture of tibial shaft, left, closed No date: GERD (gastroesophageal reflux disease) No date: HTN (hypertension) No date: Osteoporosis 07/14/2019: Vitamin D insufficiency  Past Surgical History: 11/2018: BRAIN TUMOR EXCISION     Comment:  excision of acoustic neuroma  11/18/2021: COLONOSCOPY WITH PROPOFOL; N/A     Comment:  Procedure: COLONOSCOPY WITH PROPOFOL;  Surgeon: Lin Landsman, MD;  Location: [facility] ENDOSCOPY;  Service:               Gastroenterology;  Laterality: N/A; 11/18/2021: ESOPHAGOGASTRODUODENOSCOPY (EGD) WITH PROPOFOL; N/A     Comment:  Procedure: ESOPHAGOGASTRODUODENOSCOPY (EGD) WITH               PROPOFOL;  Surgeon: Lin Landsman, MD;  Location:               [facility] ENDOSCOPY;  Service: Gastroenterology;  Laterality:               N/A; 07/12/2019: EXTERNAL FIXATION LEG; Left     Comment:  Procedure: EXTERNAL  FIXATION LEG;  Surgeon: Marchia Bond, MD;  Location: [place];  Service: Orthopedics;                Laterality: Left; 07/15/2019: EXTERNAL FIXATION REMOVAL; Left     Comment:  Procedure: Removal External Fixation Leg;  Surgeon:               Altamese New Ringgold, MD;  Location: [place];  Service:               Orthopedics;  Laterality: Left; No date: FRACTURE SURGERY 07/15/2019: ORIF TIBIA FRACTURE; Left     Comment:  Procedure: OPEN REDUCTION INTERNAL FIXATION (ORIF) TIBIA              FRACTURE;  Surgeon: Altamese Shamrock, MD;  Location: [place];              Service: Orthopedics;  Laterality: Left; 07/15/2019: ORIF TIBIA PLATEAU; Left     Comment:  Procedure: OPEN REDUCTION INTERNAL FIXATION (ORIF)               TIBIAL PLATEAU;  Surgeon: Altamese Braselton, MD;  Location:               [place];  Service: Orthopedics;  Laterality: Left;  BMI    Body Mass Index: 18.69 kg/m  Reproductive/Obstetrics negative OB ROS                             Anesthesia Physical Anesthesia Plan  ASA: 3  Anesthesia Plan: General   Post-op Pain Management:    Induction: Intravenous  PONV Risk Score and Plan: Propofol infusion and TIVA  Airway Management Planned: Natural Airway and Nasal Cannula  Additional Equipment:   Intra-op Plan:   Post-operative Plan:   Informed Consent: I have reviewed the patients History and Physical, chart, labs and discussed the procedure including the risks, benefits and alternatives for the proposed anesthesia with the patient or authorized representative who has indicated his/her understanding and acceptance.     Dental Advisory Given  Plan Discussed with: Anesthesiologist, CRNA and Surgeon  Anesthesia Plan Comments: (Patient consented for risks of anesthesia including but not limited to:  - adverse reactions to medications - risk of airway placement if required - damage to eyes, teeth, lips or other oral mucosa - nerve damage  due to positioning  - sore throat or hoarseness - Damage to heart, brain, nerves, lungs, other parts of body or loss of life  Patient voiced understanding.)        Anesthesia Quick Evaluation

## 2022-01-24 ENCOUNTER — Encounter: Payer: Self-pay | Admitting: Gastroenterology

## 2022-01-24 LAB — SURGICAL PATHOLOGY

## 2022-03-02 ENCOUNTER — Ambulatory Visit: Payer: Medicare HMO | Admitting: Podiatry

## 2022-03-05 NOTE — Progress Notes (Signed)
MRN : [id number]  Alexis Nichols is a 76 y.o. ([birth year]) female who presents with chief complaint of check carotid arteries.  History of Present Illness:   The patient is seen for evaluation of carotid stenosis. The carotid stenosis was identified after a bruit was identified.  The patient denies amaurosis fugax. There is no recent history of TIA symptoms or focal motor deficits. There is no prior documented CVA.  There is no history of migraine headaches. There is no history of seizures.  The patient is taking enteric-coated aspirin 81 mg daily.  No recent shortening of the patient's walking distance or new symptoms consistent with claudication.  No history of rest pain symptoms. No new ulcers or wounds of the lower extremities have occurred.  There is no history of DVT, PE or superficial thrombophlebitis. No recent episodes of angina or shortness of breath documented.    Stat duplex today shows RICA 99-24% and LICA 26-83%  No outpatient medications have been marked as taking for the 03/06/22 encounter (Appointment) with Delana Meyer, Dolores Lory, MD.    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[facility])    CKD (chronic kidney disease)    Fracture of tibial shaft, left, closed 07/14/2019   GERD (gastroesophageal reflux disease)    HTN (hypertension)    Osteoporosis    Vitamin D insufficiency 07/14/2019    Past Surgical History:  Procedure Laterality Date   BRAIN TUMOR EXCISION  11/2018   excision of acoustic neuroma    COLONOSCOPY WITH PROPOFOL N/A 11/18/2021   Procedure: COLONOSCOPY WITH PROPOFOL;  Surgeon: Lin Landsman, MD;  Location: [place];  Service: Gastroenterology;  Laterality: N/A;   ESOPHAGOGASTRODUODENOSCOPY (EGD) WITH PROPOFOL N/A 11/18/2021   Procedure: ESOPHAGOGASTRODUODENOSCOPY (EGD) WITH PROPOFOL;  Surgeon: Lin Landsman, MD;  Location[facility] ENDOSCOPY;  Service: Gastroenterology;  Laterality: N/A;    ESOPHAGOGASTRODUODENOSCOPY (EGD) WITH PROPOFOL N/A 01/23/2022   Procedure: ESOPHAGOGASTRODUODENOSCOPY (EGD) WITH PROPOFOL;  Surgeon: Lin Landsman, MD;  Location: [facility] ENDOSCOPY;  Service: Gastroenterology;  Laterality: N/A;   EXTERNAL FIXATION LEG Left 07/12/2019   Procedure: EXTERNAL FIXATION LEG;  Surgeon: Marchia Bond, MD;  Location: [place];  Service: Orthopedics;  Laterality: Left;   EXTERNAL FIXATION REMOVAL Left 07/15/2019   Procedure: Removal External Fixation Leg;  Surgeon: Altamese Godley, MD;  Location: [place];  Service: Orthopedics;  Laterality: Left;   FLEXIBLE SIGMOIDOSCOPY N/A 01/23/2022   Procedure: FLEXIBLE SIGMOIDOSCOPY;  Surgeon: Lin Landsman, MD;  Location: [facility] ENDOSCOPY;  Service: Gastroenterology;  Laterality: N/A;  [place] TRANSPORTATION - 8:15 AM ARRIVAL [place] - [phone]   FRACTURE SURGERY     ORIF TIBIA FRACTURE Left 07/15/2019   Procedure: OPEN REDUCTION INTERNAL FIXATION (ORIF) TIBIA FRACTURE;  Surgeon: Altamese Keewatin, MD;  Location: [place];  Service: Orthopedics;  Laterality: Left;   ORIF TIBIA PLATEAU Left 07/15/2019   Procedure: OPEN REDUCTION INTERNAL FIXATION (ORIF) TIBIAL PLATEAU;  Surgeon: Altamese Paonia, MD;  Location: [place];  Service: Orthopedics;  Laterality: Left;    Social History Social History   Tobacco Use   Smoking status: Never   Smokeless tobacco: Never  Vaping Use   Vaping Use: Never used  Substance Use Topics   Alcohol use: Never   Drug use: Never    Family History No family history on file.  Allergies  Allergen Reactions   Beta Adrenergic Blockers Other (See Comments)     REVIEW OF SYSTEMS (  Negative unless checked)  Constitutional: '[]'$ Weight loss  '[]'$ Fever  '[]'$ Chills Cardiac: '[]'$ Chest pain   '[]'$ Chest pressure   '[]'$ Palpitations   '[]'$ Shortness of breath when laying flat   '[]'$ Shortness of breath with exertion. Vascular:  '[x]'$ Pain in legs with walking   '[]'$ Pain in legs at rest  '[]'$ History of  DVT   '[]'$ Phlebitis   '[]'$ Swelling in legs   '[]'$ Varicose veins   '[]'$ Non-healing ulcers Pulmonary:   '[]'$ Uses home oxygen   '[]'$ Productive cough   '[]'$ Hemoptysis   '[]'$ Wheeze  '[]'$ COPD   '[]'$ Asthma Neurologic:  '[]'$ Dizziness   '[]'$ Seizures   '[]'$ History of stroke   '[]'$ History of TIA  '[]'$ Aphasia   '[]'$ Vissual changes   '[]'$ Weakness or numbness in arm   '[]'$ Weakness or numbness in leg Musculoskeletal:   '[]'$ Joint swelling   '[]'$ Joint pain   '[]'$ Low back pain Hematologic:  '[]'$ Easy bruising  '[]'$ Easy bleeding   '[]'$ Hypercoagulable state   '[]'$ Anemic Gastrointestinal:  '[]'$ Diarrhea   '[]'$ Vomiting  '[x]'$ Gastroesophageal reflux/heartburn   '[]'$ Difficulty swallowing. Genitourinary:  '[]'$ Chronic kidney disease   '[]'$ Difficult urination  '[]'$ Frequent urination   '[]'$ Blood in urine Skin:  '[]'$ Rashes   '[]'$ Ulcers  Psychological:  '[]'$ History of anxiety   '[]'$  History of major depression.  Physical Examination  There were no vitals filed for this visit. There is no height or weight on file to calculate BMI. Gen: WD/WN, NAD Head: [place]/AT, No temporalis wasting.  Ear/Nose/Throat: Hearing grossly intact, nares w/o erythema or drainage Eyes: PER, EOMI, sclera nonicteric.  Neck: Supple, no masses.  No bruit or JVD.  Pulmonary:  Good air movement, no audible wheezing, no use of accessory muscles.  Cardiac: RRR, normal S1, S2, no Murmurs. Vascular:  carotid bruit noted Vessel Right Left  Radial Palpable Palpable  Carotid  Palpable  Palpable  Subclav  Palpable Palpable  Gastrointestinal: soft, non-distended. No guarding/no peritoneal signs.  Musculoskeletal: M/S 5/5 throughout.  No visible deformity.  Neurologic: CN 2-12 intact. Pain and light touch intact in extremities.  Symmetrical.  Speech is fluent. Motor exam as listed above. Psychiatric: Judgment intact, Mood & affect appropriate for pt's clinical situation. Dermatologic: No rashes or ulcers noted.  No changes consistent with cellulitis.   CBC Lab Results  Component Value Date   WBC 8.4 11/19/2021   HGB 12.7  11/19/2021   HCT 39.8 11/19/2021   MCV 92.8 11/19/2021   PLT 285 11/19/2021    BMET    Component Value Date/Time   NA 140 11/19/2021 0515   NA 136 12/25/2011 0531   K 3.7 11/19/2021 0515   K 4.1 12/25/2011 0531   CL 105 11/19/2021 0515   CL 108 (H) 12/25/2011 0531   CO2 29 11/19/2021 0515   CO2 21 12/25/2011 0531   GLUCOSE 98 11/19/2021 0515   GLUCOSE 78 12/25/2011 0531   BUN 7 (L) 11/19/2021 0515   BUN 13 12/25/2011 0531   CREATININE 0.81 11/19/2021 0515   CREATININE 1.27 12/25/2011 0531   CALCIUM 9.1 11/19/2021 0515   CALCIUM 8.8 07/17/2019 0233   GFRNONAA >60 11/19/2021 0515   GFRNONAA 44 (L) 12/25/2011 0531   GFRAA 56 (L) 01/22/2020 1127   GFRAA 51 (L) 12/25/2011 0531   CrCl cannot be calculated (Patient's most recent lab result is older than the maximum 21 days allowed.).  COAG Lab Results  Component Value Date   INR 1.0 07/14/2019   INR 1.3 12/25/2011   INR 4.8 (HH) 12/24/2011    Radiology No results found.   Assessment/Plan 1. Bilateral carotid artery stenosis Recommend:  Given  the patient's asymptomatic subcritical stenosis no further invasive testing or surgery at this time.  Duplex ultrasound shows RICA 09-23% and LICA 30-07%.  Continue antiplatelet therapy as prescribed Continue management of CAD, HTN and Hyperlipidemia Healthy heart diet,  encouraged exercise at least 4 times per week Follow up in 6 months with duplex ultrasound and physical exam    2. Coronary artery disease involving native coronary artery of native heart, unspecified whether angina present Continue cardiac and antihypertensive medications as already ordered and reviewed, no changes at this time.  Continue statin as ordered and reviewed, no changes at this time  Nitrates PRN for chest pain   3. Primary hypertension Continue antihypertensive medications as already ordered, these medications have been reviewed and there are no changes at this time.   4. Gastroesophageal  reflux disease, unspecified whether esophagitis present Continue PPI as already ordered, this medication has been reviewed and there are no changes at this time.  Avoidence of caffeine and alcohol  Moderate elevation of the head of the bed      Hortencia Pilar, MD  03/05/2022 4:43 PM

## 2022-03-06 ENCOUNTER — Other Ambulatory Visit (INDEPENDENT_AMBULATORY_CARE_PROVIDER_SITE_OTHER): Payer: Self-pay | Admitting: Vascular Surgery

## 2022-03-06 ENCOUNTER — Encounter (INDEPENDENT_AMBULATORY_CARE_PROVIDER_SITE_OTHER): Payer: Self-pay | Admitting: Vascular Surgery

## 2022-03-06 ENCOUNTER — Ambulatory Visit (INDEPENDENT_AMBULATORY_CARE_PROVIDER_SITE_OTHER): Payer: Medicare HMO

## 2022-03-06 ENCOUNTER — Ambulatory Visit (INDEPENDENT_AMBULATORY_CARE_PROVIDER_SITE_OTHER): Payer: Medicare HMO | Admitting: Vascular Surgery

## 2022-03-06 VITALS — BP 144/66 | HR 73 | Resp 17 | Ht 62.4 in | Wt 101.0 lb

## 2022-03-06 DIAGNOSIS — I10 Essential (primary) hypertension: Secondary | ICD-10-CM

## 2022-03-06 DIAGNOSIS — K219 Gastro-esophageal reflux disease without esophagitis: Secondary | ICD-10-CM | POA: Diagnosis not present

## 2022-03-06 DIAGNOSIS — I2510 Atherosclerotic heart disease of native coronary artery without angina pectoris: Secondary | ICD-10-CM | POA: Diagnosis not present

## 2022-03-06 DIAGNOSIS — I6523 Occlusion and stenosis of bilateral carotid arteries: Secondary | ICD-10-CM | POA: Diagnosis not present

## 2022-03-12 ENCOUNTER — Encounter (INDEPENDENT_AMBULATORY_CARE_PROVIDER_SITE_OTHER): Payer: Self-pay | Admitting: Vascular Surgery

## 2022-04-20 ENCOUNTER — Ambulatory Visit (INDEPENDENT_AMBULATORY_CARE_PROVIDER_SITE_OTHER): Payer: Medicare HMO | Admitting: Podiatry

## 2022-04-20 ENCOUNTER — Encounter: Payer: Self-pay | Admitting: Podiatry

## 2022-04-20 DIAGNOSIS — B351 Tinea unguium: Secondary | ICD-10-CM | POA: Diagnosis not present

## 2022-04-20 DIAGNOSIS — M79675 Pain in left toe(s): Secondary | ICD-10-CM | POA: Diagnosis not present

## 2022-04-20 DIAGNOSIS — M79674 Pain in right toe(s): Secondary | ICD-10-CM | POA: Diagnosis not present

## 2022-04-20 NOTE — Progress Notes (Signed)

## 2022-04-26 ENCOUNTER — Other Ambulatory Visit: Payer: Self-pay | Admitting: Family Medicine

## 2022-04-26 DIAGNOSIS — Z780 Asymptomatic menopausal state: Secondary | ICD-10-CM

## 2022-05-02 ENCOUNTER — Ambulatory Visit: Payer: Medicare HMO | Admitting: Gastroenterology

## 2022-05-15 ENCOUNTER — Encounter (INDEPENDENT_AMBULATORY_CARE_PROVIDER_SITE_OTHER): Payer: Self-pay

## 2022-06-15 DIAGNOSIS — I493 Ventricular premature depolarization: Secondary | ICD-10-CM

## 2022-06-15 HISTORY — DX: Ventricular premature depolarization: I49.3

## 2022-07-27 ENCOUNTER — Ambulatory Visit (INDEPENDENT_AMBULATORY_CARE_PROVIDER_SITE_OTHER): Payer: Medicare HMO | Admitting: Podiatry

## 2022-07-27 ENCOUNTER — Encounter: Payer: Self-pay | Admitting: Podiatry

## 2022-07-27 VITALS — BP 132/49 | HR 64

## 2022-07-27 DIAGNOSIS — M79675 Pain in left toe(s): Secondary | ICD-10-CM

## 2022-07-27 DIAGNOSIS — N183 Chronic kidney disease, stage 3 unspecified: Secondary | ICD-10-CM

## 2022-07-27 DIAGNOSIS — M79674 Pain in right toe(s): Secondary | ICD-10-CM | POA: Diagnosis not present

## 2022-07-27 DIAGNOSIS — B351 Tinea unguium: Secondary | ICD-10-CM

## 2022-07-27 NOTE — Progress Notes (Signed)

## 2022-08-31 ENCOUNTER — Other Ambulatory Visit (INDEPENDENT_AMBULATORY_CARE_PROVIDER_SITE_OTHER): Payer: Self-pay | Admitting: Vascular Surgery

## 2022-08-31 DIAGNOSIS — I6523 Occlusion and stenosis of bilateral carotid arteries: Secondary | ICD-10-CM

## 2022-09-04 ENCOUNTER — Ambulatory Visit (INDEPENDENT_AMBULATORY_CARE_PROVIDER_SITE_OTHER): Payer: Medicare HMO | Admitting: Vascular Surgery

## 2022-09-04 ENCOUNTER — Encounter (INDEPENDENT_AMBULATORY_CARE_PROVIDER_SITE_OTHER): Payer: Medicare HMO

## 2022-09-27 ENCOUNTER — Ambulatory Visit (INDEPENDENT_AMBULATORY_CARE_PROVIDER_SITE_OTHER): Payer: Medicare HMO | Admitting: Nurse Practitioner

## 2022-09-27 ENCOUNTER — Ambulatory Visit (INDEPENDENT_AMBULATORY_CARE_PROVIDER_SITE_OTHER): Payer: Medicare HMO

## 2022-09-27 ENCOUNTER — Encounter (INDEPENDENT_AMBULATORY_CARE_PROVIDER_SITE_OTHER): Payer: Self-pay | Admitting: Nurse Practitioner

## 2022-09-27 VITALS — BP 125/67 | HR 62 | Resp 15 | Wt 102.6 lb

## 2022-09-27 DIAGNOSIS — I10 Essential (primary) hypertension: Secondary | ICD-10-CM | POA: Diagnosis not present

## 2022-09-27 DIAGNOSIS — I6523 Occlusion and stenosis of bilateral carotid arteries: Secondary | ICD-10-CM

## 2022-09-27 DIAGNOSIS — K219 Gastro-esophageal reflux disease without esophagitis: Secondary | ICD-10-CM

## 2022-10-17 ENCOUNTER — Encounter (INDEPENDENT_AMBULATORY_CARE_PROVIDER_SITE_OTHER): Payer: Self-pay | Admitting: Nurse Practitioner

## 2022-10-17 NOTE — Progress Notes (Signed)
MRN : [id number]  Alexis Nichols is a 77 y.o. (1945-08-16) female who presents with chief complaint of check carotid arteries.  History of Present Illness:   The patient is seen for evaluation of carotid stenosis. The carotid stenosis was identified after a bruit was identified.  The patient denies amaurosis fugax. There is no recent history of TIA symptoms or focal motor deficits. There is no prior documented CVA.  There is no history of migraine headaches. There is no history of seizures.  The patient is taking enteric-coated aspirin 81 mg daily.  No recent shortening of the patient's walking distance or new symptoms consistent with claudication.  No history of rest pain symptoms. No new ulcers or wounds of the lower extremities have occurred.  There is no history of DVT, PE or superficial thrombophlebitis. No recent episodes of angina or shortness of breath documented.    Stat duplex today shows 40 to 59% ICA stenosis bilaterally.  Great flow in the bilateral vertebral arteries.  No flow hemodynamics in the bilateral subclavian arteries.  Previous study showed 60 to 79% stenosis of the right ICA with 40 to 59% stenosis of the left  Current Meds  Medication Sig   acetaminophen (TYLENOL) 500 MG tablet Take 1 tablet (500 mg total) by mouth every 12 (twelve) hours.   cetirizine (ZYRTEC) 10 MG tablet Take 1 tablet by mouth daily.   feeding supplement, ENSURE ENLIVE, (ENSURE ENLIVE) LIQD Take 237 mLs by mouth daily at 8 pm.   magnesium oxide (MAG-OX) 400 MG tablet Take 1 tablet by mouth 2 (two) times daily.   Multiple Vitamins-Minerals (PRESERVISION AREDS PO) Take by mouth.   omeprazole (PRILOSEC) 40 MG capsule Take 40 mg by mouth daily.   oxybutynin (DITROPAN) 5 MG tablet Take 1 tablet by mouth 3 (three) times daily.   rosuvastatin (CRESTOR) 5 MG tablet Take by mouth.   spironolactone (ALDACTONE) 50 MG tablet Take 50 mg by mouth daily.    triamcinolone cream (KENALOG) 0.5 % Apply  topically.    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CKD (chronic kidney disease)    Fracture of tibial shaft, left, closed 07/14/2019   GERD (gastroesophageal reflux disease)    HTN (hypertension)    Osteoporosis    Vitamin D insufficiency 07/14/2019    Past Surgical History:  Procedure Laterality Date   BRAIN TUMOR EXCISION  11/2018   excision of acoustic neuroma    COLONOSCOPY WITH PROPOFOL N/A 11/18/2021   Procedure: COLONOSCOPY WITH PROPOFOL;  Surgeon: Lin Landsman, MD;  Location: [place];  Service: Gastroenterology;  Laterality: N/A;   ESOPHAGOGASTRODUODENOSCOPY (EGD) WITH PROPOFOL N/A 11/18/2021   Procedure: ESOPHAGOGASTRODUODENOSCOPY (EGD) WITH PROPOFOL;  Surgeon: Lin Landsman, MD;  Location: [facility] ENDOSCOPY;  Service: Gastroenterology;  Laterality: N/A;   ESOPHAGOGASTRODUODENOSCOPY (EGD) WITH PROPOFOL N/A 01/23/2022   Procedure: ESOPHAGOGASTRODUODENOSCOPY (EGD) WITH PROPOFOL;  Surgeon: Lin Landsman, MD;  Location: [facility] ENDOSCOPY;  Service: Gastroenterology;  Laterality: N/A;   EXTERNAL FIXATION LEG Left 07/12/2019   Procedure: EXTERNAL FIXATION LEG;  Surgeon: Marchia Bond, MD;  Location: [place];  Service: Orthopedics;  Laterality: Left;   EXTERNAL FIXATION REMOVAL Left 07/15/2019   Procedure: Removal External Fixation Leg;  Surgeon: Altamese Aberdeen, MD;  Location: [place];  Service: Orthopedics;  Laterality: Left;   FLEXIBLE SIGMOIDOSCOPY N/A 01/23/2022   Procedure: FLEXIBLE SIGMOIDOSCOPY;  Surgeon: Lin Landsman, MD;  Location: [facility] ENDOSCOPY;  Service: Gastroenterology;  Laterality:  N/A;  [place] [facility]SP[facility]ATION - 8:15 AM ARRIVAL TERRI BARR WILL MEET HER & TAKE HER HOME - [phone]   FRACTURE SURGERY     ORIF TIBIA FRACTURE Left 07/15/2019   Procedure: OPEN REDUCTION INTERNAL FIXATION (ORIF) TIBIA FRACTURE;  Surgeon: Altamese Dudley, MD;  Location: [place];  Service: Orthopedics;  Laterality: Left;   ORIF TIBIA PLATEAU  Left 07/15/2019   Procedure: OPEN REDUCTION INTERNAL FIXATION (ORIF) TIBIAL PLATEAU;  Surgeon: Altamese Byron, MD;  Location: [place];  Service: Orthopedics;  Laterality: Left;    Social History Social History   Tobacco Use   Smoking status: Never   Smokeless tobacco: Never  Vaping Use   Vaping Use: Never used  Substance Use Topics   Alcohol use: Never   Drug use: Never    Family History Family History  Problem Relation Age of Onset   Asthma Father     Allergies  Allergen Reactions   Beta Adrenergic Blockers Other (See Comments)     REVIEW OF SYSTEMS (Negative unless checked)  Constitutional: [] Weight loss  [] Fever  [] Chills Cardiac: [] Chest pain   [] Chest pressure   [] Palpitations   [] Shortness of breath when laying flat   [] Shortness of breath with exertion. Vascular:  [x] Pain in legs with walking   [] Pain in legs at rest  [] History of DVT   [] Phlebitis   [] Swelling in legs   [] Varicose veins   [] Non-healing ulcers Pulmonary:   [] Uses home oxygen   [] Productive cough   [] Hemoptysis   [] Wheeze  [] COPD   [] Asthma Neurologic:  [] Dizziness   [] Seizures   [] History of stroke   [] History of TIA  [] Aphasia   [] Vissual changes   [] Weakness or numbness in arm   [] Weakness or numbness in leg Musculoskeletal:   [] Joint swelling   [] Joint pain   [] Low back pain Hematologic:  [] Easy bruising  [] Easy bleeding   [] Hypercoagulable state   [] Anemic Gastrointestinal:  [] Diarrhea   [] Vomiting  [x] Gastroesophageal reflux/heartburn   [] Difficulty swallowing. Genitourinary:  [] Chronic kidney disease   [] Difficult urination  [] Frequent urination   [] Blood in urine Skin:  [] Rashes   [] Ulcers  Psychological:  [] History of anxiety   []  History of major depression.  Physical Examination  Vitals:   09/27/22 1130  BP: 125/67  Pulse: 62  Resp: 15  Weight: 102 lb 9.6 oz (46.5 kg)   Body mass index is 18.53 kg/m. Gen: WD/WN, NAD Head: [place]/AT, No temporalis wasting.  Ear/Nose/Throat: Hearing  grossly intact, nares w/o erythema or drainage Eyes: PER, EOMI, sclera nonicteric.  Neck: Supple, no masses.  No bruit or JVD.  Pulmonary:  Good air movement, no audible wheezing, no use of accessory muscles.  Cardiac: RRR, normal S1, S2, no Murmurs. Vascular:  carotid bruit noted Vessel Right Left  Radial Palpable Palpable  Carotid  Palpable  Palpable  Subclav  Palpable Palpable  Gastrointestinal: soft, non-distended. No guarding/no peritoneal signs.  Musculoskeletal: M/S 5/5 throughout.  No visible deformity.  Neurologic: CN 2-12 intact. Pain and light touch intact in extremities.  Symmetrical.  Speech is fluent. Motor exam as listed above. Psychiatric: Judgment intact, Mood & affect appropriate for pt's clinical situation. Dermatologic: No rashes or ulcers noted.  No changes consistent with cellulitis.   CBC Lab Results  Component Value Date   WBC 8.4 11/19/2021   HGB 12.7 11/19/2021   HCT 39.8 11/19/2021   MCV 92.8 11/19/2021   PLT 285 11/19/2021    BMET    Component Value Date/Time  NA 140 11/19/2021 0515   NA 136 12/25/2011 0531   K 3.7 11/19/2021 0515   K 4.1 12/25/2011 0531   CL 105 11/19/2021 0515   CL 108 (H) 12/25/2011 0531   CO2 29 11/19/2021 0515   CO2 21 12/25/2011 0531   GLUCOSE 98 11/19/2021 0515   GLUCOSE 78 12/25/2011 0531   BUN 7 (L) 11/19/2021 0515   BUN 13 12/25/2011 0531   CREATININE 0.81 11/19/2021 0515   CREATININE 1.27 12/25/2011 0531   CALCIUM 9.1 11/19/2021 0515   CALCIUM 8.8 07/17/2019 0233   GFRNONAA >60 11/19/2021 0515   GFRNONAA 44 (L) 12/25/2011 0531   GFRAA 56 (L) 01/22/2020 1127   GFRAA 51 (L) 12/25/2011 0531   CrCl cannot be calculated (Patient's most recent lab result is older than the maximum 21 days allowed.).  COAG Lab Results  Component Value Date   INR 1.0 07/14/2019   INR 1.3 12/25/2011   INR 4.8 (HH) 12/24/2011    Radiology VAS US CAROTID  Result Date: 10/09/2022 Carotid Arterial Duplex Study Patient Name:   Alexis Nichols  Date of Exam:   09/27/2022 Medical Rec #: [id number]      Accession #:    [id number] Date of Birth: 1945-11-03      Patient Gender: F Patient Age:   43 years Exam Location:  [facility] Procedure:      VAS US CAROTID Referring Phys: Hortencia Pilar --------------------------------------------------------------------------------  Indications:       Carotid artery disease. Risk Factors:      Hypertension. Comparison Study:  03/06/2022 Performing Technologist: Almira Coaster RVS  Examination Guidelines: A complete evaluation includes B-mode imaging, spectral Doppler, color Doppler, and power Doppler as needed of all accessible portions of each vessel. Bilateral testing is considered an integral part of a complete examination. Limited examinations for reoccurring indications may be performed as noted.  Right Carotid Findings: +----------+--------+--------+--------+------------------+--------+           PSV cm/sEDV cm/sStenosisPlaque DescriptionComments +----------+--------+--------+--------+------------------+--------+ CCA Prox  80      16                                         +----------+--------+--------+--------+------------------+--------+ CCA Mid   78      16                                         +----------+--------+--------+--------+------------------+--------+ CCA Distal94      25                                         +----------+--------+--------+--------+------------------+--------+ ICA Prox  87      12                                         +----------+--------+--------+--------+------------------+--------+ ICA Mid   175     32                                         +----------+--------+--------+--------+------------------+--------+ ICA [id number]  21                                         +----------+--------+--------+--------+------------------+--------+ ECA       89      12                                          +----------+--------+--------+--------+------------------+--------+ +----------+--------+-------+--------+-------------------+           PSV cm/sEDV cmsDescribeArm Pressure (mmHG) +----------+--------+-------+--------+-------------------+ Subclavian107     0                                  +----------+--------+-------+--------+-------------------+ +---------+--------+--+--------+-+ VertebralPSV cm/s37EDV cm/s8 +---------+--------+--+--------+-+  Left Carotid Findings: +---------+--------+-------+--------+---------------------------------+--------+          PSV cm/sEDV    StenosisPlaque Description               Comments                  cm/s                                                     +---------+--------+-------+--------+---------------------------------+--------+ CCA Prox 94      10                                                       +---------+--------+-------+--------+---------------------------------+--------+ CCA Mid  102     15             heterogenous, irregular and                                               calcific                                  +---------+--------+-------+--------+---------------------------------+--------+ CCA      102     15             heterogenous, irregular and               Distal                          calcific                                  +---------+--------+-------+--------+---------------------------------+--------+ ICA Prox 124     21                                                       +---------+--------+-------+--------+---------------------------------+--------+  ICA Mid  174     21                                                       +---------+--------+-------+--------+---------------------------------+--------+ ICA      122     23                                                       Distal                                                                     +---------+--------+-------+--------+---------------------------------+--------+ ECA      75      0                                                        +---------+--------+-------+--------+---------------------------------+--------+ +----------+--------+--------+--------+-------------------+           PSV cm/sEDV cm/sDescribeArm Pressure (mmHG) +----------+--------+--------+--------+-------------------+ Subclavian195     0                                   +----------+--------+--------+--------+-------------------+ +---------+--------+--+--------+--+ VertebralPSV cm/s64EDV cm/s13 +---------+--------+--+--------+--+   Summary: Right Carotid: Velocities in the right ICA are consistent with a 40-59%                stenosis. Left Carotid: Velocities in the left ICA are consistent with a 40-59% stenosis. Vertebrals:  Bilateral vertebral arteries demonstrate antegrade flow. Subclavians: Normal flow hemodynamics were seen in bilateral subclavian              arteries. *See table(s) above for measurements and observations.  Electronically signed by Hortencia Pilar MD on 10/09/2022 at 4:16:59 PM.    Final      Assessment/Plan 1. Bilateral carotid artery stenosis Recommend:  Given the patient's asymptomatic subcritical stenosis no further invasive testing or surgery at this time.  Duplex ultrasound to 59% stenosis bilaterally but previous study showed a 60 to 79% stenosis of the right ICA.  Given this we will continue to maintain 57-month follow-up  Continue antiplatelet therapy as prescribed Continue management of CAD, HTN and Hyperlipidemia Healthy heart diet,  encouraged exercise at least 4 times per week Follow up in 6 months with duplex ultrasound and physical exam    2. Primary hypertension Continue antihypertensive medications as already ordered, these medications have been reviewed and there are no changes at this time.   3. Gastroesophageal reflux disease, unspecified  whether esophagitis present Continue PPI as already ordered, this medication has been reviewed and there are no changes at this time.  Avoidence of caffeine and alcohol  Moderate elevation of the head of the bed      Arna Medici E  Owens Shark, NP  10/17/2022 12:39 PM

## 2022-10-26 ENCOUNTER — Ambulatory Visit (INDEPENDENT_AMBULATORY_CARE_PROVIDER_SITE_OTHER): Payer: Medicare HMO | Admitting: Podiatry

## 2022-10-26 ENCOUNTER — Encounter: Payer: Self-pay | Admitting: Podiatry

## 2022-10-26 DIAGNOSIS — N183 Chronic kidney disease, stage 3 unspecified: Secondary | ICD-10-CM | POA: Diagnosis not present

## 2022-10-26 DIAGNOSIS — M79675 Pain in left toe(s): Secondary | ICD-10-CM

## 2022-10-26 DIAGNOSIS — M79674 Pain in right toe(s): Secondary | ICD-10-CM | POA: Diagnosis not present

## 2022-10-26 DIAGNOSIS — B351 Tinea unguium: Secondary | ICD-10-CM | POA: Diagnosis not present

## 2022-10-26 NOTE — Progress Notes (Signed)

## 2022-10-30 ENCOUNTER — Other Ambulatory Visit: Payer: Self-pay | Admitting: Unknown Physician Specialty

## 2022-10-30 DIAGNOSIS — J329 Chronic sinusitis, unspecified: Secondary | ICD-10-CM

## 2023-01-29 ENCOUNTER — Ambulatory Visit: Payer: Medicare HMO | Admitting: Podiatry

## 2023-02-08 ENCOUNTER — Other Ambulatory Visit: Payer: Self-pay

## 2023-02-13 ENCOUNTER — Ambulatory Visit: Payer: Medicare HMO | Admitting: Gastroenterology

## 2023-02-13 ENCOUNTER — Encounter: Payer: Self-pay | Admitting: Gastroenterology

## 2023-02-13 ENCOUNTER — Other Ambulatory Visit: Payer: Self-pay

## 2023-02-13 VITALS — BP 130/66 | HR 106 | Temp 97.9°F | Ht 62.4 in | Wt 98.1 lb

## 2023-02-13 DIAGNOSIS — K5909 Other constipation: Secondary | ICD-10-CM

## 2023-02-13 DIAGNOSIS — K2210 Ulcer of esophagus without bleeding: Secondary | ICD-10-CM

## 2023-02-13 NOTE — Progress Notes (Signed)
Arlyss Repress, MD [address]  [address]  [place], [place] [address]  Main: [phone]  Fax: [phone]    Gastroenterology Consultation  Referring Provider:     Leanna Sato, MD Primary Care Physician:  Leanna Sato, MD Primary Gastroenterologist:  Dr. Arlyss Repress Reason for Consultation: Erosive esophagitis, left lower quadrant swelling        HPI:   Alexis Nichols is a 77 y.o. female referred by Dr. Marvis Moeller, Doralee Albino, MD  for consultation & management of left lower quadrant swelling.  Patient reports that for last few months, particularly when she does not have regular bowel movements, notices bulge in left groin area.  She has to take MiraLAX daily in order to keep her bowels regular.  She denies any abdominal bloating.  She is worried about the bulge in left groin area.  She has history of erosive esophagitis for which she is taking Prilosec 40 mg daily.  She denies any symptoms of dysphagia or heartburn.  NSAIDs: None  Antiplts/Anticoagulants/Anti thrombotics: None  GI Procedures:  EGD and colonoscopy 11/18/2021 - LA Grade D reflux and candidiasis esophagitis with no bleeding. Cells for cytology obtained. - Non-bleeding gastric ulcer with a clean ulcer base (Forrest Class III). - Gastric stenosis was found at the pylorus. Dilated to 10 mm. - A few gastric polyps. Biopsied. - Retained gastric fluid.  - Preparation of the colon was fair. - One 15 mm polyp in the sigmoid colon, removed with a hot snare. Resected and retrieved. Clips (MR conditional) were placed. - Severe diverticulosis in the recto-sigmoid colon, in the sigmoid colon and in the descending colon. There was no evidence of diverticular bleeding. - The distal rectum and anal verge are normal on retroflexion view.  DIAGNOSIS:  A. STOMACH, RANDOM; COLD BIOPSY:  - GASTRIC ANTRAL AND OXYNTIC MUCOSA WITH MILD CHRONIC INACTIVE GASTRITIS  AND REACTIVE FOVEOLAR HYPERPLASIA.  - NEGATIVE FOR H. PYLORI,  DYSPLASIA, AND MALIGNANCY.   B. GASTRIC POLYP; COLD BIOPSY:  - FUNDIC GLAND POLYP.  - NEGATIVE FOR H. PYLORI, DYSPLASIA, AND MALIGNANCY.   C. 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  - NO EVIDENCE OF MALIGNANCY.   EGD 01/23/2022 LA grade B erosive esophagitis Normal stomach Normal duodenum  Flexible sigmoidoscopy 01/23/2022 10 mm polyp in the sigmoid colon, removed with hot snare Post polypectomy scar in the sigmoid colon, biopsied Nonbleeding external hemorrhoids  DIAGNOSIS:  A. COLON, SIGMOID, POST POLYPECTOMY SCAR; BIOPSIES:  - COLONIC MUCOSA WITH MILD MUCOSAL HYPERPLASIA AND EDEMA.  - NO EVIDENCE OF SIGNIFICANT ATYPIA OR RESIDUAL POLYP.   B.  COLON, SIGMOID, POLYP, HOT SNARE BIOPSY:  - TUBULOVILLOUS ADENOMA.  - NO EVIDENCE OF HIGH-GRADE DYSPLASIA OR MALIGNANCY.   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(HCC)    CKD (chronic kidney disease)    Fracture of tibial shaft, left, closed 07/14/2019   GERD (gastroesophageal reflux disease)    HTN (hypertension)    Osteoporosis    Vitamin D insufficiency 07/14/2019    Past Surgical History:  Procedure Laterality Date   BRAIN TUMOR EXCISION  11/2018   excision of acoustic neuroma    COLONOSCOPY WITH PROPOFOL N/A 11/18/2021   Procedure: COLONOSCOPY WITH PROPOFOL;  Surgeon: Toney Reil, MD;  Location: [facility] ENDOSCOPY;  Service: Gastroenterology;  Laterality: N/A;   ESOPHAGOGASTRODUODENOSCOPY (EGD) WITH PROPOFOL N/A 11/18/2021   Procedure: ESOPHAGOGASTRODUODENOSCOPY (EGD) WITH PROPOFOL;  Surgeon:  Toney Reil, MD;  Location: [facility] ENDOSCOPY;  Service: Gastroenterology;  Laterality: N/A;   ESOPHAGOGASTRODUODENOSCOPY (EGD) WITH PROPOFOL N/A 01/23/2022   Procedure: ESOPHAGOGASTRODUODENOSCOPY (EGD) WITH PROPOFOL;  Surgeon: Toney Reil, MD;  Location: [facility] ENDOSCOPY;  Service: Gastroenterology;  Laterality: N/A;   EXTERNAL FIXATION  LEG Left 07/12/2019   Procedure: EXTERNAL FIXATION LEG;  Surgeon: Teryl Lucy, MD;  Location: [facility] OR;  Service: Orthopedics;  Laterality: Left;   EXTERNAL FIXATION REMOVAL Left 07/15/2019   Procedure: Removal External Fixation Leg;  Surgeon: Myrene Galas, MD;  Location: [facility] OR;  Service: Orthopedics;  Laterality: Left;   FLEXIBLE SIGMOIDOSCOPY N/A 01/23/2022   Procedure: FLEXIBLE SIGMOIDOSCOPY;  Surgeon: Toney Reil, MD;  Location: [facility] ENDOSCOPY;  Service: Gastroenterology;  Laterality: N/A;  [facility] TRANSPORTATION - 8:15 AM ARRIVAL TERRI BARR WILL MEET HER & TAKE HER HOME - [phone]   FRACTURE SURGERY     ORIF TIBIA FRACTURE Left 07/15/2019   Procedure: OPEN REDUCTION INTERNAL FIXATION (ORIF) TIBIA FRACTURE;  Surgeon: Myrene Galas, MD;  Location: [facility] OR;  Service: Orthopedics;  Laterality: Left;   ORIF TIBIA PLATEAU Left 07/15/2019   Procedure: OPEN REDUCTION INTERNAL FIXATION (ORIF) TIBIAL PLATEAU;  Surgeon: Myrene Galas, MD;  Location: [facility] OR;  Service: Orthopedics;  Laterality: Left;   Current Outpatient Medications:    acetaminophen (TYLENOL) 500 MG tablet, Take 1 tablet (500 mg total) by mouth every 12 (twelve) hours., Disp: 30 tablet, Rfl: 0   aspirin EC 81 MG tablet, Take 1 tablet by mouth daily., Disp: , Rfl:    carboxymethylcellul-glycerin (REFRESH RELIEVA) 0.5-0.9 % ophthalmic solution, Place 1 drop into both eyes as needed for dry eyes., Disp: , Rfl:    cetirizine (ZYRTEC) 10 MG tablet, Take 1 tablet by mouth daily., Disp: , Rfl:    cyanocobalamin (VITAMIN B12) 500 MCG tablet, Take 500 mcg by mouth daily., Disp: , Rfl:    feeding supplement, ENSURE ENLIVE, (ENSURE ENLIVE) LIQD, Take 237 mLs by mouth daily at 8 pm., Disp: 237 mL, Rfl: 12   fluticasone (FLONASE) 50 MCG/ACT nasal spray, Place 2 sprays into both nostrils daily., Disp: , Rfl:    magnesium oxide (MAG-OX) 400 MG tablet, Take 1 tablet by mouth 2 (two) times daily., Disp: , Rfl:    Multiple  Vitamins-Minerals (PRESERVISION AREDS PO), Take by mouth., Disp: , Rfl:    omeprazole (PRILOSEC) 40 MG capsule, Take 40 mg by mouth daily., Disp: , Rfl:    oxybutynin (DITROPAN) 5 MG tablet, Take 1 tablet by mouth 3 (three) times daily., Disp: , Rfl:    polyethylene glycol powder (GLYCOLAX/MIRALAX) 17 GM/SCOOP powder, Take 17 g by mouth daily., Disp: , Rfl:    rosuvastatin (CRESTOR) 5 MG tablet, Take by mouth., Disp: , Rfl:    spironolactone (ALDACTONE) 50 MG tablet, Take 50 mg by mouth daily. , Disp: , Rfl:    triamcinolone cream (KENALOG) 0.5 %, Apply topically., Disp: , Rfl:    White Petrolatum-Mineral Oil (GENTEAL TEARS NIGHT-TIME) OINT, SMARTSIG:0.25 Sparingly In Eye(s) Every Night (Patient not taking: Reported on 02/13/2023), Disp: , Rfl:   Family History  Problem Relation Age of Onset   Asthma Father      Social History   Tobacco Use   Smoking status: Never   Smokeless tobacco: Never  Vaping Use   Vaping status: Never Used  Substance Use Topics   Alcohol use: Never   Drug use: Never    Allergies as of 02/13/2023 - Review Complete 02/13/2023  Allergen Reaction Noted  Beta adrenergic blockers Other (See Comments) 04/20/2015    Review of Systems:    All systems reviewed and negative except where noted in HPI.   Physical Exam:  BP 130/66 (BP Location: Right Arm, Patient Position: Sitting, Cuff Size: Normal)   Pulse (!) 106   Temp 97.9 F (36.6 C) (Oral)   Ht 5' 2.4" (1.585 m)   Wt 98 lb 2 oz (44.5 kg)   BMI 17.72 kg/m  No LMP recorded. Patient is postmenopausal.  General:   Alert, thin built, moderately nourished, pleasant and cooperative in NAD Head:  Normocephalic and atraumatic. Eyes:  Sclera clear, no icterus.   Conjunctiva pink. Ears:  Normal auditory acuity. Nose:  No deformity, discharge, or lesions. Mouth:  No deformity or lesions,oropharynx pink & moist. Neck:  Supple; no masses or thyromegaly. Lungs:  Respirations even and unlabored.  Clear throughout  to auscultation.   No wheezes, crackles, or rhonchi. No acute distress. Heart:  Regular rate and rhythm; no murmurs, clicks, rubs, or gallops. Abdomen:  Normal bowel sounds. Soft, non-tender and non-distended without masses, hepatosplenomegaly or hernias noted.  Mild swelling noted in the left groin area which appears to be a collection of fatty tissue, nontender.  No guarding or rebound tenderness.   Rectal: Not performed Msk:  Symmetrical without gross deformities. Good, equal movement & strength bilaterally. Pulses:  Normal pulses noted. Extremities:  No clubbing or edema.  No cyanosis. Neurologic:  Alert and oriented x3;  grossly normal neurologically. Skin:  Intact without significant lesions or rashes. No jaundice. Psych:  Alert and cooperative. Normal mood and affect.  Imaging Studies: Reviewed  Assessment and Plan:   Alexis Nichols is a 77 y.o. pleasant Caucasian female with history of osteoporosis is seen in consultation for left groin swelling  Left groin swelling, collection of fatty tissue, reassured patient that this is benign and nothing to intervene Advised her to continue taking MiraLAX in order to avoid constipation She continues to eat healthy with high-fiber diet and adequate intake of water  LA grade B erosive esophagitis Recommend repeat upper endoscopy to confirm healing Continue omeprazole 40 mg daily before meals for now  Follow up based on the above workup   Arlyss Repress, MD

## 2023-03-09 ENCOUNTER — Ambulatory Visit: Admission: RE | Admit: 2023-03-09 | Payer: Medicare HMO | Source: Home / Self Care | Admitting: Gastroenterology

## 2023-03-09 ENCOUNTER — Encounter: Admission: RE | Payer: Self-pay | Source: Home / Self Care

## 2023-03-09 SURGERY — ESOPHAGOGASTRODUODENOSCOPY (EGD) WITH PROPOFOL
Anesthesia: General

## 2023-03-27 ENCOUNTER — Other Ambulatory Visit (INDEPENDENT_AMBULATORY_CARE_PROVIDER_SITE_OTHER): Payer: Self-pay | Admitting: Nurse Practitioner

## 2023-03-27 DIAGNOSIS — E785 Hyperlipidemia, unspecified: Secondary | ICD-10-CM | POA: Insufficient documentation

## 2023-03-27 DIAGNOSIS — I6523 Occlusion and stenosis of bilateral carotid arteries: Secondary | ICD-10-CM

## 2023-03-27 NOTE — Progress Notes (Signed)
MRN : [id number]  Alexis Nichols is a 77 y.o. (Aug 13, 1945) female who presents with chief complaint of check carotid arteries.  History of Present Illness:   The patient is seen for evaluation of carotid stenosis. The carotid stenosis was identified after a bruit was identified.   The patient denies amaurosis fugax. There is no recent history of TIA symptoms or focal motor deficits. There is no prior documented CVA.   There is no history of migraine headaches. There is no history of seizures.   The patient is taking enteric-coated aspirin 81 mg daily.   No recent shortening of the patient's walking distance or new symptoms consistent with claudication.  No history of rest pain symptoms. No new ulcers or wounds of the lower extremities have occurred.   There is no history of DVT, PE or superficial thrombophlebitis. No recent episodes of angina or shortness of breath documented.     Stat duplex today shows RICA 40-59% and LICA 1-39%.  Previous carotid duplex showed RICA 60-79% and LICA 40-59%.  No outpatient medications have been marked as taking for the 03/29/23 encounter (Appointment) with Gilda Crease, Latina Craver, MD.    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[facility])    CKD (chronic kidney disease)    Fracture of tibial shaft, left, closed 07/14/2019   GERD (gastroesophageal reflux disease)    HTN (hypertension)    Osteoporosis    Vitamin D insufficiency 07/14/2019    Past Surgical History:  Procedure Laterality Date   BRAIN TUMOR EXCISION  11/2018   excision of acoustic neuroma    COLONOSCOPY WITH PROPOFOL N/A 11/18/2021   Procedure: COLONOSCOPY WITH PROPOFOL;  Surgeon: Toney Reil, MD;  Location: [facility] ENDOSCOPY;  Service: Gastroenterology;  Laterality: N/A;   ESOPHAGOGASTRODUODENOSCOPY (EGD) WITH PROPOFOL N/A 11/18/2021   Procedure: ESOPHAGOGASTRODUODENOSCOPY (EGD) WITH PROPOFOL;  Surgeon: Toney Reil, MD;  Location: [facility]  ENDOSCOPY;  Service: Gastroenterology;  Laterality: N/A;   ESOPHAGOGASTRODUODENOSCOPY (EGD) WITH PROPOFOL N/A 01/23/2022   Procedure: ESOPHAGOGASTRODUODENOSCOPY (EGD) WITH PROPOFOL;  Surgeon: Toney Reil, MD;  Location: [facility] ENDOSCOPY;  Service: Gastroenterology;  Laterality: N/A;   EXTERNAL FIXATION LEG Left 07/12/2019   Procedure: EXTERNAL FIXATION LEG;  Surgeon: Teryl Lucy, MD;  Location: [facility] OR;  Service: Orthopedics;  Laterality: Left;   EXTERNAL FIXATION REMOVAL Left 07/15/2019   Procedure: Removal External Fixation Leg;  Surgeon: Myrene Galas, MD;  Location: [facility] OR;  Service: Orthopedics;  Laterality: Left;   FLEXIBLE SIGMOIDOSCOPY N/A 01/23/2022   Procedure: FLEXIBLE SIGMOIDOSCOPY;  Surgeon: Toney Reil, MD;  Location: [facility] ENDOSCOPY;  Service: Gastroenterology;  Laterality: N/A;  [place] TRANSPORTATION - 8:15 AM ARRIVAL TERRI [facility]ARR WILL MEET HER & TAKE HER HOME - [phone]   FRACTURE SURGERY     ORIF TIBIA FRACTURE Left 07/15/2019   Procedure: OPEN REDUCTION INTERNAL FIXATION (ORIF) TIBIA FRACTURE;  Surgeon: Myrene Galas, MD;  Location: [facility] OR;  Service: Orthopedics;  Laterality: Left;   ORIF TIBIA PLATEAU Left 07/15/2019   Procedure: OPEN REDUCTION INTERNAL FIXATION (ORIF) TIBIAL PLATEAU;  Surgeon: Myrene Galas, MD;  Location: [facility] OR;  Service: Orthopedics;  Laterality: Left;    Social History Social History   Tobacco Use   Smoking status: Never   Smokeless tobacco: Never  Vaping Use   Vaping status: Never Used  Substance Use Topics   Alcohol use:  Never   Drug use: Never    Family History Family History  Problem Relation Age of Onset   Asthma Father     Allergies  Allergen Reactions   Beta Adrenergic Blockers Other (See Comments)     REVIEW OF SYSTEMS (Negative unless checked)  Constitutional: [] Weight loss  [] Fever  [] Chills Cardiac: [] Chest pain   [] Chest pressure   [] Palpitations   [] Shortness of breath when laying flat   [] Shortness of  breath with exertion. Vascular:  [x] Pain in legs with walking   [] Pain in legs at rest  [] History of DVT   [] Phlebitis   [] Swelling in legs   [] Varicose veins   [] Non-healing ulcers Pulmonary:   [] Uses home oxygen   [] Productive cough   [] Hemoptysis   [] Wheeze  [] COPD   [] Asthma Neurologic:  [] Dizziness   [] Seizures   [] History of stroke   [] History of TIA  [] Aphasia   [] Vissual changes   [] Weakness or numbness in arm   [] Weakness or numbness in leg Musculoskeletal:   [] Joint swelling   [] Joint pain   [] Low back pain Hematologic:  [] Easy bruising  [] Easy bleeding   [] Hypercoagulable state   [] Anemic Gastrointestinal:  [] Diarrhea   [] Vomiting  [x] Gastroesophageal reflux/heartburn   [] Difficulty swallowing. Genitourinary:  [] Chronic kidney disease   [] Difficult urination  [] Frequent urination   [] Blood in urine Skin:  [] Rashes   [] Ulcers  Psychological:  [] History of anxiety   []  History of major depression.  Physical Examination  There were no vitals filed for this visit. There is no height or weight on file to calculate BMI. Gen: WD/WN, NAD Head: [place]/AT, No temporalis wasting.  Ear/Nose/Throat: Hearing grossly intact, nares w/o erythema or drainage Eyes: PER, EOMI, sclera nonicteric.  Neck: Supple, no masses.  No bruit or JVD.  Pulmonary:  Good air movement, no audible wheezing, no use of accessory muscles.  Cardiac: RRR, normal S1, S2, no Murmurs. Vascular:  carotid bruit noted Vessel Right Left  Radial Palpable Palpable  Carotid  Palpable  Palpable  Subclav  Palpable Palpable  Gastrointestinal: soft, non-distended. No guarding/no peritoneal signs.  Musculoskeletal: M/S 5/5 throughout.  No visible deformity.  Neurologic: CN 2-12 intact. Pain and light touch intact in extremities.  Symmetrical.  Speech is fluent. Motor exam as listed above. Psychiatric: Judgment intact, Mood & affect appropriate for pt's clinical situation. Dermatologic: No rashes or ulcers noted.  No changes consistent  with cellulitis.   CBC Lab Results  Component Value Date   WBC 8.4 11/19/2021   HGB 12.7 11/19/2021   HCT 39.8 11/19/2021   MCV 92.8 11/19/2021   PLT 285 11/19/2021    BMET    Component Value Date/Time   NA 140 11/19/2021 0515   NA 136 12/25/2011 0531   K 3.7 11/19/2021 0515   K 4.1 12/25/2011 0531   CL 105 11/19/2021 0515   CL 108 (H) 12/25/2011 0531   CO2 29 11/19/2021 0515   CO2 21 12/25/2011 0531   GLUCOSE 98 11/19/2021 0515   GLUCOSE 78 12/25/2011 0531   BUN 7 (L) 11/19/2021 0515   BUN 13 12/25/2011 0531   CREATININE 0.81 11/19/2021 0515   CREATININE 1.27 12/25/2011 0531   CALCIUM 9.1 11/19/2021 0515   CALCIUM 8.8 07/17/2019 0233   GFRNONAA >60 11/19/2021 0515   GFRNONAA 44 (L) 12/25/2011 0531   GFRAA 56 (L) 01/22/2020 1127   GFRAA 51 (L) 12/25/2011 0531   CrCl cannot be calculated (Patient's most recent lab result is older than the  maximum 21 days allowed.).  COAG Lab Results  Component Value Date   INR 1.0 07/14/2019   INR 1.3 12/25/2011   INR 4.8 (HH) 12/24/2011    Radiology No results found.   Assessment/Plan 1. Bilateral carotid artery stenosis Recommend:  Given the patient's asymptomatic subcritical stenosis no further invasive testing or surgery at this time.  Duplex ultrasound shows RICA 40-59% and LICA 1-39% stenosis bilaterally.  Continue antiplatelet therapy as prescribed Continue management of CAD, HTN and Hyperlipidemia Healthy heart diet,  encouraged exercise at least 4 times per week Follow up in 6 months with duplex ultrasound and physical exam  - VAS US CAROTID; Future  2. Coronary artery disease involving native coronary artery of native heart, unspecified whether angina present Continue cardiac and antihypertensive medications as already ordered and reviewed, no changes at this time.  Continue statin as ordered and reviewed, no changes at this time  Nitrates PRN for chest pain  3. Primary hypertension Continue  antihypertensive medications as already ordered, these medications have been reviewed and there are no changes at this time.  4. Gastroesophageal reflux disease, unspecified whether esophagitis present Continue PPI as already ordered, this medication has been reviewed and there are no changes at this time.  Avoidence of caffeine and alcohol  Moderate elevation of the head of the bed   5. Mixed hyperlipidemia Continue statin as ordered and reviewed, no changes at this time    Levora Dredge, MD  03/27/2023 9:12 AM

## 2023-03-29 ENCOUNTER — Ambulatory Visit (INDEPENDENT_AMBULATORY_CARE_PROVIDER_SITE_OTHER): Payer: Medicare HMO

## 2023-03-29 ENCOUNTER — Ambulatory Visit (INDEPENDENT_AMBULATORY_CARE_PROVIDER_SITE_OTHER): Payer: Medicare HMO | Admitting: Vascular Surgery

## 2023-03-29 VITALS — BP 152/63 | HR 49 | Resp 18 | Ht 62.0 in | Wt 100.2 lb

## 2023-03-29 DIAGNOSIS — K219 Gastro-esophageal reflux disease without esophagitis: Secondary | ICD-10-CM

## 2023-03-29 DIAGNOSIS — I2510 Atherosclerotic heart disease of native coronary artery without angina pectoris: Secondary | ICD-10-CM

## 2023-03-29 DIAGNOSIS — I6523 Occlusion and stenosis of bilateral carotid arteries: Secondary | ICD-10-CM

## 2023-03-29 DIAGNOSIS — I10 Essential (primary) hypertension: Secondary | ICD-10-CM | POA: Diagnosis not present

## 2023-03-29 DIAGNOSIS — E782 Mixed hyperlipidemia: Secondary | ICD-10-CM

## 2023-03-31 ENCOUNTER — Encounter (INDEPENDENT_AMBULATORY_CARE_PROVIDER_SITE_OTHER): Payer: Self-pay | Admitting: Vascular Surgery

## 2023-04-03 ENCOUNTER — Ambulatory Visit: Payer: Medicare HMO | Admitting: Gastroenterology

## 2023-09-06 ENCOUNTER — Telehealth: Payer: Self-pay | Admitting: Gastroenterology

## 2023-09-06 NOTE — Telephone Encounter (Signed)
 The patient called in to schedule appointment for Constipation, Acid reflux. I schedule her appointment with Mrs. Gerre Pebbles.

## 2023-09-21 ENCOUNTER — Ambulatory Visit: Payer: Medicare Other | Admitting: Physician Assistant

## 2023-09-26 ENCOUNTER — Other Ambulatory Visit: Payer: Self-pay

## 2023-09-26 ENCOUNTER — Encounter (INDEPENDENT_AMBULATORY_CARE_PROVIDER_SITE_OTHER): Payer: Medicare HMO

## 2023-09-26 ENCOUNTER — Ambulatory Visit (INDEPENDENT_AMBULATORY_CARE_PROVIDER_SITE_OTHER): Payer: Medicare HMO | Admitting: Nurse Practitioner

## 2023-09-27 NOTE — Progress Notes (Signed)
 Celso Amy, PA-C [address]  [address]  [place], [place] [address]  Main: [phone]  Fax: [phone]   Primary Care Physician: Leanna Sato, MD  Primary Gastroenterologist:  Celso Amy, PA-C / Dr. Lannette Donath    CC: Enlarging left inguinal hernia, acid reflux  HPI: Alexis Nichols is a 78 y.o. female, established patient Dr. Allegra Lai, presents to evaluate constipation and acid reflux.  Currently taking omeprazole 40 Mg daily for GERD and MiraLAX 1 capful once daily for constipation.    Current symptoms: Patient states that she is having an enlarging left inguinal hernia.  She feels a bulge in her left groin which is getting larger.  Worse when she stands and goes away when she lays down.  She is taking MiraLAX 1 capful every day with good control of constipation.  Has a soft bowel movement daily with no hard stools or straining.  MiraLAX works well.  She admits to breakthrough heartburn and acid reflux on omeprazole 40 Mg once daily.  She reports increased belching and acidic taste in her throat since January 2025, for 2 or 3 months.  She admits to drinking coffee in the morning.  No other dietary indiscretion.  She denies dysphagia or odynophagia.  She has taken OTC ibuprofen and Pepto-Bismol with little benefit.  She denies abdominal pain, nausea, or vomiting.  11/2021 EGD by Dr. 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  A few benign fundic gland gastric polyps.  Biopsy showed chronic inactive gastritis, negative for H. pylori and dysplasia.  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  Severe left-sided diverticulosis.  01/2022 EGD: LA grade B erosive esophagitis, normal stomach and duodenum.  01/2022 flexible sigmoidoscopy 10 mm tubulovillous adenoma polyp removed from sigmoid colon with no  dysplasia.  3-year repeat colonoscopy will be due 01/2025.  Current Outpatient Medications  Medication Sig Dispense Refill   acetaminophen (TYLENOL) 500 MG tablet Take 1 tablet (500 mg total) by mouth every 12 (twelve) hours. 30 tablet 0   aspirin EC 81 MG tablet Take 1 tablet by mouth daily.     carboxymethylcellul-glycerin (REFRESH RELIEVA) 0.5-0.9 % ophthalmic solution Place 1 drop into both eyes as needed for dry eyes.     cetirizine (ZYRTEC) 10 MG tablet Take 1 tablet by mouth daily.     cyanocobalamin (VITAMIN B12) 500 MCG tablet Take 500 mcg by mouth daily.     famotidine (PEPCID) 20 MG tablet Take 1 tablet (20 mg total) by mouth 2 (two) times daily. 180 tablet 3   feeding supplement, ENSURE ENLIVE, (ENSURE ENLIVE) LIQD Take 237 mLs by mouth daily at 8 pm. 237 mL 12   fluticasone (FLONASE) 50 MCG/ACT nasal spray Place 2 sprays into both nostrils daily.     magnesium oxide (MAG-OX) 400 MG tablet Take 1 tablet by mouth 2 (two) times daily.     Multiple Vitamins-Minerals (PRESERVISION AREDS PO) Take by mouth.     oxybutynin (DITROPAN) 5 MG tablet Take 1 tablet by mouth 3 (three) times daily.     pantoprazole (PROTONIX) 40 MG tablet Take 1 tablet (40 mg total) by mouth daily. 90 tablet 3   polyethylene glycol powder (GLYCOLAX/MIRALAX) 17 GM/SCOOP powder Take 17 g by mouth daily.     spironolactone (ALDACTONE) 50 MG tablet Take 50 mg by mouth daily.  triamcinolone cream (KENALOG) 0.5 % Apply topically.     White Petrolatum-Mineral Oil (GENTEAL TEARS NIGHT-TIME) OINT      rosuvastatin (CRESTOR) 5 MG tablet Take by mouth.     No current facility-administered medications for this visit.    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   Fracture of tibial shaft, left, closed 07/14/2019   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   GERD (gastroesophageal reflux disease)    Hearing loss 12/15/2011   HTN (hypertension)    Hypo-osmolality and hyponatremia 07/18/2013   Occult blood in stools 05/07/2014   Osteoporosis    Other dorsalgia 10/21/2013   Sinus bradycardia 04/20/2015   Unspecified cirrhosis of liver (HCC) 01/17/2012   Vitamin D insufficiency 07/14/2019    Past Surgical History:  Procedure Laterality Date   BRAIN TUMOR EXCISION  11/2018   excision of acoustic neuroma    COLONOSCOPY WITH PROPOFOL N/A 11/18/2021   Procedure: COLONOSCOPY WITH PROPOFOL;  Surgeon: Toney Reil, MD;  Location: [facility] ENDOSCOPY;  Service: Gastroenterology;  Laterality: N/A;   ESOPHAGOGASTRODUODENOSCOPY (EGD) WITH PROPOFOL N/A 11/18/2021   Procedure: ESOPHAGOGASTRODUODENOSCOPY (EGD) WITH PROPOFOL;  Surgeon: Toney Reil, MD;  Location: [facility] ENDOSCOPY;  Service: Gastroenterology;  Laterality: N/A;   ESOPHAGOGASTRODUODENOSCOPY (EGD) WITH PROPOFOL N/A 01/23/2022   Procedure: ESOPHAGOGASTRODUODENOSCOPY (EGD) WITH PROPOFOL;  Surgeon: Toney Reil, MD;  Location: [facility] ENDOSCOPY;  Service: Gastroenterology;  Laterality: N/A;   EXTERNAL FIXATION LEG Left 07/12/2019   Procedure: EXTERNAL FIXATION LEG;  Surgeon: Teryl Lucy, MD;  Location: [facility] OR;  Service: Orthopedics;  Laterality: Left;   EXTERNAL FIXATION REMOVAL Left 07/15/2019   Procedure: Removal External Fixation Leg;  Surgeon: Myrene Galas, MD;  Location: [facility] OR;  Service: Orthopedics;  Laterality: Left;   FLEXIBLE SIGMOIDOSCOPY N/A 01/23/2022   Procedure: FLEXIBLE SIGMOIDOSCOPY;  Surgeon: Toney Reil, MD;  Location: [facility]  ENDOSCOPY;  Service: Gastroenterology;  Laterality: N/A;  [place] TRANSPORTATION - 8:15 AM ARRIVAL TERRI BARR WILL MEET HER & TAKE HER HOME - [phone]   FRACTURE SURGERY     ORIF TIBIA FRACTURE Left 07/15/2019   Procedure: OPEN REDUCTION INTERNAL FIXATION (ORIF) TIBIA FRACTURE;  Surgeon: Myrene Galas, MD;  Location: [facility] OR;  Service: Orthopedics;  Laterality: Left;   ORIF TIBIA PLATEAU Left 07/15/2019   Procedure: OPEN REDUCTION INTERNAL FIXATION (ORIF) TIBIAL PLATEAU;  Surgeon: Myrene Galas, MD;  Location: [facility] OR;  Service: Orthopedics;  Laterality: Left;    Review of Systems:    All systems reviewed and negative except where noted in HPI.   Physical Examination:   BP 101/61   Pulse 72   Temp 98.1 F (36.7 C)   Ht 5\' 2"  (1.575 m)   Wt 103 lb 6.4 oz (46.9 kg)   BMI 18.91 kg/m   General: Well-nourished, well-developed in no acute distress.  Lungs: Clear to auscultation bilaterally. Non-labored. Heart: Regular rate and rhythm, no murmurs rubs or gallops.  Abdomen: Bowel sounds are normal; Abdomen is Soft; No hepatosplenomegaly.  There is a prominent medium size  left inguinal and femoral hernia in the left groin when she is standing.  It spontaneously reduces when she lays down.  The area is not tender.  No Abdominal Tenderness; No guarding or rebound tenderness. Neuro: Alert and oriented x 3.  Grossly intact.  Psych: Alert and cooperative, normal mood and affect.   Imaging Studies: No results found.  Assessment and Plan:   Alexis Nichols is a 78 y.o. y/o female presents for:  Left Inguinal Hernia, Enlarging, reducible; No evidence of obstruction or gangreen. Refer to General Surgeon to discuss repair. I recommended support underwear.  2.  Chronic constipation - Improved and controlled on Miralax.  Continue MiraLAX 1 capful every day.  Continue High fiber diet and 64 ounces fluids daily.  Stop Pepto-Bismol which can cause constipation.  3.  GERD with erosive  esophagitis - Not controlled on Omeprazole 40mg  daily.  Stop omeprazole 40 Mg once daily.  Rx pantoprazole 40 Mg 1 tablet once daily, take 30 minutes before breakfast or dinner.  Rx famotidine 20 Mg 1 tablet twice daily.  Take OTC Tums, Rolaids, or Mylanta as needed.  Recommend Lifestyle Modifications to prevent Acid Reflux.  Rec. Avoid coffee, sodas, peppermint, garlic, onions, alcohol, citrus fruits, chocolate, tomatoes, fatty and spicey foods.  Avoid eating 2-3 hours before bedtime.     4.  History of gastric ulcer (H. pylori negative)  Avoid all NSAIDs.  Stop ibuprofen.  Okay to take Tylenol or acetaminophen as needed.  5.  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  Celso Amy, PA-C  Follow up if symptoms worsen or fail to improve.

## 2023-09-28 ENCOUNTER — Ambulatory Visit: Payer: Medicare Other | Admitting: Physician Assistant

## 2023-09-28 ENCOUNTER — Encounter: Payer: Self-pay | Admitting: Physician Assistant

## 2023-09-28 VITALS — BP 101/61 | HR 72 | Temp 98.1°F | Ht 62.0 in | Wt 103.4 lb

## 2023-09-28 DIAGNOSIS — K5909 Other constipation: Secondary | ICD-10-CM | POA: Diagnosis not present

## 2023-09-28 DIAGNOSIS — K2100 Gastro-esophageal reflux disease with esophagitis, without bleeding: Secondary | ICD-10-CM | POA: Diagnosis not present

## 2023-09-28 DIAGNOSIS — K4090 Unilateral inguinal hernia, without obstruction or gangrene, not specified as recurrent: Secondary | ICD-10-CM

## 2023-09-28 DIAGNOSIS — K5904 Chronic idiopathic constipation: Secondary | ICD-10-CM

## 2023-09-28 DIAGNOSIS — Z8711 Personal history of peptic ulcer disease: Secondary | ICD-10-CM | POA: Diagnosis not present

## 2023-09-28 DIAGNOSIS — Z860101 Personal history of adenomatous and serrated colon polyps: Secondary | ICD-10-CM

## 2023-09-28 DIAGNOSIS — K2210 Ulcer of esophagus without bleeding: Secondary | ICD-10-CM

## 2023-09-28 MED ORDER — PANTOPRAZOLE SODIUM 40 MG PO TBEC: 40.0000 mg | DELAYED_RELEASE_TABLET | Freq: Every day | ORAL | 3 refills | Status: AC

## 2023-09-28 MED ORDER — FAMOTIDINE 20 MG PO TABS: 20.0000 mg | ORAL_TABLET | Freq: Two times a day (BID) | ORAL | 3 refills | Status: AC

## 2023-09-28 NOTE — Patient Instructions (Addendum)
 I recommend that you try wearing Support Underwear for Inguinal Hernia.  Someone from [facility] will call to schedule an appointment.

## 2023-10-05 ENCOUNTER — Encounter: Payer: Self-pay | Admitting: Surgery

## 2023-10-05 ENCOUNTER — Ambulatory Visit: Admitting: Surgery

## 2023-10-05 VITALS — BP 134/75 | HR 87 | Temp 97.8°F | Ht 62.0 in | Wt 103.0 lb

## 2023-10-05 DIAGNOSIS — K4090 Unilateral inguinal hernia, without obstruction or gangrene, not specified as recurrent: Secondary | ICD-10-CM | POA: Diagnosis not present

## 2023-10-05 NOTE — Patient Instructions (Addendum)
 Groin Hernia (Inguinal Hernia) in Adults: What to Know  A hernia happens when an organ or tissue in your body pushes out through a weak spot in the muscles of your belly. This makes a bulge. A groin hernia is also called an inguinal hernia. It's found in your groin, which is the area where your leg meets your lower belly. This kind of hernia could also be: In your scrotum, if you're female. In the folds of skin around your vagina, if you're female. You may be able to push the bulge back into your belly. If you can't push it in and blood flow is cut off to the hernia, you'll need surgery right away. What are the causes? A groin hernia may happen when you strain your belly muscles, such as when you: Lift a heavy object. Strain to poop. Cough. What increases the risk? You may be more likely to get a groin hernia if: You're female. You're 50 years or older. You're pregnant. You've had a groin hernia or belly surgery before. You smoke. You're overweight. You work at a job where you need to stand a lot or lift heavy things. What are the signs or symptoms? Symptoms may depend on how big the hernia is. If it's small, you may not have symptoms. If it's bigger, you may have: A bulge near your groin or genitals. Pain or burning in your groin. A dull ache or feeling of pressure in your groin. If blood flow is cut off to the tissues inside the hernia, you may also: Feel pain and tenderness when you touch the bulge. The skin over it may turn red or purple. Have a fever. Throw up or feel like you may throw up. Have trouble pooping or passing gas. How is this treated? Treatment depends on how big the hernia is and what symptoms you have. You may need: To be watched to see if the bulge grows bigger. Surgery. This may be done if the hernia is big or if you have symptoms. Follow these instructions at home: Lifestyle Ask if it's OK for you to lift. Try not to stand for long periods of time. Do not  smoke, vape, or use nicotine or tobacco. Stay at a healthy weight. Try not to do things that put pressure on your hernia. Preventing trouble pooping You may need to take these steps to help prevent or treat trouble pooping (constipation): Take medicines to help you poop. Eat foods high in fiber, like beans, whole grains, and fresh fruits and vegetables. Drink more fluids as told. General instructions Try to push the hernia back in place by very gently pressing on it while lying down. Do not try to force it back in if it won't push in easily. Watch your hernia for any changes in: Shape. Size. Color. Take your medicines only as told. Contact a doctor if: You have a fever. You have new symptoms. Your symptoms get worse. You can't poop or pass gas. Get help right away if: Your bulge: Starts to hurt a lot. Changes color. You have sudden pain in your scrotum, or your scrotum changes size. You can't gently push the hernia back in place. You feel like you may vomit, and that feeling does not go away. You keep throwing up or feeling like you need to throw up. These symptoms may be an emergency. Call 911 right away. Do not wait to see if the symptoms will go away. Do not drive yourself to the hospital. This information is  not intended to replace advice given to you by your health care provider. Make sure you discuss any questions you have with your health care provider. Document Revised: 03/01/2023 Document Reviewed: 03/01/2023 Elsevier Patient Education  2024 [employer].

## 2023-10-05 NOTE — Progress Notes (Signed)
 10/05/2023  Reason for Visit:  Left inguinal hernia  Requesting Provider:  Celso Amy, PA-C  History of Present Illness: Alexis Nichols is a 78 y.o. female presenting for evaluation of a left inguinal hernia.  The patient last saw Dr. Tonna Boehringer for this on 06/05/23 and at that point, she elected for watchful waiting.  She has been following with GI for GERD and history of esophagitis and saw Ms. Gerre Pebbles on 09/28/23.  The patient reported then that she was feeling the bulge in her left groin and that it was getting larger.  She reports some mild discomfort that is very quick on/off in nature when she coughs or strains.  The hernia has been presents for a few months now.  It reduces on its own when she lies down.  Denies any worsening pain, and denies any nausea/vomiting.  Denies any episodes of severe pain or significant swelling/firmness in the groin.  She does have a history of constipation and this has been improving with Miralax.  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   GERD (gastroesophageal reflux disease)    Hearing loss 12/15/2011   HTN (hypertension)    Hypo-osmolality and hyponatremia 07/18/2013   Occult blood in stools 05/07/2014   Osteoporosis    Other dorsalgia 10/21/2013   Sinus bradycardia 04/20/2015   Unspecified cirrhosis of liver (HCC) 01/17/2012   Vitamin D insufficiency 07/14/2019     Past Surgical History: Past Surgical History:  Procedure Laterality Date   BRAIN TUMOR EXCISION  11/2018   excision of  acoustic neuroma    COLONOSCOPY WITH PROPOFOL N/A 11/18/2021   Procedure: COLONOSCOPY WITH PROPOFOL;  Surgeon: Toney Reil, MD;  Location: [facility] ENDOSCOPY;  Service: Gastroenterology;  Laterality: N/A;   ESOPHAGOGASTRODUODENOSCOPY (EGD) WITH PROPOFOL N/A 11/18/2021   Procedure: ESOPHAGOGASTRODUODENOSCOPY (EGD) WITH PROPOFOL;  Surgeon: Toney Reil, MD;  Location: [facility] ENDOSCOPY;  Service: Gastroenterology;  Laterality: N/A;   ESOPHAGOGASTRODUODENOSCOPY (EGD) WITH PROPOFOL N/A 01/23/2022   Procedure: ESOPHAGOGASTRODUODENOSCOPY (EGD) WITH PROPOFOL;  Surgeon: Toney Reil, MD;  Location: [facility] ENDOSCOPY;  Service: Gastroenterology;  Laterality: N/A;   EXTERNAL FIXATION LEG Left 07/12/2019   Procedure: EXTERNAL FIXATION LEG;  Surgeon: Teryl Lucy, MD;  Location: [facility] OR;  Service: Orthopedics;  Laterality: Left;   EXTERNAL FIXATION REMOVAL Left 07/15/2019   Procedure: Removal External Fixation Leg;  Surgeon: Myrene Galas, MD;  Location: [facility] OR;  Service: Orthopedics;  Laterality: Left;   FLEXIBLE SIGMOIDOSCOPY N/A 01/23/2022   Procedure: FLEXIBLE SIGMOIDOSCOPY;  Surgeon: Toney Reil, MD;  Location: [facility] ENDOSCOPY;  Service: Gastroenterology;  Laterality: N/A;  [place] TRANSPORTATION - 8:15 AM ARRIVAL TERRI BARR WILL MEET HER & TAKE HER HOME - [phone]   FRACTURE SURGERY     ORIF TIBIA FRACTURE Left 07/15/2019   Procedure: OPEN REDUCTION INTERNAL FIXATION (ORIF) TIBIA FRACTURE;  Surgeon: Myrene Galas, MD;  Location: [facility] OR;  Service: Orthopedics;  Laterality: Left;   ORIF TIBIA PLATEAU Left 07/15/2019   Procedure: OPEN REDUCTION INTERNAL FIXATION (ORIF) TIBIAL PLATEAU;  Surgeon: Myrene Galas, MD;  Location:  [facility] OR;  Service: Orthopedics;  Laterality: Left;    Home Medications: Prior to Admission medications   Medication Sig Start Date End Date Taking? Authorizing Provider  acetaminophen (TYLENOL) 500 MG tablet Take 1 tablet (500 mg total) by mouth every 12  (twelve) hours. 07/24/19  Yes Montez Morita, PA-C  aspirin EC 81 MG tablet Take 1 tablet by mouth daily.   Yes [provider]  carboxymethylcellul-glycerin (REFRESH RELIEVA) 0.5-0.9 % ophthalmic solution Place 1 drop into both eyes as needed for dry eyes. 09/13/22  Yes [provider]  cetirizine (ZYRTEC) 10 MG tablet Take 1 tablet by mouth daily. 10/06/21  Yes [provider]  cyanocobalamin (VITAMIN B12) 500 MCG tablet Take 500 mcg by mouth daily.   Yes [provider]  famotidine (PEPCID) 20 MG tablet Take 1 tablet (20 mg total) by mouth 2 (two) times daily. 09/28/23 09/22/24 Yes Celso Amy, PA-C  feeding supplement, ENSURE ENLIVE, (ENSURE ENLIVE) LIQD Take 237 mLs by mouth daily at 8 pm. 07/24/19  Yes Montez Morita, PA-C  fluticasone [facility]) 50 MCG/ACT nasal spray Place 2 sprays into both nostrils daily. 10/25/22  Yes [provider]  magnesium oxide (MAG-OX) 400 MG tablet Take 1 tablet by mouth 2 (two) times daily. 12/07/21  Yes [provider]  Multiple Vitamins-Minerals (PRESERVISION AREDS PO) Take by mouth.   Yes [provider]  oxybutynin (DITROPAN) 5 MG tablet Take 1 tablet by mouth 3 (three) times daily. 10/11/21  Yes [provider]  pantoprazole (PROTONIX) 40 MG tablet Take 1 tablet (40 mg total) by mouth daily. 09/28/23  Yes Celso Amy, PA-C  polyethylene glycol powder (GLYCOLAX/MIRALAX) 17 GM/SCOOP powder Take 17 g by mouth daily. 11/30/22  Yes [provider]  spironolactone (ALDACTONE) 50 MG tablet Take 50 mg by mouth daily.  01/30/19  Yes [provider]  triamcinolone cream (KENALOG) 0.5 % Apply topically. 06/30/21  Yes [provider]  [place] Ma Oil (GENTEAL TEARS NIGHT-TIME) OINT  02/05/23  Yes [provider]  rosuvastatin (CRESTOR) 5 MG tablet Take by mouth. 01/30/22 02/13/23  [provider]    Allergies: Allergies  Allergen Reactions   Beta Adrenergic  Blockers Other (See Comments)    Social History:  reports that she has never smoked. She has never used smokeless tobacco. She reports that she does not drink alcohol and does not use drugs.   Family History: Family History  Problem Relation Age of Onset   Asthma Father     Review of Systems: Review of Systems  Constitutional:  Negative for chills and fever.  Respiratory:  Negative for shortness of breath.   Cardiovascular:  Negative for chest pain.  Gastrointestinal:  Positive for abdominal pain (mild left groin discomfort) and constipation. Negative for nausea and vomiting.    Physical Exam BP 134/75   Pulse 87   Temp 97.8 F (36.6 C) (Oral)   Ht 5\' 2"  (1.575 m)   Wt 103 lb (46.7 kg)   SpO2 96%   BMI 18.84 kg/m  CONSTITUTIONAL: No acute distress HEENT:  Normocephalic, atraumatic, extraocular motion intact. RESPIRATORY:  Lungs are clear, and breath sounds are equal bilaterally. Normal respiratory effort without pathologic use of accessory muscles. CARDIOVASCULAR: Heart is regular without murmurs, gallops, or rubs. GI: The abdomen is soft, non-distended, non-tender to palpation.  The patient has a reducible left inguinal hernia, without any discomfort when pushing on the groin.  No hernia on the right side. MUSCULOSKELETAL:  Normal muscle strength and  tone in all four extremities.  No peripheral edema or cyanosis.  NEUROLOGIC:  Motor and sensation is grossly normal.  Cranial nerves are grossly intact. PSYCH:  Alert and oriented to person, place and time. Affect is normal.  Laboratory Analysis: No results found for this or any previous visit (from the past 24 hours).  Imaging: No results found.  Assessment and Plan: This is a 78 y.o. female with a left inguinal hernia.  --Discussed with the patient that she does have a left inguinal hernia.  I'm unable to palpate a hernia on the right side.  Discussed with her the different categories of hernias ranging from reducible  to incarcerated to strangulated.  Hers is a reducible hernia and she has very mild to minimal symptoms.  She reports some discomfort when she coughs or sneezes, but is very short lasting.  She denies any prolonged discomfort and denies any episodes of severe pain.  Discussed with her the possibility for surgical repair vs watchful waiting. She would rather continue with watchful waiting.  She is wondering if hernia underwear may help.  Discussed with her that hernia underwear can help apply pressure at the hernia site to prevent as much bulging, but it is not a fix.  She will try the hernia underwear and will follow up with me in a month to assess her symptoms again. --All of her questions have been answered.  I spent 30 minutes dedicated to the care of this patient on the date of this encounter to include pre-visit review of records, face-to-face time with the patient discussing diagnosis and management, and any post-visit coordination of care.   Howie Ill, MD [facility]

## 2023-11-05 ENCOUNTER — Ambulatory Visit: Admitting: Surgery

## 2023-11-05 ENCOUNTER — Encounter: Payer: Self-pay | Admitting: Surgery

## 2023-11-05 VITALS — BP 139/72 | HR 56 | Temp 98.0°F | Ht 62.0 in | Wt 103.4 lb

## 2023-11-05 DIAGNOSIS — K4090 Unilateral inguinal hernia, without obstruction or gangrene, not specified as recurrent: Secondary | ICD-10-CM | POA: Diagnosis not present

## 2023-11-05 NOTE — Patient Instructions (Signed)
 Groin Hernia (Inguinal Hernia) in Adults: What to Know  A hernia happens when an organ or tissue in your body pushes out through a weak spot in the muscles of your belly. This makes a bulge. A groin hernia is also called an inguinal hernia. It's found in your groin, which is the area where your leg meets your lower belly. This kind of hernia could also be: In your scrotum, if you're female. In the folds of skin around your vagina, if you're female. You may be able to push the bulge back into your belly. If you can't push it in and blood flow is cut off to the hernia, you'll need surgery right away. What are the causes? A groin hernia may happen when you strain your belly muscles, such as when you: Lift a heavy object. Strain to poop. Cough. What increases the risk? You may be more likely to get a groin hernia if: You're female. You're 50 years or older. You're pregnant. You've had a groin hernia or belly surgery before. You smoke. You're overweight. You work at a job where you need to stand a lot or lift heavy things. What are the signs or symptoms? Symptoms may depend on how big the hernia is. If it's small, you may not have symptoms. If it's bigger, you may have: A bulge near your groin or genitals. Pain or burning in your groin. A dull ache or feeling of pressure in your groin. If blood flow is cut off to the tissues inside the hernia, you may also: Feel pain and tenderness when you touch the bulge. The skin over it may turn red or purple. Have a fever. Throw up or feel like you may throw up. Have trouble pooping or passing gas. How is this treated? Treatment depends on how big the hernia is and what symptoms you have. You may need: To be watched to see if the bulge grows bigger. Surgery. This may be done if the hernia is big or if you have symptoms. Follow these instructions at home: Lifestyle Ask if it's OK for you to lift. Try not to stand for long periods of time. Do not  smoke, vape, or use nicotine or tobacco. Stay at a healthy weight. Try not to do things that put pressure on your hernia. Preventing trouble pooping You may need to take these steps to help prevent or treat trouble pooping (constipation): Take medicines to help you poop. Eat foods high in fiber, like beans, whole grains, and fresh fruits and vegetables. Drink more fluids as told. General instructions Try to push the hernia back in place by very gently pressing on it while lying down. Do not try to force it back in if it won't push in easily. Watch your hernia for any changes in: Shape. Size. Color. Take your medicines only as told. Contact a doctor if: You have a fever. You have new symptoms. Your symptoms get worse. You can't poop or pass gas. Get help right away if: Your bulge: Starts to hurt a lot. Changes color. You have sudden pain in your scrotum, or your scrotum changes size. You can't gently push the hernia back in place. You feel like you may vomit, and that feeling does not go away. You keep throwing up or feeling like you need to throw up. These symptoms may be an emergency. Call 911 right away. Do not wait to see if the symptoms will go away. Do not drive yourself to the hospital. This information is  not intended to replace advice given to you by your health care provider. Make sure you discuss any questions you have with your health care provider. Document Revised: 03/01/2023 Document Reviewed: 03/01/2023 Elsevier Patient Education  2024 [employer].

## 2023-11-05 NOTE — Progress Notes (Signed)
 11/05/2023  History of Present Illness: Alexis Nichols is a 78 y.o. female presents for follow up of a left inguinal hernia.  She was last seen on 10/05/23 for this and at the time she was having very mild to minimal symptoms.  She was undecided about the surgery so she presents today for a 1 month follow-up to reassess her symptoms and evaluate.  The patient reports that she has continued to do well and denies any worsening issues.  Reports that mostly she feels a hernia present but denies any particular pain or tenderness.  She reports that it is more noticeable or palpable when she is straining hard or lifting something.  However she has tried to decrease the amount of strain that she does overall.  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   GERD (gastroesophageal reflux disease)    Hearing loss 12/15/2011   HTN (hypertension)    Hypo-osmolality and hyponatremia 07/18/2013   Occult blood in stools 05/07/2014   Osteoporosis    Other dorsalgia 10/21/2013   Sinus bradycardia 04/20/2015   Unspecified cirrhosis of liver (HCC) 01/17/2012   Vitamin D  insufficiency 07/14/2019     Past Surgical History: Past Surgical History:  Procedure Laterality Date   BRAIN TUMOR EXCISION  11/2018   excision of acoustic neuroma    COLONOSCOPY WITH PROPOFOL  N/A 11/18/2021   Procedure: COLONOSCOPY WITH PROPOFOL ;  Surgeon: Selena Daily, MD;  Location: [facility] ENDOSCOPY;  Service: Gastroenterology;  Laterality: N/A;   ESOPHAGOGASTRODUODENOSCOPY  (EGD) WITH PROPOFOL  N/A 11/18/2021   Procedure: ESOPHAGOGASTRODUODENOSCOPY (EGD) WITH PROPOFOL ;  Surgeon: Selena Daily, MD;  Location: [facility] ENDOSCOPY;  Service: Gastroenterology;  Laterality: N/A;   ESOPHAGOGASTRODUODENOSCOPY (EGD) WITH PROPOFOL  N/A 01/23/2022   Procedure: ESOPHAGOGASTRODUODENOSCOPY (EGD) WITH PROPOFOL ;  Surgeon: Selena Daily, MD;  Location: [facility] ENDOSCOPY;  Service: Gastroenterology;  Laterality: N/A;   EXTERNAL FIXATION LEG Left 07/12/2019   Procedure: EXTERNAL FIXATION LEG;  Surgeon: Osa Blase, MD;  Location: [facility] OR;  Service: Orthopedics;  Laterality: Left;   EXTERNAL FIXATION REMOVAL Left 07/15/2019   Procedure: Removal External Fixation Leg;  Surgeon: Hardy Lia, MD;  Location: [facility] OR;  Service: Orthopedics;  Laterality: Left;   FLEXIBLE SIGMOIDOSCOPY N/A 01/23/2022   Procedure: FLEXIBLE SIGMOIDOSCOPY;  Surgeon: Selena Daily, MD;  Location: [facility] ENDOSCOPY;  Service: Gastroenterology;  Laterality: N/A;  [place] TRANSPORTATION - 8:15 AM ARRIVAL TERRI BARR WILL MEET HER & TAKE HER HOME - [phone]   FRACTURE SURGERY     ORIF TIBIA FRACTURE Left 07/15/2019   Procedure: OPEN REDUCTION INTERNAL FIXATION (ORIF) TIBIA FRACTURE;  Surgeon: Hardy Lia, MD;  Location: [facility] OR;  Service: Orthopedics;  Laterality: Left;   ORIF TIBIA PLATEAU Left 07/15/2019   Procedure: OPEN REDUCTION INTERNAL FIXATION (ORIF) TIBIAL PLATEAU;  Surgeon: Hardy Lia, MD;  Location: [facility] OR;  Service: Orthopedics;  Laterality: Left;    Home Medications: Prior to Admission medications   Medication Sig Start Date End Date Taking? Authorizing Provider  acetaminophen  (TYLENOL ) 500 MG tablet Take 1 tablet (500 mg total) by mouth  every 12 (twelve) hours. 07/24/19   Marisela Sicks, PA-C  aspirin EC 81 MG tablet Take 1 tablet by mouth daily.    [provider]  carboxymethylcellul-glycerin (REFRESH RELIEVA) 0.5-0.9 % ophthalmic solution Place 1 drop into both eyes as needed for  dry eyes. 09/13/22   [provider]  cetirizine (ZYRTEC) 10 MG tablet Take 1 tablet by mouth daily. 10/06/21   [provider]  cyanocobalamin (VITAMIN B12) 500 MCG tablet Take 500 mcg by mouth daily.    [provider]  famotidine  (PEPCID ) 20 MG tablet Take 1 tablet (20 mg total) by mouth 2 (two) times daily. 09/28/23 09/22/24  Brigitte Canard, PA-C  feeding supplement, ENSURE ENLIVE, (ENSURE ENLIVE) LIQD Take 237 mLs by mouth daily at 8 pm. 07/24/19   Marisela Sicks, PA-C  fluticasone (FLONASE) 50 MCG/ACT nasal spray Place 2 sprays into both nostrils daily. 10/25/22   [provider]  magnesium  oxide (MAG-OX) 400 MG tablet Take 1 tablet by mouth 2 (two) times daily. 12/07/21   [provider]  Multiple Vitamins-Minerals (PRESERVISION AREDS PO) Take by mouth.    [provider]  oxybutynin  (DITROPAN ) 5 MG tablet Take 1 tablet by mouth 3 (three) times daily. 10/11/21   [provider]  pantoprazole  (PROTONIX ) 40 MG tablet Take 1 tablet (40 mg total) by mouth daily. 09/28/23   Brigitte Canard, PA-C  polyethylene glycol powder (GLYCOLAX /MIRALAX ) 17 GM/SCOOP powder Take 17 g by mouth daily. 11/30/22   [provider]  rosuvastatin (CRESTOR) 5 MG tablet Take by mouth. 01/30/22 02/13/23  [provider]  spironolactone  (ALDACTONE ) 50 MG tablet Take 50 mg by mouth daily.  01/30/19   [provider]  triamcinolone  cream (KENALOG ) 0.5 % Apply topically. 06/30/21   [provider]  White Petrolatum-Mineral Oil (GENTEAL TEARS NIGHT-TIME) OINT  02/05/23   [provider]    Allergies: Allergies  Allergen Reactions   Beta Adrenergic Blockers Other (See Comments)    Review of Systems: Review of Systems  Constitutional:  Negative for chills and fever.  Respiratory:  Negative for shortness of breath.   Cardiovascular:  Negative for chest pain.  Gastrointestinal:  Negative for abdominal pain, nausea and vomiting.     Physical Exam BP 139/72   Pulse (!) 56   Temp 98 F (36.7 C) (Oral)   Ht 5\' 2"  (1.575 m)   Wt 103 lb 6.4 oz (46.9 kg)   SpO2 100%   BMI 18.91 kg/m  CONSTITUTIONAL: No acute distress, well-nourished HEENT:  Normocephalic, atraumatic, extraocular motion intact. RESPIRATORY:  Normal respiratory effort without pathologic use of accessory muscles. CARDIOVASCULAR: Regular rhythm and rate. GI: The abdomen is soft, nondistended, nontender to palpation.  Patient has a reducible left inguinal hernia without any discomfort or tenderness on palpation or deep palpation.  NEUROLOGIC:  Motor and sensation is grossly normal.  Cranial nerves are grossly intact. PSYCH:  Alert and oriented to person, place and time. Affect is normal.  Assessment and Plan: This is a 78 y.o. female with a reducible left inguinal hernia.  - Discussed with the patient that overall her exam is stable.  The patient has remained mild to minimally symptomatic with a left inguinal hernia and denies any changes over the last month since her last visit.  The patient would rather avoid surgery if possible although she is okay with proceeding with surgery if it is needed.  Discussed with her that given the lack of any significant symptoms, I think it would  be appropriate to continue watchful waiting.  Discussed with her again that the natural progression of hernias unfortunately is to get bigger with time and potentially cause symptoms, but this progression is slow. - Discussed with the patient that if she notices any worsening symptoms that she should contact us  so that we can reevaluate her and potentially discuss surgery at that point.  Otherwise for now, she will continue trying to avoid straining or heavy lifting activities. - Follow-up as needed.  I spent 20 minutes dedicated to the care of this patient on the date of this encounter to include pre-visit review of records, face-to-face time with the patient discussing diagnosis  and management, and any post-visit coordination of care.   Marene Shape, MD [facility]

## 2023-12-04 ENCOUNTER — Encounter (INDEPENDENT_AMBULATORY_CARE_PROVIDER_SITE_OTHER): Payer: Self-pay
